# Patient Record
Sex: Female | Born: 1945 | Race: White | Hispanic: No | Smoking: Former smoker
Health system: Southern US, Community
[De-identification: ages and names within clinical notes are randomized; demographics above are authoritative.]

## PROBLEM LIST (undated history)

## (undated) DIAGNOSIS — J40 Bronchitis, not specified as acute or chronic: Secondary | ICD-10-CM

## (undated) DIAGNOSIS — K219 Gastro-esophageal reflux disease without esophagitis: Secondary | ICD-10-CM

## (undated) DIAGNOSIS — H269 Unspecified cataract: Secondary | ICD-10-CM

## (undated) DIAGNOSIS — I10 Essential (primary) hypertension: Secondary | ICD-10-CM

## (undated) DIAGNOSIS — D649 Anemia, unspecified: Secondary | ICD-10-CM

## (undated) DIAGNOSIS — C50919 Malignant neoplasm of unspecified site of unspecified female breast: Secondary | ICD-10-CM

## (undated) DIAGNOSIS — C801 Malignant (primary) neoplasm, unspecified: Secondary | ICD-10-CM

## (undated) DIAGNOSIS — M810 Age-related osteoporosis without current pathological fracture: Secondary | ICD-10-CM

## (undated) DIAGNOSIS — M1990 Unspecified osteoarthritis, unspecified site: Secondary | ICD-10-CM

## (undated) DIAGNOSIS — E785 Hyperlipidemia, unspecified: Secondary | ICD-10-CM

## (undated) HISTORY — DX: Malignant (primary) neoplasm, unspecified: C80.1

## (undated) HISTORY — PX: ESOPHAGOGASTRODUODENOSCOPY: SHX1529

## (undated) HISTORY — PX: OVARIAN CYST REMOVAL: SHX89

## (undated) HISTORY — DX: Hyperlipidemia, unspecified: E78.5

## (undated) HISTORY — PX: TONSILLECTOMY: SUR1361

## (undated) HISTORY — DX: Age-related osteoporosis without current pathological fracture: M81.0

## (undated) HISTORY — DX: Unspecified cataract: H26.9

## (undated) HISTORY — DX: Malignant neoplasm of unspecified site of unspecified female breast: C50.919

## (undated) HISTORY — PX: APPENDECTOMY: SHX54

## (undated) HISTORY — PX: CATARACT EXTRACTION, BILATERAL: SHX1313

---
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
## 1993-10-21 HISTORY — PX: MASTECTOMY: SHX3

## 1995-10-22 HISTORY — PX: BREAST LUMPECTOMY: SHX2

## 2005-12-25 ENCOUNTER — Other Ambulatory Visit: Admission: RE | Admit: 2005-12-25 | Discharge: 2005-12-25 | Payer: Self-pay | Admitting: Obstetrics and Gynecology

## 2005-12-30 ENCOUNTER — Encounter: Admission: RE | Admit: 2005-12-30 | Discharge: 2005-12-30 | Payer: Self-pay | Admitting: Obstetrics and Gynecology

## 2007-01-01 ENCOUNTER — Encounter: Admission: RE | Admit: 2007-01-01 | Discharge: 2007-01-01 | Payer: Self-pay | Admitting: Obstetrics and Gynecology

## 2008-02-09 ENCOUNTER — Encounter: Admission: RE | Admit: 2008-02-09 | Discharge: 2008-02-09 | Payer: Self-pay | Admitting: Obstetrics and Gynecology

## 2009-02-10 ENCOUNTER — Encounter: Admission: RE | Admit: 2009-02-10 | Discharge: 2009-02-10 | Payer: Self-pay | Admitting: Obstetrics and Gynecology

## 2009-03-03 ENCOUNTER — Ambulatory Visit: Payer: Self-pay | Admitting: Obstetrics and Gynecology

## 2009-03-03 ENCOUNTER — Encounter: Payer: Self-pay | Admitting: Obstetrics and Gynecology

## 2009-03-03 ENCOUNTER — Other Ambulatory Visit: Admission: RE | Admit: 2009-03-03 | Discharge: 2009-03-03 | Payer: Self-pay | Admitting: Obstetrics and Gynecology

## 2011-07-24 ENCOUNTER — Other Ambulatory Visit: Payer: Self-pay | Admitting: Obstetrics and Gynecology

## 2011-07-24 DIAGNOSIS — Z9012 Acquired absence of left breast and nipple: Secondary | ICD-10-CM

## 2011-07-24 DIAGNOSIS — Z1231 Encounter for screening mammogram for malignant neoplasm of breast: Secondary | ICD-10-CM

## 2011-08-05 ENCOUNTER — Ambulatory Visit
Admission: RE | Admit: 2011-08-05 | Discharge: 2011-08-05 | Disposition: A | Discharge status: Home / Self Care | Payer: Medicaid Other | Source: Ambulatory Visit | Attending: Obstetrics and Gynecology | Admitting: Obstetrics and Gynecology

## 2011-08-05 DIAGNOSIS — Z1231 Encounter for screening mammogram for malignant neoplasm of breast: Secondary | ICD-10-CM

## 2011-08-05 DIAGNOSIS — Z9012 Acquired absence of left breast and nipple: Secondary | ICD-10-CM

## 2011-08-07 ENCOUNTER — Other Ambulatory Visit: Payer: Self-pay | Admitting: Obstetrics and Gynecology

## 2011-08-07 DIAGNOSIS — R928 Other abnormal and inconclusive findings on diagnostic imaging of breast: Secondary | ICD-10-CM

## 2011-08-23 ENCOUNTER — Ambulatory Visit
Admission: RE | Admit: 2011-08-23 | Discharge: 2011-08-23 | Disposition: A | Discharge status: Home / Self Care | Payer: Medicaid Other | Source: Ambulatory Visit | Attending: Obstetrics and Gynecology | Admitting: Obstetrics and Gynecology

## 2011-08-23 ENCOUNTER — Other Ambulatory Visit: Payer: Self-pay | Admitting: Obstetrics and Gynecology

## 2011-08-23 DIAGNOSIS — R928 Other abnormal and inconclusive findings on diagnostic imaging of breast: Secondary | ICD-10-CM

## 2011-08-27 ENCOUNTER — Ambulatory Visit
Admission: RE | Admit: 2011-08-27 | Discharge: 2011-08-27 | Disposition: A | Discharge status: Home / Self Care | Payer: Medicaid Other | Source: Ambulatory Visit | Attending: Obstetrics and Gynecology | Admitting: Obstetrics and Gynecology

## 2011-08-27 DIAGNOSIS — R928 Other abnormal and inconclusive findings on diagnostic imaging of breast: Secondary | ICD-10-CM

## 2011-08-30 ENCOUNTER — Other Ambulatory Visit: Payer: Self-pay | Admitting: *Deleted

## 2011-08-30 ENCOUNTER — Telehealth: Payer: Self-pay | Admitting: *Deleted

## 2011-08-30 DIAGNOSIS — C50919 Malignant neoplasm of unspecified site of unspecified female breast: Secondary | ICD-10-CM

## 2011-08-30 NOTE — Telephone Encounter (Signed)
Confirmed [facility] appt for 09/04/11.  Gave instructions and contact information to pt if questions should arise.

## 2011-09-04 ENCOUNTER — Ambulatory Visit
Admission: RE | Admit: 2011-09-04 | Discharge: 2011-09-04 | Disposition: A | Discharge status: Home / Self Care | Payer: Medicaid Other | Source: Ambulatory Visit | Attending: Radiation Oncology | Admitting: Radiation Oncology

## 2011-09-04 ENCOUNTER — Ambulatory Visit: Payer: Medicaid Other

## 2011-09-04 ENCOUNTER — Encounter (INDEPENDENT_AMBULATORY_CARE_PROVIDER_SITE_OTHER): Payer: Self-pay | Admitting: Surgery

## 2011-09-04 ENCOUNTER — Encounter: Payer: Self-pay | Admitting: Oncology

## 2011-09-04 ENCOUNTER — Ambulatory Visit (HOSPITAL_BASED_OUTPATIENT_CLINIC_OR_DEPARTMENT_OTHER): Payer: Medicaid Other | Admitting: Oncology

## 2011-09-04 ENCOUNTER — Ambulatory Visit: Payer: Medicare Other | Attending: Surgery | Admitting: Physical Therapy

## 2011-09-04 ENCOUNTER — Ambulatory Visit (HOSPITAL_BASED_OUTPATIENT_CLINIC_OR_DEPARTMENT_OTHER): Payer: Medicaid Other | Admitting: Surgery

## 2011-09-04 ENCOUNTER — Other Ambulatory Visit (HOSPITAL_BASED_OUTPATIENT_CLINIC_OR_DEPARTMENT_OTHER): Payer: Medicaid Other | Admitting: Lab

## 2011-09-04 VITALS — BP 176/99 | HR 73 | Temp 98.1°F | Ht 60.5 in | Wt 154.9 lb

## 2011-09-04 DIAGNOSIS — C50919 Malignant neoplasm of unspecified site of unspecified female breast: Secondary | ICD-10-CM | POA: Insufficient documentation

## 2011-09-04 DIAGNOSIS — Z170 Estrogen receptor positive status [ER+]: Secondary | ICD-10-CM

## 2011-09-04 DIAGNOSIS — R293 Abnormal posture: Secondary | ICD-10-CM | POA: Insufficient documentation

## 2011-09-04 DIAGNOSIS — C50119 Malignant neoplasm of central portion of unspecified female breast: Secondary | ICD-10-CM

## 2011-09-04 DIAGNOSIS — Z1501 Genetic susceptibility to malignant neoplasm of breast: Secondary | ICD-10-CM

## 2011-09-04 DIAGNOSIS — IMO0001 Reserved for inherently not codable concepts without codable children: Secondary | ICD-10-CM | POA: Insufficient documentation

## 2011-09-04 DIAGNOSIS — C50911 Malignant neoplasm of unspecified site of right female breast: Secondary | ICD-10-CM

## 2011-09-04 LAB — COMPREHENSIVE METABOLIC PANEL
ALT: 43 U/L — ABNORMAL HIGH (ref 0–35)
AST: 26 U/L (ref 0–37)
BUN: 16 mg/dL (ref 6–23)
CO2: 24 mEq/L (ref 19–32)
Creatinine, Ser: 0.73 mg/dL (ref 0.50–1.10)
Glucose, Bld: 128 mg/dL — ABNORMAL HIGH (ref 70–99)
Sodium: 138 mEq/L (ref 135–145)
Total Bilirubin: 0.3 mg/dL (ref 0.3–1.2)

## 2011-09-04 LAB — CBC WITH DIFFERENTIAL/PLATELET
Eosinophils Absolute: 0 10*3/uL (ref 0.0–0.5)
HGB: 14.7 g/dL (ref 11.6–15.9)
MCV: 85.8 fL (ref 79.5–101.0)
MONO#: 0.3 10*3/uL (ref 0.1–0.9)
NEUT%: 76.3 % (ref 38.4–76.8)
Platelets: 262 10*3/uL (ref 145–400)
RBC: 4.95 10*6/uL (ref 3.70–5.45)
WBC: 7.5 10*3/uL (ref 3.9–10.3)
lymph#: 1.4 10*3/uL (ref 0.9–3.3)

## 2011-09-04 NOTE — Progress Notes (Signed)
Kendra Schultz is an 65 y.o. female.    Chief Complaint  Patient presents with  . Breast Cancer    HPI: Screening mammogram performed right breast 08/05/2011 showed scattered calcifications. Diagnostic mammogram performed 08/23/2011 showed a 4 mm cluster of calcifications. 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 Invasive component was ER positive at 89%, PR +81% with a proliferative index of 9%. The tumor was negative for HER-2/neu expression with a ratio of 1.73.   History reviewed. No pertinent past medical history. the patient does not have an active primary care Dr. She sees Dr. Eda Paschal for gynecological care. She and her in does perform some basic blood tests. She has not had a colonoscopy or a recent DEXA scan. She is on no active medications apart from multivitamin and some vitamin D. taken  Past Surgical History  Procedure Date  . Breast lumpectomy 1997    right  . 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 This took place in [place]. In 1997 she had a lumpectomy in the right side, and did not having any  adjuvant radiation or chemotherapy.  History reviewed. No pertinent family history. No history of Breat or ovarian cancer, 1 brother with a history of NHL treated at the [facility].  Gynecologic history: G2 P2 and menarche age 18, menopause age 84 with no history of hormone replacement therapy.  Social History:  reports that she has been smoking.  She does not have any smokeless tobacco history on file. She reports that she does not drink alcohol. Her drug history not on file. she has smoked since the age of 65 and her from half to one pack per day. Does not seem alcohol. She has 2 been retarded children ages 55 and 98 she has one sister, mother stools are living at 68. She is originally from [place].  Health  maintenance:  Cholesterol n/a  Bone density n/a  Colonoscopy n/a  (PSA) n/a  (PAP) n/a   Allergies:  Allergies  Allergen Reactions  . Keflex Nausea And Vomiting    No current outpatient prescriptions on file as of 09/04/2011.   No current facility-administered medications on file as of 09/04/2011.    ROS generally feels well. Weight is stable. Appetite good.  Has occasional headaches, some dyspnea on exertion but denies chest denies abdominal pain nausea vomiting or diarrhea  Physical Exam:  Blood pressure 176/99, pulse 73, temperature 98.1 F (36.7 C), height 5' 0.5" (1.537 m), weight 154 lb 14.4 oz (70.262 kg).  Sclerae unicteric Oropharynx clear No peripheral adenopathy Lungs no rales or rhonchi Heart regular rate and rhythm Abd benign MSK no focal spinal tenderness, no peripheral edema Neuro: nonfocal Breasts: Right breast has ecchymoses and a portion of the breast without discrete mass, right axilla negative, status post left mastectomy without evidence of local recurrence CBC Lab Results  Component Value Date   HGB 14.7 09/04/2011   HCT 42.5 09/04/2011   MCV 85.8 09/04/2011   PLT 262 09/04/2011   CMP     Component Value Date/Time   NA 138 09/04/2011 0816   K 3.5 09/04/2011 0816   CL 102 09/04/2011 0816   CO2 24 09/04/2011 0816   GLUCOSE 128* 09/04/2011 0816   BUN 16 09/04/2011 0816   CREATININE 0.73 09/04/2011 0816   CALCIUM 9.7 09/04/2011 0816  PROT 7.7 09/04/2011 0816   ALBUMIN 4.2 09/04/2011 0816   AST 26 09/04/2011 0816   ALT 43* 09/04/2011 0816   ALKPHOS 87 09/04/2011 0816   BILITOT 0.3 09/04/2011 0816     No results found.   Assessment: 66 year old woman with third occurrence of breast cancer now affecting the right breast., This is ER PR positive HER-2 negative. Plan: Patient was seen today by radiation oncology and surgery. Current plan is for this patient to undergo mastectomy. You also had genetic counseling. Based on the results of  the surgery we will discuss with her possible adjuvant treatment the likelihood is that she will not require chemotherapy. An Oncotype test will be done depending on the volume of invasive cancer present . I plan to see her back in followup in approximately a month's time.  [name] MD 09/04/2011, 1:21 PM

## 2011-09-04 NOTE — Progress Notes (Signed)
No chief complaint on file.   HPI Kendra Schultz is a 65 y.o. female.  The patient presents with complaint of right breast cancer.  Pt had right breast pain for a number of weeks prior to mammogram and biopsy.  She denies mass,  Nipple discharge or change to her breast. HPI  No past medical history on file. tobacco abuse  No past surgical history on file.  History of left mastectomy in 1995 History of right breast lumpectomy 1997 no radiation therapy  No family history on file. lymphoma  Social History History  Substance Use Topics  . Smoking status: Not on file  . Smokeless tobacco: Not on file  . Alcohol Use: Not on file    Not on File  No current outpatient prescriptions on file.    Review of Systems Review of Systems  Constitutional: Negative for fever, chills and unexpected weight change.  HENT: Negative for hearing loss, congestion, sore throat, trouble swallowing and voice change.   Eyes: Negative for visual disturbance.  Respiratory: Negative for cough and wheezing.   Cardiovascular: Negative for chest pain, palpitations and leg swelling.  Gastrointestinal: Negative for nausea, vomiting, abdominal pain, diarrhea, constipation, blood in stool, abdominal distention and anal bleeding.  Genitourinary: Negative for hematuria, vaginal bleeding and difficulty urinating.  Musculoskeletal: Negative for arthralgias.  Skin: Negative for rash and wound.  Neurological: Negative for seizures, syncope and headaches.  Hematological: Negative for adenopathy. Does not bruise/bleed easily.  Psychiatric/Behavioral: Negative for confusion.    There were no vitals taken for this visit.  Physical Exam Physical Exam  Constitutional: She is oriented to person, place, and time. She appears well-developed and well-nourished.  HENT:  Head: Normocephalic and atraumatic.  Nose: Nose normal.  Eyes: EOM are normal. Pupils are equal, round, and reactive to light.  Neck: Normal range of  motion. Neck supple.  Cardiovascular: Normal rate, regular rhythm and normal heart sounds.  Exam reveals no gallop and no friction rub.   No murmur heard. Pulmonary/Chest: Effort normal and breath sounds normal. No stridor.       Left breast surgically absent.  No mass.Right breast with scar adjacent to nipple. Mass under right nipple.  No axillary adenopathy bilaterally.  Abdominal: Soft. Normal appearance.  Lymphadenopathy:    She has no cervical adenopathy.  Neurological: She is alert and oriented to person, place, and time. No cranial nerve deficit. GCS eye subscore is 4. GCS verbal subscore is 5. GCS motor subscore is 6.  Skin: Skin is warm and dry.       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  She has no interest in reconstruction at this time.  Discussed smoking cessation.  Breast conservation discussed as well. The surgical and non surgical options have been discussed with the patient.  Risks of surgery include bleeding,  Infection,  Flap necrosis,  Tissue loss,  Chronic pain, death, Numbness,  And the need for additional procedures.  Reconstruction options also have been discussed with the patient as well.  The patient agrees to proceed.Sentinel lymph node mapping and dissection has been discussed with the patient.  Risk of bleeding,  Infection,  Seroma formation,  Additional procedures,,    Shoulder weakness ,  Shoulder stiffness,  Nerve and blood vessel injury and reaction to the mapping dyes have been discussed.  Alternatives to surgery have been discussed with the patient.  The patient agrees to proceed.       Kentarius Partington A. 09/04/2011, 9:31 AM    

## 2011-09-04 NOTE — Patient Instructions (Signed)
Mastectomy, With or Without Reconstruction Mastectomy (removal of the breast) is a procedure most commonly used to treat cancer (tumor) of the breast. Different procedures are available for treatment. This depends on the stage of the tumor (abnormal growths). Discuss this with your caregiver, surgeon (a specialist for performing operations such as this), or oncologist (someone specialized in the treatment of cancer). With proper information, you can decide which treatment is best for you. Although the sound of the word cancer is frightening to all of us, the new treatments and medications can be a source of reassurance and comfort. If there are things you are worried about, discuss them with your caregiver. He or she can help comfort you and your family. Some of the different procedures for treating breast cancer are:  Radical (extensive) mastectomy. This is an operation used to remove the entire breast, the muscles under the breast, and all of the glands (lymph nodes) under the arm. With all of the new treatments available for cancer of the breast, this procedure has become less common.   Modified radical mastectomy. This is a similar operation to the radical mastectomy described above. In the modified radical mastectomy, the muscles of the chest wall are not removed unless one of the lessor muscles is removed. One of the lessor muscles may be removed to allow better removal of the lymph nodes. The axillary lymph nodes are also removed. Rarely, during an axillary node dissection nerves to this area are damaged. Radiation therapy is then often used to the area following this surgery.   A total mastectomy also known as a complete or simple mastectomy. It involves removal of only the breast. The lymph nodes and the muscles are left in place.   In a lumpectomy, the lump is removed from the breast. This is the simplest form of surgical treatment. A sentinel lymph node biopsy may also be done. Additional  treatment may be required.  RISKS AND COMPLICATIONS The main problems that follow removal of the breast include:  Infection (germs start growing in the wound). This can usually be treated with antibiotics (medications that kill germs).   Lymphedema. This means the arm on the side of the breast that was operated on swells because the lymph (tissue fluid) cannot follow the main channels back into the body. This only occurs when the lymph nodes have had to be removed under the arm.   There may be some areas of numbness to the upper arm and around the incision (cut by the surgeon) in the breast. This happens because of the cutting of or damage to some of the nerves in the area. This is most often unavoidable.   There may be difficultymoving the arm in a full range of motion (moving in all directions) following surgery. This usually improves with time following use and exercise.   Recurrence of breast cancer may happen with the very best of surgery and follow up treatment. Sometimes small cancer cells that cannot be seen with the naked eye have already spread at the time of surgery. When this happens other treatment is available. This treatment may be radiation, medications or a combination of both.  RECONSTRUCTION Reconstruction of the breast may be done immediately if there is not going to be post-operative radiation. This surgery is done for cosmetic (improve appearance) purposes to improve the physical appearance after the operation. This may be done in two ways:  It can be done using a saline filled prosthetic (an artificial breast which is filled   with salt water). Silicone breast implants are now re-approved by the FDA and are being commonly used.   Reconstruction can be done using the body's own muscle/fat/skin.  Your caregiver will discuss your options with you. Depending upon your needs or choice, together you will be able to determine which procedure is best for you. Document Released:  07/02/2001 Document Revised: 06/19/2011 Document Reviewed: 02/23/2008 Exit[employer] Patient Information 2012 [employer]. 

## 2011-09-05 ENCOUNTER — Other Ambulatory Visit (INDEPENDENT_AMBULATORY_CARE_PROVIDER_SITE_OTHER): Payer: Self-pay | Admitting: Surgery

## 2011-09-05 ENCOUNTER — Encounter: Payer: Self-pay | Admitting: *Deleted

## 2011-09-05 DIAGNOSIS — C50911 Malignant neoplasm of unspecified site of right female breast: Secondary | ICD-10-CM

## 2011-09-05 NOTE — Pre-Procedure Instructions (Signed)
20 Kendra Schultz  09/05/2011   Your procedure is scheduled on:09-17-2011  Report to [facility] at 10:15AM.  Call this number if you have problems the morning of surgery: [phone]   Remember:   Do not eat food:After Midnight.  Do not drink clear liquids: 4 Hours before arrival. MAY HAVE WATER,SODA,TEA,BROTH,APPLE JUICE, GRAPE JUICE UNTIL 6:15 AM  Take these medicines the morning of surgery with A SIP OF WATER: NONE   Do not wear jewelry, make-up or nail polish.  Do not wear lotions, powders, or perfumes. You may wear deodorant.  Do not shave 48 hours prior to surgery.  Do not bring valuables to the hospital.  Contacts, dentures or bridgework may not be worn into surgery.  Leave suitcase in the car. After surgery it may be brought to your room.  For patients admitted to the hospital, checkout time is 11:00 AM the day of discharge.   Patients discharged the day of surgery will not be allowed to drive home.  Name and phone number of your driver:  Special Instructions: CHG Shower Use Special Wash: 1/2 bottle night before surgery and 1/2 bottle morning of surgery.   Please read over the following fact sheets that you were given: Pain Booklet, MRSA Information and Surgical Site Infection Prevention

## 2011-09-05 NOTE — Pre-Procedure Instructions (Signed)
20 Kendra Schultz  09/05/2011   Your procedure is scheduled on: 09-17-2011  Report to [facility] at 10:15 AM  Call this number if you have problems the morning of surgery: [phone]   Remember:   Do not eat food:After Midnight.  Do not drink clear liquids: 4 Hours before arrival.  Take these medicines the morning of surgery with A SIP OF WATER: none   Do not wear jewelry, make-up or nail polish.  Do not wear lotions, powders, or perfumes. You may wear deodorant.  Do not shave 48 hours prior to surgery.  Do not bring valuables to the hospital.  Contacts, dentures or bridgework may not be worn into surgery.  Leave suitcase in the car. After surgery it may be brought to your room.  For patients admitted to the hospital, checkout time is 11:00 AM the day of discharge.   Patients discharged the day of surgery will not be allowed to drive home.  Name and phone number of your driver:   Special Instructions: CHG Shower Use Special Wash: 1/2 bottle night before surgery and 1/2 bottle morning of surgery.   Please read over the following fact sheets that you were given: Pain Booklet, MRSA Information and Surgical Site Infection Prevention

## 2011-09-05 NOTE — Progress Notes (Signed)
Mailed after appt letter to pt. 

## 2011-09-06 ENCOUNTER — Other Ambulatory Visit (HOSPITAL_COMMUNITY): Payer: Medicaid Other

## 2011-09-06 ENCOUNTER — Other Ambulatory Visit (INDEPENDENT_AMBULATORY_CARE_PROVIDER_SITE_OTHER): Payer: Self-pay | Admitting: Surgery

## 2011-09-06 ENCOUNTER — Encounter (HOSPITAL_COMMUNITY)
Admission: RE | Admit: 2011-09-06 | Discharge: 2011-09-06 | Disposition: A | Discharge status: Home / Self Care | Payer: Medicare Other | Source: Ambulatory Visit | Attending: Surgery | Admitting: Surgery

## 2011-09-06 ENCOUNTER — Encounter (HOSPITAL_COMMUNITY): Payer: Self-pay

## 2011-09-06 ENCOUNTER — Encounter: Payer: Self-pay | Admitting: *Deleted

## 2011-09-06 ENCOUNTER — Ambulatory Visit: Payer: Medicare Other

## 2011-09-06 ENCOUNTER — Other Ambulatory Visit: Payer: Self-pay

## 2011-09-06 HISTORY — DX: Gastro-esophageal reflux disease without esophagitis: K21.9

## 2011-09-06 HISTORY — DX: Essential (primary) hypertension: I10

## 2011-09-06 HISTORY — DX: Unspecified osteoarthritis, unspecified site: M19.90

## 2011-09-06 HISTORY — DX: Anemia, unspecified: D64.9

## 2011-09-06 LAB — COMPREHENSIVE METABOLIC PANEL
ALT: 36 U/L — ABNORMAL HIGH (ref 0–35)
AST: 22 U/L (ref 0–37)
Albumin: 4.2 g/dL (ref 3.5–5.2)
Alkaline Phosphatase: 87 U/L (ref 39–117)
BUN: 17 mg/dL (ref 6–23)
Chloride: 106 mEq/L (ref 96–112)
Potassium: 3.9 mEq/L (ref 3.5–5.1)
Sodium: 143 mEq/L (ref 135–145)
Total Bilirubin: 0.4 mg/dL (ref 0.3–1.2)
Total Protein: 7.6 g/dL (ref 6.0–8.3)

## 2011-09-06 LAB — SURGICAL PCR SCREEN: Staphylococcus aureus: NEGATIVE

## 2011-09-06 LAB — DIFFERENTIAL
Basophils Relative: 0 % (ref 0–1)
Eosinophils Absolute: 0.1 10*3/uL (ref 0.0–0.7)
Eosinophils Relative: 1 % (ref 0–5)
Lymphs Abs: 1.8 10*3/uL (ref 0.7–4.0)
Monocytes Absolute: 0.5 10*3/uL (ref 0.1–1.0)
Monocytes Relative: 6 % (ref 3–12)
Neutrophils Relative %: 69 % (ref 43–77)

## 2011-09-06 LAB — CBC
HCT: 44.6 % (ref 36.0–46.0)
MCHC: 33.9 g/dL (ref 30.0–36.0)
RDW: 13.7 % (ref 11.5–15.5)
WBC: 8 10*3/uL (ref 4.0–10.5)

## 2011-09-06 IMAGING — CR DG CHEST 2V
2 series · 2 of 2 positions shown · non-contrast
Comparison: None.

CLINICAL DATA: Preoperative respiratory films for patient with
breast cancer.  Smoker.

CHEST - 2 VIEW

[view not recorded (1 of 2)]
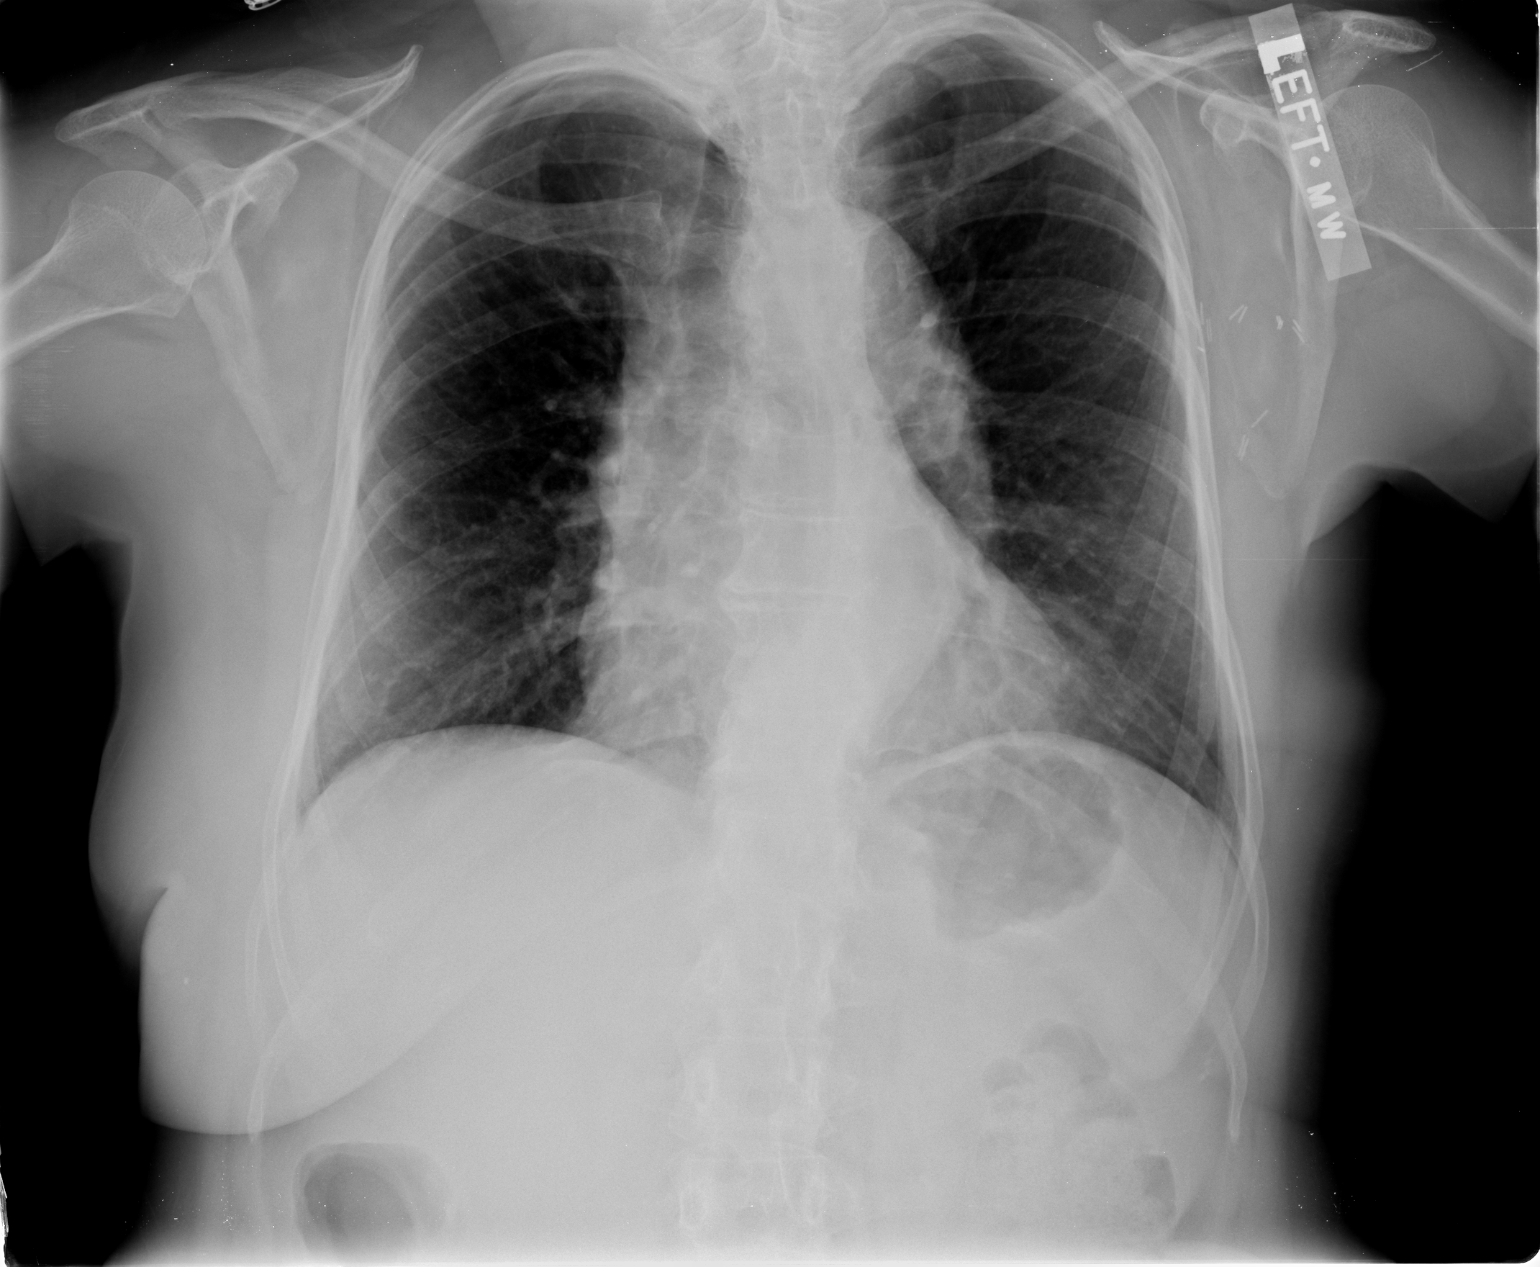

[view not recorded (2 of 2)]
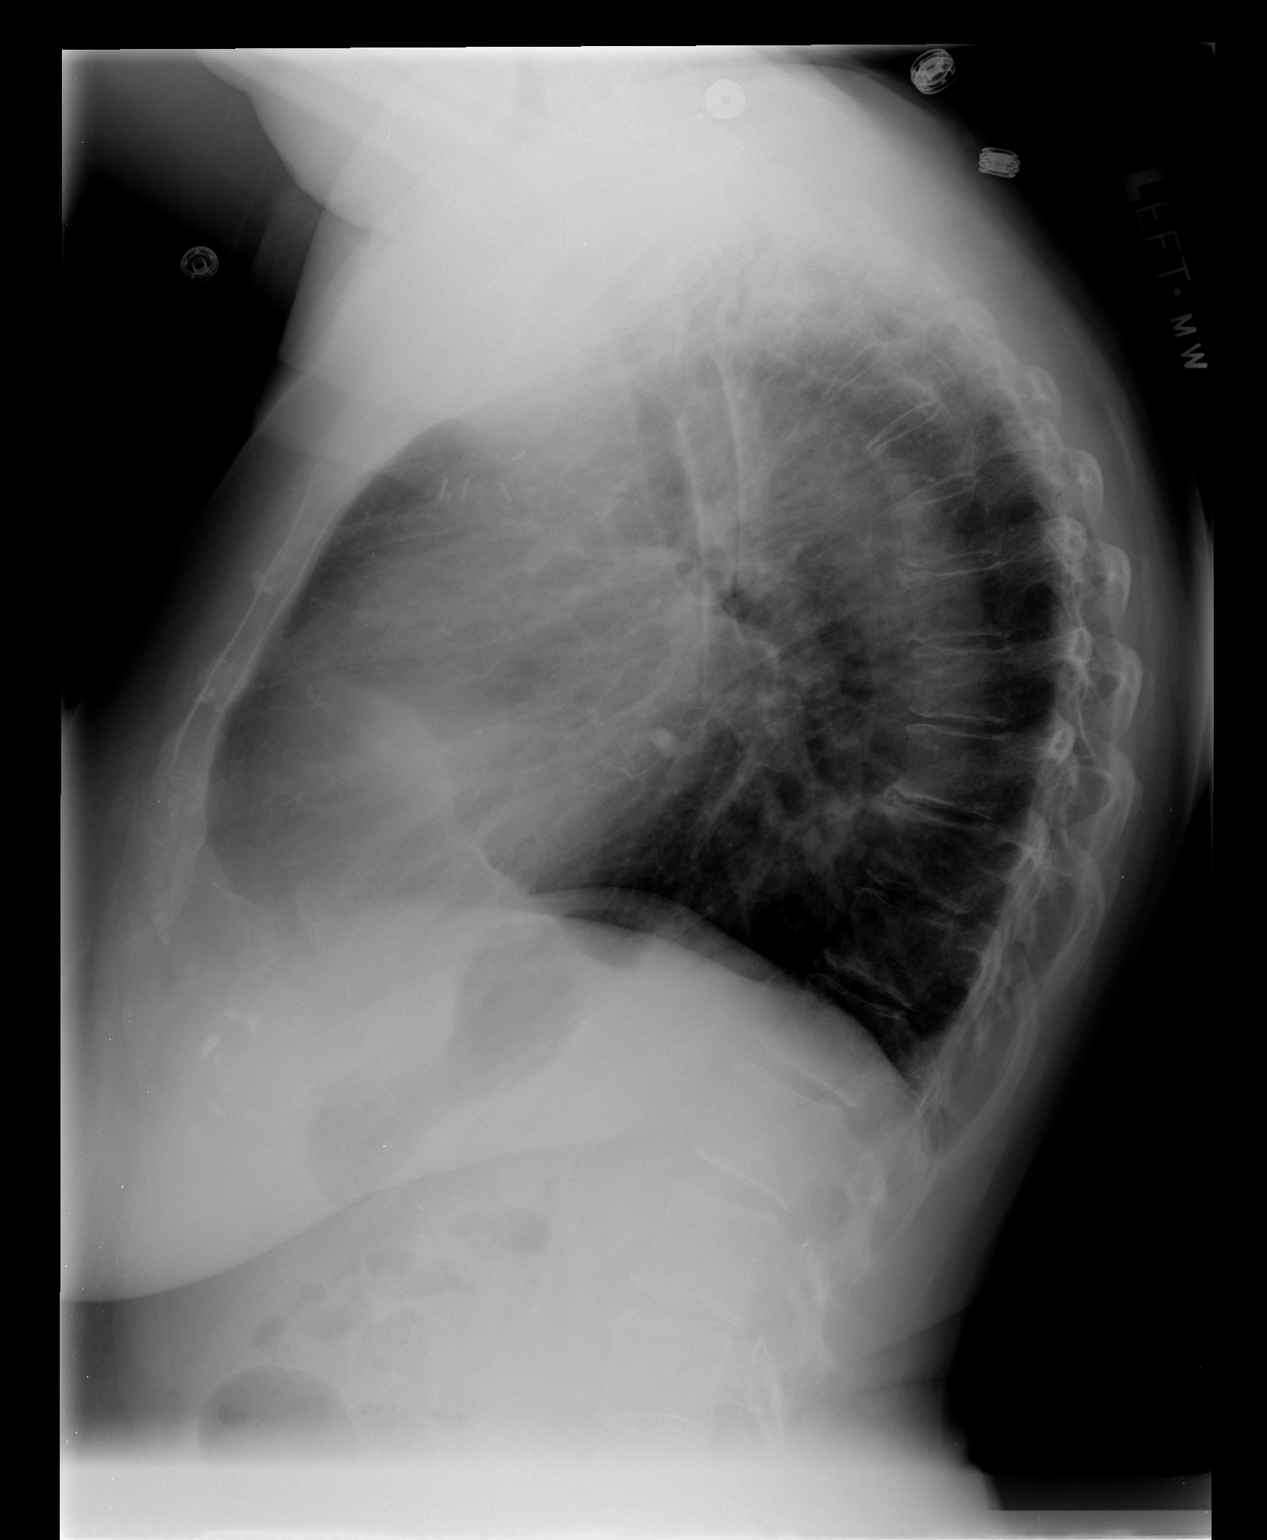

[2 of 2 positions shown; findings below may reference images not displayed]

FINDINGS: The patient is status post left mastectomy and axillary
dissection.  The lungs are clear.  Heart size is normal.  No
pneumothorax or pleural effusion.
IMPRESSION: No acute finding.

## 2011-09-06 NOTE — Progress Notes (Signed)
Pt. bp elevated on pre admission visit  164/104  And 152/100 does not have a pcp and is not being treated for HTN>

## 2011-09-06 NOTE — Progress Notes (Signed)
ORDER FOR SENTINEL NODE INJECTION RELEASED BY MISTAKE. DR. Luisa Hart NOTIFIED AND STATED HE WOULD RE-ENTER THE ORDER

## 2011-09-06 NOTE — Progress Notes (Signed)
DR. Luisa Hart  MADE AWARE OF ELEVATED BP, ASK THAT ANESTHESIA REVIEW.

## 2011-09-06 NOTE — Progress Notes (Signed)
Pt. Seen for genetic counseling.  Blood drawn for BRCA 1/2.  Does not have Medicare card yet.  Will FAX me the letter saying she is eligible.  Waiting on that will delay the lab's beginning the test.  Results may not be available by the time of her scheduled mastectomy, Nov 27.

## 2011-09-09 ENCOUNTER — Telehealth: Payer: Self-pay | Admitting: *Deleted

## 2011-09-09 NOTE — Consult Note (Signed)
Anesthesia:  Patient is a 65 year old female for a right simple mastectomy.  Other hx includes GERD, HTN, OA, anemia, and smoking.  She had a PAT visit on 09/06/11.  Her BP was 164/104 and 152/100.  She told her PAT RN that she did not currently have a PCP and was not on any anti-hypertensive agents.  Dr. Luisa Hart was made aware, and asked if I would review her chart.  Her EKG showed LVH but no significant ST abnormalities.  Labs and CXR where both acceptable from an anesthesia standpoint.  I reviewed above with Dr. Chaney Malling.  If Ms. Symmonds presents on the day of surgery with a similar BP reading and is asymptomatic, it is likely that she may proceed to the OR; however, since her surgery is not scheduled until 09/17/11, we are recommending that an effort be made to get her into a PCP for further evaluation and management of her untreated HTN.  If her BP is significantly higher preoperatively this could potentially lead to a delay or cancellation of the procedure. I did send Dr. Luisa Hart a message relaying this via Epic email.  I will attempt to contact the patient next week.

## 2011-09-09 NOTE — Consult Note (Signed)
Anesthesia:  See my consult note from 09/06/11.  I spoke with her today.  She confirms she has no PCP.  She has had problems getting in to a PCP earlier b/c she was on Medicaid only.  She is now on Medicare as well.  She feels her BP does not usually run quite as high as it had during her PAT visit.  She does deny CP, SOB, or significant edema.  She is not very active, but can vacuum without difficulty.  I did discuss the importance of BP management in order to reduce risk of CVA, CAD, and CKD.  She was given the phone number of [facility] and encouraged to f/u with Dr. Luisa Hart if she needs further assistance in establishing a PCP.

## 2011-09-09 NOTE — Telephone Encounter (Signed)
Spoke to pt concerning [facility] from 09/04/11.  Pt denies questions or needs at this time.  Encourage pt to call with concerns.  Received verbal understanding.  Contact information given.

## 2011-09-16 MED ORDER — CIPROFLOXACIN IN D5W 400 MG/200ML IV SOLN
400.0000 mg | INTRAVENOUS | Status: AC
  Administered 2011-09-17: 400 mg via INTRAVENOUS
  Filled 2011-09-16 (×2): qty 200

## 2011-09-16 NOTE — Interval H&P Note (Signed)
History and Physical Interval Note:   09/16/2011   9:49 AM   Kendra Schultz  has presented today for surgery, with the diagnosis of right breast cancer   The various methods of treatment have been discussed with the patient and family. After consideration of risks, benefits and other options for treatment, the patient has consented to  Procedure(s): MASTECTOMY WITH SENTINEL LYMPH NODE BIOPSY as a surgical intervention .  The patients' history has been reviewed, patient examined, no change in status, stable for surgery.  I have reviewed the patients' chart and labs.  Questions were answered to the patient's satisfaction.     Angelik Walls A.  MD

## 2011-09-16 NOTE — H&P (View-Only) (Signed)
No chief complaint on file.   HPI Kendra Schultz is a 65 y.o. female.  The patient presents with complaint of right breast cancer.  Pt had right breast pain for a number of weeks prior to mammogram and biopsy.  She denies mass,  Nipple discharge or change to her breast. HPI  No past medical history on file. tobacco abuse  No past surgical history on file.  History of left mastectomy in 1995 History of right breast lumpectomy 1997 no radiation therapy  No family history on file. lymphoma  Social History History  Substance Use Topics  . Smoking status: Not on file  . Smokeless tobacco: Not on file  . Alcohol Use: Not on file    Not on File  No current outpatient prescriptions on file.    Review of Systems Review of Systems  Constitutional: Negative for fever, chills and unexpected weight change.  HENT: Negative for hearing loss, congestion, sore throat, trouble swallowing and voice change.   Eyes: Negative for visual disturbance.  Respiratory: Negative for cough and wheezing.   Cardiovascular: Negative for chest pain, palpitations and leg swelling.  Gastrointestinal: Negative for nausea, vomiting, abdominal pain, diarrhea, constipation, blood in stool, abdominal distention and anal bleeding.  Genitourinary: Negative for hematuria, vaginal bleeding and difficulty urinating.  Musculoskeletal: Negative for arthralgias.  Skin: Negative for rash and wound.  Neurological: Negative for seizures, syncope and headaches.  Hematological: Negative for adenopathy. Does not bruise/bleed easily.  Psychiatric/Behavioral: Negative for confusion.    There were no vitals taken for this visit.  Physical Exam Physical Exam  Constitutional: She is oriented to person, place, and time. She appears well-developed and well-nourished.  HENT:  Head: Normocephalic and atraumatic.  Nose: Nose normal.  Eyes: EOM are normal. Pupils are equal, round, and reactive to light.  Neck: Normal range of  motion. Neck supple.  Cardiovascular: Normal rate, regular rhythm and normal heart sounds.  Exam reveals no gallop and no friction rub.   No murmur heard. Pulmonary/Chest: Effort normal and breath sounds normal. No stridor.       Left breast surgically absent.  No mass.Right breast with scar adjacent to nipple. Mass under right nipple.  No axillary adenopathy bilaterally.  Abdominal: Soft. Normal appearance.  Lymphadenopathy:    She has no cervical adenopathy.  Neurological: She is alert and oriented to person, place, and time. No cranial nerve deficit. GCS eye subscore is 4. GCS verbal subscore is 5. GCS motor subscore is 6.  Skin: Skin is warm and dry.       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  She has no interest in reconstruction at this time.  Discussed smoking cessation.  Breast conservation discussed as well. The surgical and non surgical options have been discussed with the patient.  Risks of surgery include bleeding,  Infection,  Flap necrosis,  Tissue loss,  Chronic pain, death, Numbness,  And the need for additional procedures.  Reconstruction options also have been discussed with the patient as well.  The patient agrees to proceed.Sentinel lymph node mapping and dissection has been discussed with the patient.  Risk of bleeding,  Infection,  Seroma formation,  Additional procedures,,  Shoulder weakness ,  Shoulder stiffness,  Nerve and blood vessel injury and reaction to the mapping dyes have been discussed.  Alternatives to surgery have been discussed with the patient.  The patient agrees to proceed.       Murriel Holwerda A. 09/04/2011, 9:31 AM

## 2011-09-17 ENCOUNTER — Encounter (HOSPITAL_COMMUNITY)
Admission: RE | Disposition: A | Discharge status: Home / Self Care | Payer: Self-pay | Source: Ambulatory Visit | Attending: Surgery

## 2011-09-17 ENCOUNTER — Encounter (HOSPITAL_COMMUNITY): Payer: Self-pay | Admitting: Surgery

## 2011-09-17 ENCOUNTER — Other Ambulatory Visit (INDEPENDENT_AMBULATORY_CARE_PROVIDER_SITE_OTHER): Payer: Self-pay | Admitting: Surgery

## 2011-09-17 ENCOUNTER — Inpatient Hospital Stay (HOSPITAL_COMMUNITY)
Admission: RE | Admit: 2011-09-17 | Discharge: 2011-09-18 | DRG: 583 | Disposition: A | Discharge status: Home / Self Care | Payer: Medicare Other | Source: Ambulatory Visit | Attending: Surgery | Admitting: Surgery

## 2011-09-17 ENCOUNTER — Encounter (HOSPITAL_COMMUNITY): Payer: Self-pay | Admitting: Vascular Surgery

## 2011-09-17 ENCOUNTER — Inpatient Hospital Stay (HOSPITAL_COMMUNITY): Admission: RE | Admit: 2011-09-17 | Payer: Medicaid Other | Source: Ambulatory Visit

## 2011-09-17 ENCOUNTER — Ambulatory Visit (HOSPITAL_COMMUNITY): Payer: Medicare Other

## 2011-09-17 ENCOUNTER — Encounter (HOSPITAL_COMMUNITY): Payer: Self-pay | Admitting: *Deleted

## 2011-09-17 ENCOUNTER — Ambulatory Visit (HOSPITAL_COMMUNITY): Payer: Medicare Other | Admitting: Vascular Surgery

## 2011-09-17 DIAGNOSIS — Z23 Encounter for immunization: Secondary | ICD-10-CM

## 2011-09-17 DIAGNOSIS — Z01812 Encounter for preprocedural laboratory examination: Secondary | ICD-10-CM

## 2011-09-17 DIAGNOSIS — Z853 Personal history of malignant neoplasm of breast: Secondary | ICD-10-CM

## 2011-09-17 DIAGNOSIS — C50919 Malignant neoplasm of unspecified site of unspecified female breast: Principal | ICD-10-CM | POA: Diagnosis present

## 2011-09-17 DIAGNOSIS — D0590 Unspecified type of carcinoma in situ of unspecified breast: Secondary | ICD-10-CM

## 2011-09-17 HISTORY — PX: MASTECTOMY W/ SENTINEL NODE BIOPSY: SHX2001

## 2011-09-17 LAB — CBC
MCH: 28.6 pg (ref 26.0–34.0)
Platelets: 196 10*3/uL (ref 150–400)
RBC: 3.81 MIL/uL — ABNORMAL LOW (ref 3.87–5.11)
RDW: 13.7 % (ref 11.5–15.5)

## 2011-09-17 LAB — CREATININE, SERUM: Creatinine, Ser: 0.78 mg/dL (ref 0.50–1.10)

## 2011-09-17 SURGERY — MASTECTOMY WITH SENTINEL LYMPH NODE BIOPSY
Anesthesia: General | Site: Breast | Laterality: Right | Wound class: Clean

## 2011-09-17 MED ORDER — NEOSTIGMINE METHYLSULFATE 1 MG/ML IJ SOLN
INTRAMUSCULAR | Status: DC | PRN
  Administered 2011-09-17: 4 mg via INTRAVENOUS

## 2011-09-17 MED ORDER — HETASTARCH-ELECTROLYTES 6 % IV SOLN
INTRAVENOUS | Status: DC | PRN
  Administered 2011-09-17: 13:00:00 via INTRAVENOUS

## 2011-09-17 MED ORDER — LISINOPRIL 10 MG PO TABS
10.0000 mg | ORAL_TABLET | Freq: Every day | ORAL | Status: DC
  Filled 2011-09-17 (×2): qty 1

## 2011-09-17 MED ORDER — KETOROLAC TROMETHAMINE 15 MG/ML IJ SOLN
15.0000 mg | Freq: Four times a day (QID) | INTRAMUSCULAR | Status: DC | PRN
  Administered 2011-09-17 – 2011-09-18 (×2): 15 mg via INTRAVENOUS
  Filled 2011-09-17: qty 1

## 2011-09-17 MED ORDER — TECHNETIUM TC 99M SULFUR COLLOID FILTERED
1.0000 | Freq: Once | INTRAVENOUS | Status: AC | PRN
  Administered 2011-09-17: 1 via INTRADERMAL

## 2011-09-17 MED ORDER — ONDANSETRON HCL 4 MG PO TABS: 4.0000 mg | ORAL_TABLET | Freq: Four times a day (QID) | ORAL | Status: DC | PRN

## 2011-09-17 MED ORDER — HYDROMORPHONE HCL PF 1 MG/ML IJ SOLN
0.2500 mg | INTRAMUSCULAR | Status: DC | PRN
  Administered 2011-09-17 (×2): 0.5 mg via INTRAVENOUS

## 2011-09-17 MED ORDER — FENTANYL CITRATE 0.05 MG/ML IJ SOLN
INTRAMUSCULAR | Status: DC | PRN
  Administered 2011-09-17 (×2): 50 ug via INTRAVENOUS
  Administered 2011-09-17: 150 ug via INTRAVENOUS
  Administered 2011-09-17 (×2): 50 ug via INTRAVENOUS

## 2011-09-17 MED ORDER — LIDOCAINE HCL (CARDIAC) 20 MG/ML IV SOLN
INTRAVENOUS | Status: DC | PRN
  Administered 2011-09-17: 50 mg via INTRAVENOUS

## 2011-09-17 MED ORDER — ONDANSETRON HCL 4 MG/2ML IJ SOLN
INTRAMUSCULAR | Status: DC | PRN
  Administered 2011-09-17: 4 mg via INTRAVENOUS

## 2011-09-17 MED ORDER — MORPHINE SULFATE 2 MG/ML IJ SOLN
2.0000 mg | INTRAMUSCULAR | Status: DC | PRN
  Administered 2011-09-17: 2 mg via INTRAVENOUS
  Filled 2011-09-17: qty 1

## 2011-09-17 MED ORDER — MEPERIDINE HCL 25 MG/ML IJ SOLN: 6.2500 mg | INTRAMUSCULAR | Status: DC | PRN

## 2011-09-17 MED ORDER — SODIUM CHLORIDE 0.9 % IJ SOLN
INTRAMUSCULAR | Status: DC | PRN
  Administered 2011-09-17: 13:00:00 via INTRADERMAL

## 2011-09-17 MED ORDER — SODIUM CHLORIDE 0.9 % IR SOLN
Status: DC | PRN
  Administered 2011-09-17: 1000 mL

## 2011-09-17 MED ORDER — KCL IN DEXTROSE-NACL 20-5-0.45 MEQ/L-%-% IV SOLN
INTRAVENOUS | Status: DC
  Administered 2011-09-17: 18:00:00 via INTRAVENOUS
  Filled 2011-09-17 (×2): qty 1000

## 2011-09-17 MED ORDER — HYDROCHLOROTHIAZIDE 12.5 MG PO CAPS
12.5000 mg | ORAL_CAPSULE | Freq: Every day | ORAL | Status: DC
  Filled 2011-09-17 (×2): qty 1

## 2011-09-17 MED ORDER — LISINOPRIL-HYDROCHLOROTHIAZIDE 10-12.5 MG PO TABS: 1.0000 | ORAL_TABLET | Freq: Every day | ORAL | Status: DC

## 2011-09-17 MED ORDER — GLYCOPYRROLATE 0.2 MG/ML IJ SOLN
INTRAMUSCULAR | Status: DC | PRN
  Administered 2011-09-17: .7 mg via INTRAVENOUS
  Administered 2011-09-17: 0.2 mg via INTRAVENOUS

## 2011-09-17 MED ORDER — PNEUMOCOCCAL VAC POLYVALENT 25 MCG/0.5ML IJ INJ
0.5000 mL | INJECTION | INTRAMUSCULAR | Status: DC
  Filled 2011-09-17: qty 0.5

## 2011-09-17 MED ORDER — ONDANSETRON HCL 4 MG/2ML IJ SOLN: 4.0000 mg | Freq: Once | INTRAMUSCULAR | Status: DC | PRN

## 2011-09-17 MED ORDER — LACTATED RINGERS IV SOLN
INTRAVENOUS | Status: DC | PRN
  Administered 2011-09-17 (×2): via INTRAVENOUS

## 2011-09-17 MED ORDER — PROPOFOL 10 MG/ML IV EMUL
INTRAVENOUS | Status: DC | PRN
  Administered 2011-09-17: 200 mg via INTRAVENOUS

## 2011-09-17 MED ORDER — MORPHINE SULFATE 2 MG/ML IJ SOLN: 0.0500 mg/kg | INTRAMUSCULAR | Status: DC | PRN

## 2011-09-17 MED ORDER — ROCURONIUM BROMIDE 100 MG/10ML IV SOLN
INTRAVENOUS | Status: DC | PRN
  Administered 2011-09-17: 35 mg via INTRAVENOUS

## 2011-09-17 MED ORDER — OXYCODONE-ACETAMINOPHEN 5-325 MG PO TABS
2.0000 | ORAL_TABLET | ORAL | Status: DC | PRN
  Administered 2011-09-17 – 2011-09-18 (×2): 2 via ORAL
  Filled 2011-09-17 (×2): qty 2

## 2011-09-17 MED ORDER — ONDANSETRON HCL 4 MG/2ML IJ SOLN: 4.0000 mg | Freq: Four times a day (QID) | INTRAMUSCULAR | Status: DC | PRN

## 2011-09-17 MED ORDER — ENOXAPARIN SODIUM 40 MG/0.4ML ~~LOC~~ SOLN
40.0000 mg | SUBCUTANEOUS | Status: DC
  Administered 2011-09-17: 40 mg via SUBCUTANEOUS
  Filled 2011-09-17 (×2): qty 0.4

## 2011-09-17 SURGICAL SUPPLY — 50 items
ADH SKN CLS APL DERMABOND .7 (GAUZE/BANDAGES/DRESSINGS) ×2
APPLIER CLIP 9.375 MED OPEN (MISCELLANEOUS)
APR CLP MED 9.3 20 MLT OPN (MISCELLANEOUS)
BINDER BREAST LRG (GAUZE/BANDAGES/DRESSINGS) IMPLANT
BINDER BREAST XLRG (GAUZE/BANDAGES/DRESSINGS) ×1 IMPLANT
CANISTER SUCTION 2500CC (MISCELLANEOUS) ×2 IMPLANT
CHLORAPREP W/TINT 26ML (MISCELLANEOUS) ×2 IMPLANT
CLIP APPLIE 9.375 MED OPEN (MISCELLANEOUS) IMPLANT
CLOTH BEACON ORANGE TIMEOUT ST (SAFETY) ×2 IMPLANT
CONT SPEC 4OZ CLIKSEAL STRL BL (MISCELLANEOUS) ×2 IMPLANT
COVER PROBE W GEL 5X96 (DRAPES) ×2 IMPLANT
COVER SURGICAL LIGHT HANDLE (MISCELLANEOUS) ×3 IMPLANT
DERMABOND ADVANCED (GAUZE/BANDAGES/DRESSINGS) ×2
DERMABOND ADVANCED .7 DNX12 (GAUZE/BANDAGES/DRESSINGS) ×1 IMPLANT
DRAIN CHANNEL 19F RND (DRAIN) ×2 IMPLANT
DRAPE LAPAROSCOPIC ABDOMINAL (DRAPES) ×2 IMPLANT
DRAPE UTILITY 15X26 W/TAPE STR (DRAPE) ×4 IMPLANT
ELECT CAUTERY BLADE 6.4 (BLADE) ×2 IMPLANT
ELECT REM PT RETURN 9FT ADLT (ELECTROSURGICAL) ×2
ELECTRODE REM PT RTRN 9FT ADLT (ELECTROSURGICAL) ×1 IMPLANT
EVACUATOR SILICONE 100CC (DRAIN) ×2 IMPLANT
GAUZE SPONGE 2X2 8PLY STRL LF (GAUZE/BANDAGES/DRESSINGS) IMPLANT
GLOVE BIO SURGEON STRL SZ7 (GLOVE) ×2 IMPLANT
GLOVE BIO SURGEON STRL SZ8 (GLOVE) ×2 IMPLANT
GLOVE BIOGEL PI IND STRL 8 (GLOVE) ×1 IMPLANT
GLOVE BIOGEL PI INDICATOR 8 (GLOVE) ×1
GLOVE SS BIOGEL STRL SZ 7 (GLOVE) IMPLANT
GLOVE SUPERSENSE BIOGEL SZ 7 (GLOVE) ×1
GOWN PREVENTION PLUS XLARGE (GOWN DISPOSABLE) ×2 IMPLANT
GOWN STRL NON-REIN LRG LVL3 (GOWN DISPOSABLE) ×4 IMPLANT
KIT BASIN OR (CUSTOM PROCEDURE TRAY) ×2 IMPLANT
KIT ROOM TURNOVER OR (KITS) ×2 IMPLANT
NDL 18GX1X1/2 (RX/OR ONLY) (NEEDLE) ×1 IMPLANT
NDL HYPO 25GX1X1/2 BEV (NEEDLE) ×1 IMPLANT
NEEDLE 18GX1X1/2 (RX/OR ONLY) (NEEDLE) ×2 IMPLANT
NEEDLE HYPO 25GX1X1/2 BEV (NEEDLE) ×2 IMPLANT
NS IRRIG 1000ML POUR BTL (IV SOLUTION) ×2 IMPLANT
PACK GENERAL/GYN (CUSTOM PROCEDURE TRAY) ×2 IMPLANT
PAD ARMBOARD 7.5X6 YLW CONV (MISCELLANEOUS) ×2 IMPLANT
PEN SKIN MARKING BROAD (MISCELLANEOUS) ×2 IMPLANT
SPECIMEN JAR LARGE (MISCELLANEOUS) ×2 IMPLANT
SPONGE GAUZE 2X2 STER 10/PKG (GAUZE/BANDAGES/DRESSINGS) ×1
STAPLER VISISTAT 35W (STAPLE) ×2 IMPLANT
SUT ETHILON 3 0 FSL (SUTURE) ×2 IMPLANT
SUT MNCRL AB 4-0 PS2 18 (SUTURE) ×3 IMPLANT
SUT VIC AB 3-0 SH 18 (SUTURE) ×2 IMPLANT
SYR CONTROL 10ML LL (SYRINGE) ×2 IMPLANT
TOWEL OR 17X24 6PK STRL BLUE (TOWEL DISPOSABLE) ×2 IMPLANT
TOWEL OR 17X26 10 PK STRL BLUE (TOWEL DISPOSABLE) ×2 IMPLANT
WATER STERILE IRR 1000ML POUR (IV SOLUTION) IMPLANT

## 2011-09-17 NOTE — Interval H&P Note (Signed)
History and Physical Interval Note:   09/17/2011   11:53 AM   Kendra Schultz  has presented today for surgery, with the diagnosis of right breast cancer   The various methods of treatment have been discussed with the patient and family. After consideration of risks, benefits and other options for treatment, the patient has consented to  Procedure(s): MASTECTOMY WITH SENTINEL LYMPH NODE BIOPSY as a surgical intervention .  The patients' history has been reviewed, patient examined, no change in status, stable for surgery.  I have reviewed the patients' chart and labs.  Questions were answered to the patient's satisfaction.     Ladana Chavero A.  MD

## 2011-09-17 NOTE — Preoperative (Signed)
Beta Blockers   Reason not to administer Beta Blockers:Not Applicable. No home beta blockers 

## 2011-09-17 NOTE — Anesthesia Preprocedure Evaluation (Addendum)
Anesthesia Evaluation  Patient identified by MRN, date of birth, ID band Patient awake    Reviewed: Allergy & Precautions, H&P , NPO status , Patient's Chart, lab work & pertinent test results, reviewed documented beta blocker date and time   History of Anesthesia Complications (+) AWARENESS UNDER ANESTHESIA  Airway Mallampati: II TM Distance: <3 FB Neck ROM: full    Dental No notable dental hx. (+) Missing and Poor Dentition   Pulmonary neg pulmonary ROS,          Cardiovascular hypertension, Pt. on medications regular Normal    Neuro/Psych Negative Neurological ROS     GI/Hepatic Neg liver ROS, GERD-  ,  Endo/Other    Renal/GU negative Renal ROS  Genitourinary negative   Musculoskeletal   Abdominal   Peds  Hematology   Anesthesia Other Findings   Reproductive/Obstetrics                        Anesthesia Physical Anesthesia Plan  ASA: II  Anesthesia Plan: General   Post-op Pain Management:    Induction: Intravenous  Airway Management Planned: Oral ETT  Additional Equipment:   Intra-op Plan:   Post-operative Plan: Extubation in OR  Informed Consent: I have reviewed the patients History and Physical, chart, labs and discussed the procedure including the risks, benefits and alternatives for the proposed anesthesia with the patient or authorized representative who has indicated his/her understanding and acceptance.     Plan Discussed with: CRNA, Anesthesiologist and Surgeon  Anesthesia Plan Comments:         Anesthesia Quick Evaluation

## 2011-09-17 NOTE — Progress Notes (Signed)
Rn called md about getting order for foot stick because patient had right simple mastectomy with syntinol node mapping and hx of left mastectomy. md said it was ok to stick right arm and use it for blood pressures.

## 2011-09-17 NOTE — Transfer of Care (Signed)
Immediate Anesthesia Transfer of Care Note  Patient: Kendra Schultz  Procedure(s) Performed:  MASTECTOMY WITH SENTINEL LYMPH NODE BIOPSY - nuclear medicine injection 30 minutes prior for surgery   Patient Location: PACU  Anesthesia Type: General  Level of Consciousness: awake and alert   Airway & Oxygen Therapy: Patient Spontanous Breathing and Patient connected to face mask oxygen  Post-op Assessment: Report given to PACU RN and Post -op Vital signs reviewed and stable  Post vital signs: Reviewed and stable  Complications: No apparent anesthesia complications

## 2011-09-17 NOTE — Anesthesia Procedure Notes (Addendum)
Procedure Name: Intubation Date/Time: 09/17/2011 12:30 PM Performed by: Charm Barges, Geet Hosking R Pre-anesthesia Checklist: Patient identified, Emergency Drugs available, Suction available, Patient being monitored and Timeout performed Patient Re-evaluated:Patient Re-evaluated prior to inductionOxygen Delivery Method: Circle System Utilized Preoxygenation: Pre-oxygenation with 100% oxygen Intubation Type: IV induction Ventilation: Mask ventilation without difficulty Laryngoscope Size: Mac and 3 Grade View: Grade II Tube type: Oral Tube size: 7.5 mm Number of attempts: 2 Airway Equipment and Method: stylet Placement Confirmation: ETT inserted through vocal cords under direct vision,  positive ETCO2 and breath sounds checked- equal and bilateral Secured at: 21 cm Tube secured with: Tape Dental Injury: Teeth and Oropharynx as per pre-operative assessment

## 2011-09-17 NOTE — Op Note (Signed)
Preop diagnosis: Stage II right breast cancer  Postop diagnosis: Same  Procedure: Right simple mastectomy with sentinel lymph node mapping  Surgeon: Harriette Bouillon M.D.  Anesthesia: Gen. endotracheal anesthesia  EBL: Less than 50 cc  IV fluids: Thousand cc crystalloid  Specimen right breast and right axillary sentinel lymph node negative by touch prep  Indications for procedure: The patient presents to to right breast cancer. She was seen in the multidisciplinary breast clinic. She had a history of previous right breast DC I S. treated by lumpectomy 20 years ago. She developed right breast pain and a right breast mass. 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 We discussed both breast conserving treatments and mastectomy. She had a previous left mastectomy due to breast cancer over 25 years ago. She wish to proceed with right simple mastectomy and sentinel lymph node mapping.The surgical and non surgical options have been discussed with the patient.  Risks of surgery include bleeding,  Infection,  Flap necrosis,  Tissue loss,  Chronic pain, death, Numbness,  And the need for additional procedures.  Reconstruction options also have been discussed with the patient as well.  The patient agrees to proceed.Sentinel lymph node mapping and dissection has been discussed with the patient.  Risk of bleeding,  Infection,  Seroma formation,  Additional procedures,,  Shoulder weakness ,  Shoulder stiffness,  Nerve and blood vessel injury and reaction to the mapping dyes have been discussed.  Alternatives to surgery have been discussed with the patient.  The patient agrees to proceed.  Description of procedure: The patient was seen in the holding area in the right breast was marked. Questions were answered. The right breast was injected by nuclear medicine. She was taken back to the operating room. She was placed supine. General anesthesia was initiated. After sterile prep  of the right nipple 4 cc of methylene blue dye mixed with saline was injected under the right nipple. This was massaged. The right chest was prepped and draped in a sterile fashion. Timeout was done. She received preop antibiotics 30 minutes prior to the start of the procedure. Curved linear incisions were made above and below the nipple. Superior and inferior skin flaps were raised. The probe was used to identify a right axillary sentinel node which was blue and hot. There are no other sentinel nodes identified. Touch prep showed this to be negative for metastatic disease. The breast was excised off the pectoralis major muscle to include the fascia. The wound was irrigated. He was found to be hemostatic. 219 round Blake drains were placed. The skin was closed with a deep layer of 3-0 Vicryl and 4-0 Monocryl. Dermabond was applied. Drains are secured to the skin with 2-0 nylon. All final counts the sponge, instruments and needles were correct. The patient was awoke taken to recovery in satisfactory condition.

## 2011-09-17 NOTE — Anesthesia Postprocedure Evaluation (Signed)
  Anesthesia Post-op Note  Patient: Kendra Schultz  Procedure(s) Performed:  MASTECTOMY WITH SENTINEL LYMPH NODE BIOPSY - nuclear medicine injection 30 minutes prior for surgery   Patient Location: PACU  Anesthesia Type: General  Level of Consciousness: awake, alert  and oriented  Airway and Oxygen Therapy: Patient Spontanous Breathing and Patient connected to nasal cannula oxygen  Post-op Pain: mild  Post-op Assessment: Post-op Vital signs reviewed, Patient's Cardiovascular Status Stable, Respiratory Function Stable, Patent Airway, No signs of Nausea or vomiting and Pain level controlled  Post-op Vital Signs: Reviewed and stable  Complications: No apparent anesthesia complications

## 2011-09-18 MED ORDER — OXYCODONE-ACETAMINOPHEN 5-325 MG PO TABS: 2.0000 | ORAL_TABLET | ORAL | Status: AC | PRN

## 2011-09-18 MED ORDER — LISINOPRIL 10 MG PO TABS: 10.0000 mg | ORAL_TABLET | Freq: Every day | ORAL | Status: DC

## 2011-09-18 NOTE — Progress Notes (Signed)
D/C instructions and prescriptions given and discussed with pt. Pt instructed about how to empty and charge drains and how to keep record of amount of drainage.  Pt had no questions. D/C pt to home.

## 2011-09-18 NOTE — Progress Notes (Signed)
1 Day Post-Op  Subjective: Doing well  Objective: Vital signs in last 24 hours: Temp:  [97 F (36.1 C)-98.6 F (37 C)] 98.5 F (36.9 C) (11/28 0515) Pulse Rate:  [48-64] 50  (11/28 0515) Resp:  [14-25] 18  (11/28 0515) BP: (102-158)/(48-82) 109/48 mmHg (11/28 0515) SpO2:  [91 %-97 %] 94 % (11/28 0515) Last BM Date: 09/17/11  Intake/Output from previous day: 11/27 0701 - 11/28 0700 In: 2440 [I.V.:1940; IV Piggyback:500] Out: 805 [Urine:400; Drains:405] Intake/Output this shift:    right mastectomy site clean dry intact.  no hematoma.  jp serosanguinous.  Lab Results:   [facility] 09/17/11 1738  WBC 11.9*  HGB 10.9*  HCT 33.0*  PLT 196   BMET  Basename 09/17/11 1738  NA --  K --  CL --  CO2 --  GLUCOSE --  BUN --  CREATININE 0.78  CALCIUM --   PT/INR No results found for this basename: LABPROT:2,INR:2 in the last 72 hours ABG No results found for this basename: PHART:2,PCO2:2,PO2:2,HCO3:2 in the last 72 hours  Studies/Results: No results found.  Anti-infectives: Anti-infectives     Start     Dose/Rate Route Frequency Ordered Stop   09/16/11 1515   ciprofloxacin (CIPRO) IVPB 400 mg        400 mg 200 mL/hr over 60 Minutes Intravenous 120 min pre-op 09/16/11 1504 09/17/11 1225          Assessment/Plan: s/p Procedure(s): MASTECTOMY WITH SENTINEL LYMPH NODE BIOPSY Discharge Follow up 1 week Drain care  LOS: 1 day    Crysta Gulick A. 09/18/2011

## 2011-09-18 NOTE — Discharge Summary (Signed)
Physician Discharge Summary  Patient ID: Kendra Schultz MRN: [id number] DOB/AGE: 1946/02/02 65 y.o.  Admit date: 09/17/2011 Discharge date: 09/18/2011  Admission Diagnoses: stage 2 breast cancer  Discharge Diagnoses: same Active Problems:  * No active hospital problems. *    Discharged Condition: good  Hospital Course: UNREMARKABLE  Consults: none  Significant Diagnostic Studies: none  Treatments: surgery: right mastectomy and SLN  Discharge Exam: Blood pressure 109/48, pulse 50, temperature 98.5 F (36.9 C), temperature source Oral, resp. rate 18, SpO2 94.00%. Incision/Wound: CLEAN DRY INTACT  Disposition: Final discharge disposition not confirmed  Discharge Orders    Future Orders Please Complete By Expires   Diet - low sodium heart healthy      Increase activity slowly      Driving Restrictions      Comments:   No driving until drains out   Lifting restrictions      Comments:   No lifting more than15 lbs   No dressing needed      Discharge instructions      Comments:   Empty drains and record output     Current Discharge Medication List    START taking these medications   Details  lisinopril (PRINIVIL,ZESTRIL) 10 MG tablet Take 1 tablet (10 mg total) by mouth daily. Qty: 30 tablet, Refills: 1    oxyCODONE-acetaminophen (PERCOCET) 5-325 MG per tablet Take 2 tablets by mouth every 4 (four) hours as needed for pain. Qty: 30 tablet, Refills: 0      CONTINUE these medications which have NOT CHANGED   Details  cholecalciferol (VITAMIN D) 1000 UNITS tablet Take 1,000 Units by mouth daily.      ibuprofen (ADVIL,MOTRIN) 200 MG tablet Take 400-600 mg by mouth every 6 (six) hours as needed. For pain     lisinopril-hydrochlorothiazide (PRINZIDE,ZESTORETIC) 10-12.5 MG per tablet Take 1 tablet by mouth daily.      OVER THE COUNTER MEDICATION Take 1 tablet by mouth daily as needed. Famotidine over the counter.  For acid reflux     loratadine (CLARITIN) 10 MG  tablet Take 10 mg by mouth daily as needed. For allergies     Multiple Vitamins-Minerals (MULTIVITAMINS THER. W/MINERALS) TABS Take 1 tablet by mouth daily.         Follow-up Information    Follow up with Trenda Corliss A., MD. Make an appointment in 1 week.   Contact information:   [employer] [facility] [address] [place] [address] [phone]          Signed: Dortha Schwalbe. 09/18/2011, 8:20 AM

## 2011-09-19 NOTE — Progress Notes (Signed)
Utilization review completed. Suits, Teri Diane11/29/2012  

## 2011-09-20 ENCOUNTER — Encounter (HOSPITAL_COMMUNITY): Payer: Self-pay | Admitting: Surgery

## 2011-09-23 ENCOUNTER — Ambulatory Visit (INDEPENDENT_AMBULATORY_CARE_PROVIDER_SITE_OTHER): Payer: Medicare Other | Admitting: Surgery

## 2011-09-23 ENCOUNTER — Encounter: Payer: Self-pay | Admitting: *Deleted

## 2011-09-23 ENCOUNTER — Telehealth: Payer: Self-pay | Admitting: Oncology

## 2011-09-23 ENCOUNTER — Encounter (INDEPENDENT_AMBULATORY_CARE_PROVIDER_SITE_OTHER): Payer: Self-pay | Admitting: Surgery

## 2011-09-23 VITALS — BP 144/84 | HR 68 | Temp 99.5°F | Resp 16 | Ht 61.0 in | Wt 155.2 lb

## 2011-09-23 DIAGNOSIS — Z9889 Other specified postprocedural states: Secondary | ICD-10-CM

## 2011-09-23 MED ORDER — DOXYCYCLINE HYCLATE 100 MG PO TABS: 100.0000 mg | ORAL_TABLET | Freq: Two times a day (BID) | ORAL | Status: AC

## 2011-09-23 NOTE — Patient Instructions (Signed)
Take antibiotics.  Miralax for constipation.  Take 1 cap full in the am and one in pm.OK to shower.  Return 1 week to remove last drain.

## 2011-09-23 NOTE — Telephone Encounter (Signed)
called pt and scheduled appt for 10/08/2011

## 2011-09-23 NOTE — Progress Notes (Signed)
Patient returns after right simple mastectomy with sentinel lymph node mapping. 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  Exam: Right mastectomy incision healing well. On the superior flap she has some very small abscess in the superior flap. There is no significant erythema. I removed her medial drain.  Impression: Status post right simple mastectomy with sentinel lymph node mapping for DCIS  Plan: Start doxycycline for the small carbuncles involving the superior flap. Return to clinic one week. Marland Kitchen

## 2011-09-24 ENCOUNTER — Telehealth (INDEPENDENT_AMBULATORY_CARE_PROVIDER_SITE_OTHER): Payer: Self-pay

## 2011-09-24 ENCOUNTER — Encounter: Payer: Self-pay | Admitting: *Deleted

## 2011-09-24 NOTE — Progress Notes (Signed)
Ordered Oncotype Dx w/ [facility].  Faxed request to Pathology. 

## 2011-09-24 NOTE — Telephone Encounter (Signed)
Patient called stating [facility] never received the Doxycycline prescription ordered on 09/23/11, I called an RX for Doxyclcyline 100mg  tablet, take 1 po, bid, #20, 0 refills, (Dr. Luisa Hart) [facility] [address] [place].  Patient is aware her prescription has been called in.

## 2011-10-02 ENCOUNTER — Encounter: Payer: Self-pay | Admitting: *Deleted

## 2011-10-02 ENCOUNTER — Ambulatory Visit (INDEPENDENT_AMBULATORY_CARE_PROVIDER_SITE_OTHER): Payer: Medicare Other | Admitting: Surgery

## 2011-10-02 ENCOUNTER — Encounter (INDEPENDENT_AMBULATORY_CARE_PROVIDER_SITE_OTHER): Payer: Self-pay | Admitting: Surgery

## 2011-10-02 VITALS — BP 148/92 | HR 64 | Temp 97.6°F | Resp 18 | Ht 61.5 in | Wt 154.2 lb

## 2011-10-02 DIAGNOSIS — Z9889 Other specified postprocedural states: Secondary | ICD-10-CM

## 2011-10-02 NOTE — Progress Notes (Signed)
Received Oncotype results of 10.  Gave copy to MD & sent a copy to [facility].

## 2011-10-02 NOTE — Patient Instructions (Signed)
Follow up 1 week.

## 2011-10-02 NOTE — Progress Notes (Signed)
Patient returns after right simple mastectomy with sentinel lymph node mapping. 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  Exam: Right mastectomy incision healing well. On the superior flap she has some very small abscess in the superior flap. There is no significant erythema. I removed her  Lateral drain. I aspirated 70 cc of fluid.  Impression: Status post right simple mastectomy with sentinel lymph node mapping for DCIS  Plan: Flaps look beter. Return to clinic one week. Marland Kitchen

## 2011-10-08 ENCOUNTER — Ambulatory Visit (HOSPITAL_BASED_OUTPATIENT_CLINIC_OR_DEPARTMENT_OTHER): Payer: Medicare Other | Admitting: Oncology

## 2011-10-08 ENCOUNTER — Telehealth: Payer: Self-pay | Admitting: Oncology

## 2011-10-08 VITALS — BP 164/83 | HR 57 | Temp 98.7°F | Ht 61.5 in | Wt 152.5 lb

## 2011-10-08 DIAGNOSIS — Z170 Estrogen receptor positive status [ER+]: Secondary | ICD-10-CM

## 2011-10-08 DIAGNOSIS — E559 Vitamin D deficiency, unspecified: Secondary | ICD-10-CM

## 2011-10-08 DIAGNOSIS — C50919 Malignant neoplasm of unspecified site of unspecified female breast: Secondary | ICD-10-CM

## 2011-10-08 DIAGNOSIS — Z9010 Acquired absence of unspecified breast and nipple: Secondary | ICD-10-CM

## 2011-10-08 DIAGNOSIS — D0590 Unspecified type of carcinoma in situ of unspecified breast: Secondary | ICD-10-CM

## 2011-10-08 MED ORDER — ANASTROZOLE 1 MG PO TABS: 1.0000 mg | ORAL_TABLET | Freq: Every day | ORAL | Status: AC

## 2011-10-08 NOTE — Telephone Encounter (Signed)
Gv pt appt for march2013 °

## 2011-10-10 ENCOUNTER — Other Ambulatory Visit: Payer: Self-pay | Admitting: *Deleted

## 2011-10-11 ENCOUNTER — Encounter (INDEPENDENT_AMBULATORY_CARE_PROVIDER_SITE_OTHER): Payer: Self-pay | Admitting: Surgery

## 2011-10-11 ENCOUNTER — Ambulatory Visit (INDEPENDENT_AMBULATORY_CARE_PROVIDER_SITE_OTHER): Payer: Medicare Other | Admitting: Surgery

## 2011-10-11 VITALS — BP 170/90 | HR 60 | Resp 12 | Ht 61.0 in | Wt 154.0 lb

## 2011-10-11 DIAGNOSIS — Z9889 Other specified postprocedural states: Secondary | ICD-10-CM

## 2011-10-11 NOTE — Progress Notes (Signed)
Patient returns after right simple mastectomy with sentinel lymph node mapping. 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  Exam: Right mastectomy incision healing well.. There is no significant erythema. . I aspirated 130 cc of fluid under sterile conditions.  Impression: Status post right simple mastectomy with sentinel lymph node mapping for DCIS  Plan: Flaps look beter. Return to clinic two weeks.

## 2011-10-11 NOTE — Patient Instructions (Signed)
Follow-up in 2 weeks

## 2011-10-13 NOTE — Progress Notes (Signed)
  Kendra Schultz is here for f/u. She underwent mastectomy with SN evaluation . I sn was negative. The mastectomy sample had residual dcis , but no invasive disease. As such, she will not require chemotherapy. The original sample had a 0.5 cm focus of invasive disease that was strongly er/pr+ with a low proliferative index.  I recommended that she begin AI therapy. I discussed s/e with her and recommended obtaining an up to date bone density test and vitamin d level. She expressed understanding and is anxious to proceed.  Pierce Crane md

## 2011-10-23 ENCOUNTER — Other Ambulatory Visit: Payer: Self-pay | Admitting: Oncology

## 2011-10-23 ENCOUNTER — Encounter: Payer: Self-pay | Admitting: Oncology

## 2011-10-23 ENCOUNTER — Ambulatory Visit
Admission: RE | Admit: 2011-10-23 | Discharge: 2011-10-23 | Disposition: A | Discharge status: Home / Self Care | Payer: Medicare Other | Source: Ambulatory Visit | Attending: Oncology | Admitting: Oncology

## 2011-10-23 DIAGNOSIS — E559 Vitamin D deficiency, unspecified: Secondary | ICD-10-CM

## 2011-10-23 DIAGNOSIS — C50919 Malignant neoplasm of unspecified site of unspecified female breast: Secondary | ICD-10-CM

## 2011-10-24 ENCOUNTER — Ambulatory Visit (INDEPENDENT_AMBULATORY_CARE_PROVIDER_SITE_OTHER): Payer: Medicare Other | Admitting: Surgery

## 2011-10-24 ENCOUNTER — Encounter (INDEPENDENT_AMBULATORY_CARE_PROVIDER_SITE_OTHER): Payer: Self-pay | Admitting: Surgery

## 2011-10-24 VITALS — BP 176/94 | HR 88 | Temp 99.0°F | Resp 14 | Ht 61.0 in | Wt 154.0 lb

## 2011-10-24 DIAGNOSIS — Z9889 Other specified postprocedural states: Secondary | ICD-10-CM

## 2011-10-24 NOTE — Progress Notes (Signed)
Patient returns after right simple mastectomy with sentinel lymph node mapping. 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  Exam: Right mastectomy incision healing well.. There is no significant erythema. .Seroma minimal.  Impression: Status post right simple mastectomy with sentinel lymph node mapping for DCIS  Plan:Return in 6 weeks

## 2011-10-24 NOTE — Patient Instructions (Signed)
Follow-up in 6 weeks

## 2011-10-28 ENCOUNTER — Encounter: Payer: Self-pay | Admitting: Oncology

## 2011-10-30 ENCOUNTER — Encounter: Payer: Self-pay | Admitting: Oncology

## 2011-12-12 ENCOUNTER — Ambulatory Visit (INDEPENDENT_AMBULATORY_CARE_PROVIDER_SITE_OTHER): Payer: Medicare Other | Admitting: Surgery

## 2011-12-12 ENCOUNTER — Encounter (INDEPENDENT_AMBULATORY_CARE_PROVIDER_SITE_OTHER): Payer: Self-pay | Admitting: Surgery

## 2011-12-12 VITALS — BP 152/106 | HR 76 | Temp 99.1°F | Resp 18 | Ht 61.0 in | Wt 151.2 lb

## 2011-12-12 DIAGNOSIS — Z9889 Other specified postprocedural states: Secondary | ICD-10-CM

## 2011-12-12 NOTE — Progress Notes (Signed)
Patient returns after right simple mastectomy with sentinel lymph node mapping. 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  Exam: Right mastectomy incision healing well.. There is no significant erythema. .Seroma minimal.  Impression: Status post right simple mastectomy with sentinel lymph node mapping for DCIS  Plan:Return in 6 months

## 2011-12-12 NOTE — Patient Instructions (Signed)
Return in 6 months

## 2012-01-07 ENCOUNTER — Telehealth: Payer: Self-pay | Admitting: Oncology

## 2012-01-07 ENCOUNTER — Ambulatory Visit (HOSPITAL_BASED_OUTPATIENT_CLINIC_OR_DEPARTMENT_OTHER): Payer: Medicare Other | Admitting: Oncology

## 2012-01-07 ENCOUNTER — Other Ambulatory Visit (HOSPITAL_BASED_OUTPATIENT_CLINIC_OR_DEPARTMENT_OTHER): Payer: Medicare Other | Admitting: Lab

## 2012-01-07 VITALS — BP 138/89 | HR 75 | Temp 98.1°F | Ht 61.0 in | Wt 151.6 lb

## 2012-01-07 DIAGNOSIS — E559 Vitamin D deficiency, unspecified: Secondary | ICD-10-CM

## 2012-01-07 DIAGNOSIS — C50919 Malignant neoplasm of unspecified site of unspecified female breast: Secondary | ICD-10-CM

## 2012-01-07 LAB — CBC WITH DIFFERENTIAL/PLATELET
BASO%: 0.3 % (ref 0.0–2.0)
Eosinophils Absolute: 0.1 10*3/uL (ref 0.0–0.5)
LYMPH%: 34.5 % (ref 14.0–49.7)
MCHC: 34.2 g/dL (ref 31.5–36.0)
MONO#: 0.3 10*3/uL (ref 0.1–0.9)
NEUT#: 3.9 10*3/uL (ref 1.5–6.5)
RBC: 4.59 10*6/uL (ref 3.70–5.45)
RDW: 13.5 % (ref 11.2–14.5)
WBC: 6.6 10*3/uL (ref 3.9–10.3)
lymph#: 2.3 10*3/uL (ref 0.9–3.3)

## 2012-01-07 MED ORDER — ALENDRONATE SODIUM 35 MG PO TABS: 35.0000 mg | ORAL_TABLET | ORAL | Status: DC

## 2012-01-07 NOTE — Telephone Encounter (Signed)
gve the pt her sept 2013 appt calendar °

## 2012-01-07 NOTE — Progress Notes (Signed)
Hematology and Oncology Follow Up Visit  Kendra Schultz [id number] December 02, 1945 66 y.o. 01/07/2012 2:08 PM PCP  Principle Diagnosis: 66 year old with history of right breast cancer ER/PR positive, November 2012, status post mastectomy History of previous breast cancer right breast status post lumpectomy 1995 History of previous breast cancer left breast status post mastectomy 1997  Interim History:  There have been no intercurrent illness, hospitalizations or medication changes. Kendra Schultz is on Arimidex. She feels fairly well. She tolerates this well. She had a baseline bone density test in January which suggested a T score of -3.8 in the femur -1.8 in the spine. We are checking this.  Medications: I have reviewed the patient's current medications.  Allergies:  Allergies  Allergen Reactions  . Keflex Nausea And Vomiting    Past Medical History, Surgical history, Social history, and Family History were reviewed and updated.  Review of Systems: Constitutional:  Negative for fever, chills, night sweats, anorexia, weight loss, pain. Cardiovascular: no chest pain or dyspnea on exertion Respiratory: negative Neurological: negative Dermatological: negative ENT: negative Skin Gastrointestinal: negative Genito-Urinary: negative Hematological and Lymphatic: negative Breast:neg Musculoskeletal: negative Remaining ROS negative.  Physical Exam: Blood pressure 138/89, pulse 75, temperature 98.1 F (36.7 C), height 5\' 1"  (1.549 m), weight 151 lb 9.6 oz (68.765 kg). ECOG: 0 General appearance: alert, cooperative and appears stated age Head: Normocephalic, without obvious abnormality, atraumatic Neck: no adenopathy, no carotid bruit, no JVD, supple, symmetrical, trachea midline and thyroid not enlarged, symmetric, no tenderness/mass/nodules Lymph nodes: Cervical, supraclavicular, and axillary nodes normal. Cardiac : regular rate and rhythm, no murmurs or gallops Pulmonary:clear to  auscultation bilaterally and normal percussion bilaterally Breasts: inspection negative, no nipple discharge or bleeding, no masses or nodularity palpable and Status post mastectomies without evidence of local recurrence Abdomen:soft, non-tender; bowel sounds normal; no masses,  no organomegaly Extremities negative Neuro: alert, oriented, normal speech, no focal findings or movement disorder noted  Lab Results: Lab Results  Component Value Date   WBC 6.6 01/07/2012   HGB 13.0 01/07/2012   HCT 38.0 01/07/2012   MCV 82.8 01/07/2012   PLT 237 01/07/2012     Chemistry      Component Value Date/Time   NA 143 09/06/2011 0910   K 3.9 09/06/2011 0910   CL 106 09/06/2011 0910   CO2 27 09/06/2011 0910   BUN 17 09/06/2011 0910   CREATININE 0.78 09/17/2011 1738      Component Value Date/Time   CALCIUM 9.7 09/06/2011 0910   ALKPHOS 87 09/06/2011 0910   AST 22 09/06/2011 0910   ALT 36* 09/06/2011 0910   BILITOT 0.4 09/06/2011 0910      .pathology. Radiological Studies: chest X-ray n/a Mammogram n/a Bone density reviewed  Impression and Plan: This is a 66 year old woman with a history of a now bilateral breast cancers with a further recurrence in 2012. I do not have results of her genetic screening we will need to look into that. She is tolerating the Arimidex well. I will see her in 6 months time for followup. In the interim I have started her on Fosamax 35 mg weekly and she'll continue taking her calcium an additional vitamin D.  More than 50% of the visit was spent in patient-related counselling   Pierce Crane, MD 3/19/20132:08 PM

## 2012-01-08 LAB — COMPREHENSIVE METABOLIC PANEL
ALT: 38 U/L — ABNORMAL HIGH (ref 0–35)
Albumin: 4.3 g/dL (ref 3.5–5.2)
CO2: 22 mEq/L (ref 19–32)
Potassium: 3.9 mEq/L (ref 3.5–5.3)
Sodium: 142 mEq/L (ref 135–145)
Total Bilirubin: 0.4 mg/dL (ref 0.3–1.2)
Total Protein: 6.7 g/dL (ref 6.0–8.3)

## 2012-01-08 LAB — VITAMIN D 25 HYDROXY (VIT D DEFICIENCY, FRACTURES): Vit D, 25-Hydroxy: 57 ng/mL (ref 30–89)

## 2012-07-14 ENCOUNTER — Other Ambulatory Visit (HOSPITAL_BASED_OUTPATIENT_CLINIC_OR_DEPARTMENT_OTHER): Payer: Medicare Other | Admitting: Lab

## 2012-07-14 ENCOUNTER — Telehealth: Payer: Self-pay | Admitting: Oncology

## 2012-07-14 ENCOUNTER — Ambulatory Visit (HOSPITAL_BASED_OUTPATIENT_CLINIC_OR_DEPARTMENT_OTHER): Payer: Medicare Other | Admitting: Oncology

## 2012-07-14 VITALS — BP 151/82 | HR 58 | Temp 98.8°F | Resp 20 | Ht 61.0 in | Wt 161.6 lb

## 2012-07-14 DIAGNOSIS — E559 Vitamin D deficiency, unspecified: Secondary | ICD-10-CM

## 2012-07-14 DIAGNOSIS — C50919 Malignant neoplasm of unspecified site of unspecified female breast: Secondary | ICD-10-CM

## 2012-07-14 LAB — CBC WITH DIFFERENTIAL/PLATELET
BASO%: 0.6 % (ref 0.0–2.0)
EOS%: 1.6 % (ref 0.0–7.0)
HCT: 39.4 % (ref 34.8–46.6)
LYMPH%: 50.4 % — ABNORMAL HIGH (ref 14.0–49.7)
MCH: 28.1 pg (ref 25.1–34.0)
MCHC: 32.8 g/dL (ref 31.5–36.0)
NEUT%: 38.5 % (ref 38.4–76.8)
Platelets: 294 10*3/uL (ref 145–400)
RBC: 4.59 10*6/uL (ref 3.70–5.45)
lymph#: 5.6 10*3/uL — ABNORMAL HIGH (ref 0.9–3.3)

## 2012-07-14 LAB — COMPREHENSIVE METABOLIC PANEL (CC13)
AST: 16 U/L (ref 5–34)
Alkaline Phosphatase: 53 U/L (ref 40–150)
BUN: 29 mg/dL — ABNORMAL HIGH (ref 7.0–26.0)
Creatinine: 0.9 mg/dL (ref 0.6–1.1)

## 2012-07-14 MED ORDER — ANASTROZOLE 1 MG PO TABS: 1.0000 mg | ORAL_TABLET | Freq: Every day | ORAL | Status: DC

## 2012-07-14 NOTE — Progress Notes (Signed)
Hematology and Oncology Follow Up Visit  Kendra Schultz [id number] [birth year] 66 y.o. 07/14/2012 11:41 AM PCP  Principle Diagnosis: 66 year old with history of right breast cancer ER/PR positive, November 2012, status post mastectomy History of previous breast cancer right breast status post lumpectomy 1995 History of previous breast cancer left breast status post mastectomy 1997  Interim History:  There have been no intercurrent illness, hospitalizations or medication changes. Kendra Schultz is on Arimidex. She feels fairly well. She tolerates this well. She had a baseline bone density test in January which suggested a T score of -3.8 in the femur -1.8 in the spine. We are checking this.  Medications: I have reviewed the patient's current medications.  Allergies:  Allergies  Allergen Reactions  . Cephalexin Nausea And Vomiting    Past Medical History, Surgical history, Social history, and Family History were reviewed and updated.  Review of Systems: Constitutional:  Negative for fever, chills, night sweats, anorexia, weight loss, pain. Cardiovascular: no chest pain or dyspnea on exertion Respiratory: negative Neurological: negative Dermatological: negative ENT: negative Skin Gastrointestinal: negative Genito-Urinary: negative Hematological and Lymphatic: negative Breast:neg Musculoskeletal: negative Remaining ROS negative.  Physical Exam: Blood pressure 151/82, pulse 58, temperature 98.8 F (37.1 C), temperature source Oral, resp. rate 20, height 5\' 1"  (1.549 m), weight 161 lb 9.6 oz (73.301 kg). ECOG: 0 General appearance: alert, cooperative and appears stated age Head: Normocephalic, without obvious abnormality, atraumatic Neck: no adenopathy, no carotid bruit, no JVD, supple, symmetrical, trachea midline and thyroid not enlarged, symmetric, no tenderness/mass/nodules Lymph nodes: Cervical, supraclavicular, and axillary nodes normal. Cardiac : regular rate and rhythm, no  murmurs or gallops Pulmonary:clear to auscultation bilaterally and normal percussion bilaterally Breasts: inspection negative, no nipple discharge or bleeding, no masses or nodularity palpable and Status post mastectomies without evidence of local recurrence Abdomen:soft, non-tender; bowel sounds normal; no masses,  no organomegaly Extremities negative Neuro: alert, oriented, normal speech, no focal findings or movement disorder noted  Lab Results: Lab Results  Component Value Date   WBC 11.0* 07/14/2012   HGB 12.9 07/14/2012   HCT 39.4 07/14/2012   MCV 85.7 07/14/2012   PLT 294 07/14/2012     Chemistry      Component Value Date/Time   NA 140 07/14/2012 1024   NA 142 01/07/2012 1256   K 3.9 07/14/2012 1024   K 3.9 01/07/2012 1256   CL 105 07/14/2012 1024   CL 106 01/07/2012 1256   CO2 24 07/14/2012 1024   CO2 22 01/07/2012 1256   BUN 29.0* 07/14/2012 1024   BUN 19 01/07/2012 1256   CREATININE 0.9 07/14/2012 1024   CREATININE 0.81 01/07/2012 1256      Component Value Date/Time   CALCIUM 10.1 07/14/2012 1024   CALCIUM 9.5 01/07/2012 1256   ALKPHOS 53 07/14/2012 1024   ALKPHOS 70 01/07/2012 1256   AST 16 07/14/2012 1024   AST 21 01/07/2012 1256   ALT 38 07/14/2012 1024   ALT 38* 01/07/2012 1256   BILITOT 0.30 07/14/2012 1024   BILITOT 0.4 01/07/2012 1256      .pathology. Radiological Studies: chest X-ray n/a Mammogram n/a Bone density reviewed  Impression and Plan: This is a 66 year old woman with a history of a now bilateral breast cancers with a further recurrence in 2012. I do not have results of her genetic screening we will need to look into that. She is tolerating the Arimidex well. I will see her in 6 months time for followup. In the interim I have started  her on Fosamax 35 mg weekly and she'll continue taking her calcium an additional vitamin D.  More than 50% of the visit was spent in patient-related counselling   Pierce Crane, MD 9/24/201311:41 AM

## 2012-07-14 NOTE — Telephone Encounter (Signed)
gve the pt her march 2014 appt calendar °

## 2012-07-15 ENCOUNTER — Encounter (INDEPENDENT_AMBULATORY_CARE_PROVIDER_SITE_OTHER): Payer: Self-pay | Admitting: Surgery

## 2012-08-14 ENCOUNTER — Ambulatory Visit (INDEPENDENT_AMBULATORY_CARE_PROVIDER_SITE_OTHER): Payer: Medicare Other | Admitting: Surgery

## 2012-08-14 ENCOUNTER — Encounter (INDEPENDENT_AMBULATORY_CARE_PROVIDER_SITE_OTHER): Payer: Self-pay | Admitting: Surgery

## 2012-08-14 VITALS — BP 156/96 | HR 92 | Temp 97.9°F | Resp 16 | Ht 61.0 in | Wt 159.8 lb

## 2012-08-14 DIAGNOSIS — Z853 Personal history of malignant neoplasm of breast: Secondary | ICD-10-CM

## 2012-08-14 NOTE — Patient Instructions (Signed)
RETURN 1 YEAR 

## 2012-08-14 NOTE — Progress Notes (Signed)
NAME: Kendra Schultz       DOB: [birth year]           DATE: 08/14/2012       MRN: [id number]   Kendra Schultz is a 66 y.o.Marland Kitchenfemale  with history of right breast cancer ER/PR positive, November 2012, status post mastectomy  History of previous breast cancer right breast status post lumpectomy 1995  History of previous breast cancer left breast status post mastectomy 1997   PFSH: She has had no significant changes since the last visit here.  ROS: There have been no significant changes since the last visit here  EXAM:  VS: BP 156/96  Pulse 92  Temp 97.9 F (36.6 C)  Resp 16  Ht 5\' 1"  (1.549 m)  Wt 159 lb 12.8 oz (72.485 kg)  BMI 30.19 kg/m2  General: The patient is alert, oriented, generally healthy appearing, NAD. Mood and affect are normal. Breasts:  absent.  No masses noted scars well healed  Lymphatics: She has no axillary or supraclavicular adenopathy on either side.  Extremities: Full ROM of the surgical side with no lymphedema noted.    Impression: Doing well, with no evidence of recurrent cancer or new cancer  Plan: Will continue to follow up on an annual basis here.

## 2012-11-27 ENCOUNTER — Telehealth: Payer: Self-pay | Admitting: *Deleted

## 2012-11-27 NOTE — Telephone Encounter (Signed)
Confirmed appt with Larina Bras, NP on 12/22/12 at 10:00.

## 2012-11-28 ENCOUNTER — Encounter: Payer: Self-pay | Admitting: Oncology

## 2012-12-22 ENCOUNTER — Ambulatory Visit: Payer: Medicare Other | Admitting: Family

## 2013-01-12 ENCOUNTER — Other Ambulatory Visit: Payer: Medicare Other | Admitting: Lab

## 2013-01-12 ENCOUNTER — Ambulatory Visit: Payer: Medicare Other | Admitting: Oncology

## 2013-01-28 ENCOUNTER — Other Ambulatory Visit: Payer: Self-pay | Admitting: Oncology

## 2013-01-28 DIAGNOSIS — C50919 Malignant neoplasm of unspecified site of unspecified female breast: Secondary | ICD-10-CM

## 2013-02-23 ENCOUNTER — Other Ambulatory Visit: Payer: Self-pay | Admitting: *Deleted

## 2013-02-23 DIAGNOSIS — C50919 Malignant neoplasm of unspecified site of unspecified female breast: Secondary | ICD-10-CM

## 2013-02-23 MED ORDER — ALENDRONATE SODIUM 35 MG PO TABS: ORAL_TABLET | ORAL | Status: DC

## 2013-02-25 ENCOUNTER — Telehealth: Payer: Self-pay | Admitting: *Deleted

## 2013-02-25 NOTE — Telephone Encounter (Signed)
Left message for a return phone call to reschedule her appt.  Awaiting a patient response.

## 2013-02-25 NOTE — Telephone Encounter (Signed)
Received call back from patient to reschedule her appt. Confirmed appt. For 03/18/13 at 10am with Dr. Welton Flakes.

## 2013-03-18 ENCOUNTER — Telehealth: Payer: Self-pay | Admitting: Oncology

## 2013-03-18 ENCOUNTER — Ambulatory Visit (HOSPITAL_BASED_OUTPATIENT_CLINIC_OR_DEPARTMENT_OTHER): Payer: Medicare Other | Admitting: Oncology

## 2013-03-18 ENCOUNTER — Encounter: Payer: Self-pay | Admitting: Oncology

## 2013-03-18 VITALS — BP 143/95 | HR 72 | Temp 98.4°F | Resp 20 | Ht 61.0 in | Wt 166.1 lb

## 2013-03-18 DIAGNOSIS — C50919 Malignant neoplasm of unspecified site of unspecified female breast: Secondary | ICD-10-CM

## 2013-03-18 DIAGNOSIS — E559 Vitamin D deficiency, unspecified: Secondary | ICD-10-CM

## 2013-03-18 MED ORDER — ALENDRONATE SODIUM 35 MG PO TABS: ORAL_TABLET | ORAL | Status: DC

## 2013-03-18 MED ORDER — ANASTROZOLE 1 MG PO TABS: 1.0000 mg | ORAL_TABLET | Freq: Every day | ORAL | Status: DC

## 2013-03-18 NOTE — Patient Instructions (Addendum)
Continue arimidex 1 mg daily  Continue fosamax once a week  We discussed exercise and healthy diet  I will see you back in 6 months

## 2013-04-11 NOTE — Progress Notes (Signed)
OFFICE PROGRESS NOTE  CC Dr. Harriette Bouillon  DIAGNOSIS: 67 year old female with history of bilateral breast cancers  PRIOR THERAPY:  #1patient originally underwent a screening mammogram in October 2012 that showed scattered calcifications. 08/23/2011 patient had a diagnostic mammogram that showed a 4 mm cluster of calcifications. 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 The invasive component was ER +89% PR +81% with Ki-67 9%. It was HER-2/neu negative.  #2patient went on to have a mastectomy with sentinel lymph node biopsy performed in November 2012. The final pathology revealed only DCIS without any evidence of invasive cancer sentinel nodes were negative.  #3 1995 patient had a mastectomy on the left side that showed a DCIS she was not given adjuvant hormonal therapy. This was then off load [place]. In 1997 patient had a lumpectomy of the right side but she did not receive any adjuvant radiation or chemotherapy.  #4 after recent mastectomy of the right breast patient proceeded with antiestrogen therapy consisting of Arimidex 1 mg daily she is currently on this.she is also on Fosamax once a week.  CURRENT THERAPY:Arimidex 1 mg daily  INTERVAL HISTORY: Lonnie Rosado 67 y.o. female returns for followup visit. She was last seen by Dr. Donnie Coffin. Clinically patient seems to be doing well she is also continuing on Arimidex vitamin D and Fosamax. She is tolerating all of these drugs very nicely. She denies any fevers chills night sweats headaches no shortness of breath no chest pains palpitations she does have some hot flashes. She denies any peripheral paresthesias no aches or pains. Remainder of the 10 point review of systems is negative.  MEDICAL HISTORY: Past Medical History  Diagnosis Date  . GERD (gastroesophageal reflux disease)   . Arthritis   . Anemia   . Hypertension   . Cancer 1995, 2012     breast bilateral     ALLERGIES:  is allergic to cephalexin.  MEDICATIONS:  Current Outpatient Prescriptions  Medication Sig Dispense Refill  . albuterol (PROVENTIL HFA;VENTOLIN HFA) 108 (90 BASE) MCG/ACT inhaler Inhale 2 puffs into the lungs every 6 (six) hours as needed for wheezing.      Marland Kitchen alendronate (FOSAMAX) 35 MG tablet TAKE ONE TABLET BY MOUTH ONCE A WEEK-TAKE WITH A FULL GLASS OF WATER ON AN EMPTY STOMACH  4 tablet  8  . anastrozole (ARIMIDEX) 1 MG tablet Take 1 tablet (1 mg total) by mouth daily.  30 tablet  11  . beclomethasone (QVAR) 40 MCG/ACT inhaler Inhale 2 puffs into the lungs as needed.      . benazepril-hydrochlorthiazide (LOTENSIN HCT) 20-25 MG per tablet Take 1 tablet by mouth daily.      Marland Kitchen CALCIUM PO Take 1,200 mg by mouth daily.      . Cholecalciferol (VITAMIN D PO) Take 2,000 Units by mouth daily.       Marland Kitchen Fexofenadine HCl (ALLEGRA PO) Take by mouth.      . metaxalone (SKELAXIN) 800 MG tablet Take 800 mg by mouth as needed for pain.      . famotidine (PEPCID) 10 MG tablet Take 10 mg by mouth 2 (two) times daily as needed.       Marland Kitchen ibuprofen (ADVIL,MOTRIN) 200 MG tablet Take 400-600 mg by mouth every 6 (six) hours as needed. For pain        No current facility-administered medications for this visit.    SURGICAL HISTORY:  Past Surgical History  Procedure Laterality  Date  . Mastectomy  1995    left  . Breast lumpectomy  1997    right  . Appendectomy    . Ovarian cyst removal    . Tonsillectomy    . Mastectomy w/ sentinel node biopsy  09/17/2011    Procedure: MASTECTOMY WITH SENTINEL LYMPH NODE BIOPSY;  Surgeon: Clovis Pu. Cornett, MD;  Location: [facility] OR;  Service: General;  Laterality: Right;  nuclear medicine injection 30 minutes prior for surgery     REVIEW OF SYSTEMS:  Pertinent items are noted in HPI.   HEALTH MAINTENANCE:  PHYSICAL EXAMINATION: Blood pressure 143/95, pulse 72, temperature 98.4 F (36.9 C), temperature source Oral, resp. rate 20, height  5\' 1"  (1.549 m), weight 166 lb 2 oz (75.354 kg). Body mass index is 31.41 kg/(m^2). ECOG PERFORMANCE STATUS: 0 - Asymptomatic   General appearance: alert, cooperative and appears stated age Resp: clear to auscultation bilaterally Cardio: regular rate and rhythm GI: soft, non-tender; bowel sounds normal; no masses,  no organomegaly Extremities: extremities normal, atraumatic, no cyanosis or edema Neurologic: Grossly normal   LABORATORY DATA: Lab Results  Component Value Date   WBC 11.0* 07/14/2012   HGB 12.9 07/14/2012   HCT 39.4 07/14/2012   MCV 85.7 07/14/2012   PLT 294 07/14/2012      Chemistry      Component Value Date/Time   NA 140 07/14/2012 1024   NA 142 01/07/2012 1256   K 3.9 07/14/2012 1024   K 3.9 01/07/2012 1256   CL 105 07/14/2012 1024   CL 106 01/07/2012 1256   CO2 24 07/14/2012 1024   CO2 22 01/07/2012 1256   BUN 29.0* 07/14/2012 1024   BUN 19 01/07/2012 1256   CREATININE 0.9 07/14/2012 1024   CREATININE 0.81 01/07/2012 1256      Component Value Date/Time   CALCIUM 10.1 07/14/2012 1024   CALCIUM 9.5 01/07/2012 1256   ALKPHOS 53 07/14/2012 1024   ALKPHOS 70 01/07/2012 1256   AST 16 07/14/2012 1024   AST 21 01/07/2012 1256   ALT 38 07/14/2012 1024   ALT 38* 01/07/2012 1256   BILITOT 0.30 07/14/2012 1024   BILITOT 0.4 01/07/2012 1256     Diagnosis 1. Lymph node, sentinel, biopsy, Right #1 - ONE LYMPH NODE, NEGATIVE FOR TUMOR (0/1). 2. Breast, simple mastectomy, Right - DUCTAL CARCINOMA IN SITU, SEE COMMENT. - IN SITU CARCINOMA IS 5 CM FROM NEAREST MARGIN (DEEP). - PREVIOUS SURGICAL SITE CHANGES. - SEE TUMOR SYNOPTIC TEMPLATE BELOW. Microscopic Comment 2. BREAST, INVASIVE TUMOR, WITH LYMPH NODE SAMPLING Specimen, including laterality: Right breast Procedure: Simple Mastectomy Grade: 2 (grade is performed on previous biopsy [id number]) Tubule formation: 2 Nuclear pleomorphism: 2 Mitotic: 1 Tumor size (glass slide measurement): 0.6 cm (largest  focus) Margins: Invasive, distance to closest margin: N/A In-situ, distance to closest margin: 5 cm If margin positive, focally or broadly: N/A Lymphovascular invasion: Absent Ductal carcinoma in situ: Grade: 2/3 Extensive intraductal component: Present Lobular neoplasia: Absent Tumor focality: Unifocal Treatment effect: N/A If present, treatment effect in breast tissue, lymph nodes or both: N/A Extent of tumor: Skin: Grossly negative for tumor Nipple: Grossly negative for tumor 1 of 3 FINAL for CERRA, EISENHOWER ([id number]) Microscopic Comment(continued) Skeletal muscle: N/A Lymph nodes: # examined: 1 Lymph nodes with metastasis: 0 Breast prognostic profile: Estrogen receptor: Not repeated. Previous study demonstrated 89% positivity ([id number]) Progesterone receptor: Not repeated. Previous study demonstrated 81% positivity ([id number]) Her 2 neu: Not repeated. Previous study demonstrated no amplification (1.73) ([id number]) Ki-67:  Not repeated. Previous study demonstrated 9% proliferation rate ([id number]) Non-neoplastic breast: Benign fibrocystic change, previous biopsy site/lumpectomy site change, and microcalcifications in benign ducts and lobules TNM: (TNM established on invasive tumor from [id number]). pT1b, pN0, pMX Comments: The entire previous biopsy site was submitted for review. On review, there is no invasive tumor present. There are multiple foci of in situ carcinoma spanning the 2.0 cm hemorrhage biopsy cavity. Additionally, there is an incidental focus of DCIS present in a representing sampling of the upper lateral quadrant. (CR:mw 09-20-11)  RADIOGRAPHIC STUDIES:  No results found.  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 No evidence of recurrent  disease  #2 Patient was then started on arimidex 1 mg daily. A total of 5 years therapy is planned.  #3 osteopenia/osteoporosis  PLAN:  1. Continue arimidex 1 mg daily  2. Continue fosamax 70 mg q week  3. We will see her back in 6 months   All questions were answered. The patient knows to call the clinic with any problems, questions or concerns. We can certainly see the patient much sooner if necessary.  I spent 25 minutes counseling the patient face to face. The total time spent in the appointment was 30 minutes.    Drue Second, MD Medical/Oncology [facility] [phone] (beeper) [phone] (Office)

## 2013-07-23 ENCOUNTER — Ambulatory Visit (INDEPENDENT_AMBULATORY_CARE_PROVIDER_SITE_OTHER): Payer: Medicare Other | Admitting: Surgery

## 2013-07-23 ENCOUNTER — Encounter (INDEPENDENT_AMBULATORY_CARE_PROVIDER_SITE_OTHER): Payer: Self-pay | Admitting: Surgery

## 2013-07-23 VITALS — BP 130/90 | HR 96 | Temp 98.9°F | Resp 15 | Ht 61.0 in | Wt 161.8 lb

## 2013-07-23 DIAGNOSIS — Z853 Personal history of malignant neoplasm of breast: Secondary | ICD-10-CM

## 2013-07-23 NOTE — Patient Instructions (Signed)
Return next year.  

## 2013-07-23 NOTE — Progress Notes (Signed)
NAME: Kendra Schultz       DOB: [birth year]           DATE: 07/23/2013       MRN: [id number]   Kendra Schultz is a 67 y.o.Marland Kitchenfemale  with history of right breast cancer ER/PR positive, November 2012, status post mastectomy  History of previous breast cancer right breast status post lumpectomy 1995  History of previous breast cancer left breast status post mastectomy 1997   PFSH: She has had no significant changes since the last visit here.  ROS: There have been no significant changes since the last visit here  EXAM:  VS: BP 130/90  Pulse 96  Temp(Src) 98.9 F (37.2 C) (Temporal)  Resp 15  Ht 5\' 1"  (1.549 m)  Wt 161 lb 12.8 oz (73.392 kg)  BMI 30.59 kg/m2  General: The patient is alert, oriented, generally healthy appearing, NAD. Mood and affect are normal. Breasts:  absent.  No masses noted scars well healed  Lymphatics: She has no axillary or supraclavicular adenopathy on either side.  Extremities: Full ROM of the surgical side with no lymphedema noted.    Impression: Doing well, with no evidence of recurrent cancer or new cancer  Plan: Will continue to follow up on an annual basis here.

## 2013-09-06 ENCOUNTER — Ambulatory Visit (HOSPITAL_BASED_OUTPATIENT_CLINIC_OR_DEPARTMENT_OTHER): Payer: Medicare Other | Admitting: Oncology

## 2013-09-06 ENCOUNTER — Encounter: Payer: Self-pay | Admitting: Oncology

## 2013-09-06 ENCOUNTER — Other Ambulatory Visit (HOSPITAL_BASED_OUTPATIENT_CLINIC_OR_DEPARTMENT_OTHER): Payer: Medicare Other | Admitting: Lab

## 2013-09-06 ENCOUNTER — Telehealth: Payer: Self-pay | Admitting: *Deleted

## 2013-09-06 VITALS — BP 144/90 | HR 74 | Temp 99.7°F | Resp 18 | Ht 61.0 in | Wt 162.4 lb

## 2013-09-06 DIAGNOSIS — C50919 Malignant neoplasm of unspecified site of unspecified female breast: Secondary | ICD-10-CM

## 2013-09-06 DIAGNOSIS — C50119 Malignant neoplasm of central portion of unspecified female breast: Secondary | ICD-10-CM

## 2013-09-06 DIAGNOSIS — M810 Age-related osteoporosis without current pathological fracture: Secondary | ICD-10-CM

## 2013-09-06 DIAGNOSIS — C50911 Malignant neoplasm of unspecified site of right female breast: Secondary | ICD-10-CM | POA: Insufficient documentation

## 2013-09-06 DIAGNOSIS — Z170 Estrogen receptor positive status [ER+]: Secondary | ICD-10-CM

## 2013-09-06 LAB — CBC WITH DIFFERENTIAL/PLATELET
BASO%: 0.9 % (ref 0.0–2.0)
Basophils Absolute: 0.1 10*3/uL (ref 0.0–0.1)
EOS%: 1.7 % (ref 0.0–7.0)
HGB: 13.4 g/dL (ref 11.6–15.9)
MCH: 28.7 pg (ref 25.1–34.0)
MCHC: 33.3 g/dL (ref 31.5–36.0)
MCV: 86.3 fL (ref 79.5–101.0)
MONO%: 7.5 % (ref 0.0–14.0)
RBC: 4.66 10*6/uL (ref 3.70–5.45)
RDW: 14.5 % (ref 11.2–14.5)
lymph#: 2 10*3/uL (ref 0.9–3.3)

## 2013-09-06 LAB — COMPREHENSIVE METABOLIC PANEL (CC13)
ALT: 65 U/L — ABNORMAL HIGH (ref 0–55)
AST: 42 U/L — ABNORMAL HIGH (ref 5–34)
Albumin: 4 g/dL (ref 3.5–5.0)
Alkaline Phosphatase: 58 U/L (ref 40–150)
BUN: 16.5 mg/dL (ref 7.0–26.0)
Potassium: 3.6 mEq/L (ref 3.5–5.1)

## 2013-09-06 NOTE — Telephone Encounter (Signed)
appts made and printed...td 

## 2013-09-06 NOTE — Patient Instructions (Signed)
Discontinue the fosamax due to neuropathy  We will see you back in 6 months

## 2013-09-06 NOTE — Progress Notes (Signed)
OFFICE PROGRESS NOTE  CC Dr. Harriette Bouillon  DIAGNOSIS: 67 year old female with history of bilateral breast cancers  PRIOR THERAPY:  #1patient originally underwent a screening mammogram in October 2012 that showed scattered calcifications. 08/23/2011 patient had a diagnostic mammogram that showed a 4 mm cluster of calcifications. 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 The invasive component was ER +89% PR +81% with Ki-67 9%. It was HER-2/neu negative.  #2patient went on to have a mastectomy with sentinel lymph node biopsy performed in November 2012. The final pathology revealed only DCIS without any evidence of invasive cancer sentinel nodes were negative.  #3 1995 patient had a mastectomy on the left side that showed a DCIS she was not given adjuvant hormonal therapy. This was then off load [place]. In 1997 patient had a lumpectomy of the right side but she did not receive any adjuvant radiation or chemotherapy.  #4 after recent mastectomy of the right breast patient proceeded with antiestrogen therapy consisting of Arimidex 1 mg daily she is currently on this.she is also on Fosamax once a week.  CURRENT THERAPY:Arimidex 1 mg daily  INTERVAL HISTORY: Chamia Schmutz 67 y.o. female returns for followup visit.  Clinically patient seems to be doing well she is also continuing on Arimidex vitamin D and Fosamax. She is tolerating all of these drugs very nicely. She denies any fevers chills night sweats headaches no shortness of breath no chest pains palpitations she does have some hot flashes. She denies any peripheral paresthesias no aches or pains. Remainder of the 10 point review of systems is negative.  MEDICAL HISTORY: Past Medical History  Diagnosis Date  . GERD (gastroesophageal reflux disease)   . Arthritis   . Anemia   . Hypertension   . Cancer 1995, 2012    breast bilateral   . Breast  cancer 09/06/2013    ALLERGIES:  is allergic to cephalexin.  MEDICATIONS:  Current Outpatient Prescriptions  Medication Sig Dispense Refill  . albuterol (PROVENTIL HFA;VENTOLIN HFA) 108 (90 BASE) MCG/ACT inhaler Inhale 2 puffs into the lungs every 6 (six) hours as needed for wheezing.      Marland Kitchen alendronate (FOSAMAX) 35 MG tablet TAKE ONE TABLET BY MOUTH ONCE A WEEK-TAKE WITH A FULL GLASS OF WATER ON AN EMPTY STOMACH  4 tablet  8  . anastrozole (ARIMIDEX) 1 MG tablet Take 1 tablet (1 mg total) by mouth daily.  30 tablet  11  . beclomethasone (QVAR) 40 MCG/ACT inhaler Inhale 2 puffs into the lungs as needed.      . benazepril-hydrochlorthiazide (LOTENSIN HCT) 20-25 MG per tablet Take 1 tablet by mouth daily.      Marland Kitchen CALCIUM PO Take 1,200 mg by mouth daily.      . Cholecalciferol (VITAMIN D PO) Take 2,000 Units by mouth daily.       . famotidine (PEPCID) 10 MG tablet Take 10 mg by mouth 2 (two) times daily as needed.       Marland Kitchen Fexofenadine HCl (ALLEGRA PO) Take by mouth.      Marland Kitchen ibuprofen (ADVIL,MOTRIN) 200 MG tablet Take 400-600 mg by mouth every 6 (six) hours as needed. For pain       . metaxalone (SKELAXIN) 800 MG tablet Take 800 mg by mouth as needed for pain.       No current facility-administered medications for this visit.    SURGICAL HISTORY:  Past Surgical History  Procedure Laterality Date  .  Mastectomy  1995    left  . Breast lumpectomy  1997    right  . Appendectomy    . Ovarian cyst removal    . Tonsillectomy    . Mastectomy w/ sentinel node biopsy  09/17/2011    Procedure: MASTECTOMY WITH SENTINEL LYMPH NODE BIOPSY;  Surgeon: Clovis Pu. Cornett, MD;  Location: [facility] OR;  Service: General;  Laterality: Right;  nuclear medicine injection 30 minutes prior for surgery     REVIEW OF SYSTEMS:  Pertinent items are noted in HPI.   HEALTH MAINTENANCE:  PHYSICAL EXAMINATION: Blood pressure 144/90, pulse 74, temperature 99.7 F (37.6 C), temperature source Oral, resp. rate 18, height  5\' 1"  (1.549 m), weight 162 lb 6.4 oz (73.664 kg), SpO2 96.00%. Body mass index is 30.7 kg/(m^2). ECOG PERFORMANCE STATUS: 0 - Asymptomatic   General appearance: alert, cooperative and appears stated age Resp: clear to auscultation bilaterally Cardio: regular rate and rhythm GI: soft, non-tender; bowel sounds normal; no masses,  no organomegaly Extremities: extremities normal, atraumatic, no cyanosis or edema Neurologic: Grossly normal   LABORATORY DATA: Lab Results  Component Value Date   WBC 7.7 09/06/2013   HGB 13.4 09/06/2013   HCT 40.2 09/06/2013   MCV 86.3 09/06/2013   PLT 275 09/06/2013      Chemistry      Component Value Date/Time   NA 140 07/14/2012 1024   NA 142 01/07/2012 1256   K 3.9 07/14/2012 1024   K 3.9 01/07/2012 1256   CL 105 07/14/2012 1024   CL 106 01/07/2012 1256   CO2 24 07/14/2012 1024   CO2 22 01/07/2012 1256   BUN 29.0* 07/14/2012 1024   BUN 19 01/07/2012 1256   CREATININE 0.9 07/14/2012 1024   CREATININE 0.81 01/07/2012 1256      Component Value Date/Time   CALCIUM 10.1 07/14/2012 1024   CALCIUM 9.5 01/07/2012 1256   ALKPHOS 53 07/14/2012 1024   ALKPHOS 70 01/07/2012 1256   AST 16 07/14/2012 1024   AST 21 01/07/2012 1256   ALT 38 07/14/2012 1024   ALT 38* 01/07/2012 1256   BILITOT 0.30 07/14/2012 1024   BILITOT 0.4 01/07/2012 1256     Diagnosis 1. Lymph node, sentinel, biopsy, Right #1 - ONE LYMPH NODE, NEGATIVE FOR TUMOR (0/1). 2. Breast, simple mastectomy, Right - DUCTAL CARCINOMA IN SITU, SEE COMMENT. - IN SITU CARCINOMA IS 5 CM FROM NEAREST MARGIN (DEEP). - PREVIOUS SURGICAL SITE CHANGES. - SEE TUMOR SYNOPTIC TEMPLATE BELOW. Microscopic Comment 2. BREAST, INVASIVE TUMOR, WITH LYMPH NODE SAMPLING Specimen, including laterality: Right breast Procedure: Simple Mastectomy Grade: 2 (grade is performed on previous biopsy [id number]) Tubule formation: 2 Nuclear pleomorphism: 2 Mitotic: 1 Tumor size (glass slide measurement): 0.6 cm (largest  focus) Margins: Invasive, distance to closest margin: N/A In-situ, distance to closest margin: 5 cm If margin positive, focally or broadly: N/A Lymphovascular invasion: Absent Ductal carcinoma in situ: Grade: 2/3 Extensive intraductal component: Present Lobular neoplasia: Absent Tumor focality: Unifocal Treatment effect: N/A If present, treatment effect in breast tissue, lymph nodes or both: N/A Extent of tumor: Skin: Grossly negative for tumor Nipple: Grossly negative for tumor 1 of 3 FINAL for BLYTHE, VEACH ([id number]) Microscopic Comment(continued) Skeletal muscle: N/A Lymph nodes: # examined: 1 Lymph nodes with metastasis: 0 Breast prognostic profile: Estrogen receptor: Not repeated. Previous study demonstrated 89% positivity ([id number]) Progesterone receptor: Not repeated. Previous study demonstrated 81% positivity ([id number]) Her 2 neu: Not repeated. Previous study demonstrated no amplification (1.73) ([id number]) Ki-67: Not  repeated. Previous study demonstrated 9% proliferation rate ([id number]) Non-neoplastic breast: Benign fibrocystic change, previous biopsy site/lumpectomy site change, and microcalcifications in benign ducts and lobules TNM: (TNM established on invasive tumor from [id number]). pT1b, pN0, pMX Comments: The entire previous biopsy site was submitted for review. On review, there is no invasive tumor present. There are multiple foci of in situ carcinoma spanning the 2.0 cm hemorrhage biopsy cavity. Additionally, there is an incidental focus of DCIS present in a representing sampling of the upper lateral quadrant. (CR:mw 09-20-11)  RADIOGRAPHIC STUDIES:  No results found.  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 No evidence of recurrent  disease  #2 Patient was then started on arimidex 1 mg daily. A total of 5 years therapy is planned.  #3 osteopenia/osteoporosis  PLAN:  1. Continue arimidex 1 mg daily  2. Stop fosamax for the next few months due to neuropathy  3. We will see her back in 6 months   All questions were answered. The patient knows to call the clinic with any problems, questions or concerns. We can certainly see the patient much sooner if necessary.  I spent 15 minutes counseling the patient face to face. The total time spent in the appointment was 30 minutes.    Drue Second, MD Medical/Oncology [facility] [phone] (beeper) [phone] (Office)

## 2014-03-09 ENCOUNTER — Other Ambulatory Visit (HOSPITAL_BASED_OUTPATIENT_CLINIC_OR_DEPARTMENT_OTHER): Payer: Medicare Other

## 2014-03-09 ENCOUNTER — Ambulatory Visit (HOSPITAL_BASED_OUTPATIENT_CLINIC_OR_DEPARTMENT_OTHER): Payer: Medicare Other | Admitting: Adult Health

## 2014-03-09 ENCOUNTER — Ambulatory Visit: Payer: Medicare Other | Admitting: Oncology

## 2014-03-09 ENCOUNTER — Encounter: Payer: Self-pay | Admitting: Adult Health

## 2014-03-09 ENCOUNTER — Ambulatory Visit (HOSPITAL_BASED_OUTPATIENT_CLINIC_OR_DEPARTMENT_OTHER): Payer: Medicare Other

## 2014-03-09 ENCOUNTER — Telehealth: Payer: Self-pay | Admitting: Adult Health

## 2014-03-09 VITALS — BP 127/67 | HR 61 | Temp 99.0°F | Resp 18 | Ht 61.0 in | Wt 164.6 lb

## 2014-03-09 DIAGNOSIS — Z170 Estrogen receptor positive status [ER+]: Secondary | ICD-10-CM

## 2014-03-09 DIAGNOSIS — M810 Age-related osteoporosis without current pathological fracture: Secondary | ICD-10-CM

## 2014-03-09 DIAGNOSIS — C50919 Malignant neoplasm of unspecified site of unspecified female breast: Secondary | ICD-10-CM

## 2014-03-09 DIAGNOSIS — M8580 Other specified disorders of bone density and structure, unspecified site: Secondary | ICD-10-CM | POA: Insufficient documentation

## 2014-03-09 DIAGNOSIS — E559 Vitamin D deficiency, unspecified: Secondary | ICD-10-CM

## 2014-03-09 DIAGNOSIS — C50119 Malignant neoplasm of central portion of unspecified female breast: Secondary | ICD-10-CM

## 2014-03-09 DIAGNOSIS — Z853 Personal history of malignant neoplasm of breast: Secondary | ICD-10-CM

## 2014-03-09 DIAGNOSIS — E2839 Other primary ovarian failure: Secondary | ICD-10-CM

## 2014-03-09 LAB — CBC WITH DIFFERENTIAL/PLATELET
BASO%: 0.9 % (ref 0.0–2.0)
Basophils Absolute: 0.1 10*3/uL (ref 0.0–0.1)
EOS ABS: 0.3 10*3/uL (ref 0.0–0.5)
EOS%: 4.4 % (ref 0.0–7.0)
HEMATOCRIT: 40 % (ref 34.8–46.6)
HGB: 13.2 g/dL (ref 11.6–15.9)
LYMPH#: 1.9 10*3/uL (ref 0.9–3.3)
LYMPH%: 29.3 % (ref 14.0–49.7)
MCH: 28.2 pg (ref 25.1–34.0)
MCHC: 33 g/dL (ref 31.5–36.0)
MCV: 85.4 fL (ref 79.5–101.0)
MONO#: 0.6 10*3/uL (ref 0.1–0.9)
MONO%: 8.8 % (ref 0.0–14.0)
NEUT%: 56.6 % (ref 38.4–76.8)
NEUTROS ABS: 3.7 10*3/uL (ref 1.5–6.5)
PLATELETS: 271 10*3/uL (ref 145–400)
RBC: 4.68 10*6/uL (ref 3.70–5.45)
RDW: 14 % (ref 11.2–14.5)
WBC: 6.6 10*3/uL (ref 3.9–10.3)

## 2014-03-09 LAB — COMPREHENSIVE METABOLIC PANEL (CC13)
ALBUMIN: 4 g/dL (ref 3.5–5.0)
ALT: 69 U/L — AB (ref 0–55)
ANION GAP: 11 meq/L (ref 3–11)
AST: 35 U/L — ABNORMAL HIGH (ref 5–34)
Alkaline Phosphatase: 75 U/L (ref 40–150)
BILIRUBIN TOTAL: 0.31 mg/dL (ref 0.20–1.20)
BUN: 18.1 mg/dL (ref 7.0–26.0)
CALCIUM: 10 mg/dL (ref 8.4–10.4)
CHLORIDE: 108 meq/L (ref 98–109)
CO2: 24 meq/L (ref 22–29)
Creatinine: 0.9 mg/dL (ref 0.6–1.1)
GLUCOSE: 75 mg/dL (ref 70–140)
Potassium: 4 mEq/L (ref 3.5–5.1)
SODIUM: 143 meq/L (ref 136–145)
Total Protein: 7.3 g/dL (ref 6.4–8.3)

## 2014-03-09 MED ORDER — DENOSUMAB 60 MG/ML ~~LOC~~ SOLN
60.0000 mg | Freq: Once | SUBCUTANEOUS | Status: AC
  Administered 2014-03-09: 60 mg via SUBCUTANEOUS
  Filled 2014-03-09: qty 1

## 2014-03-09 MED ORDER — ANASTROZOLE 1 MG PO TABS: 1.0000 mg | ORAL_TABLET | Freq: Every day | ORAL | Status: DC

## 2014-03-09 NOTE — Patient Instructions (Signed)
Denosumab injection  What is this medicine?  DENOSUMAB (den oh sue mab) slows bone breakdown. Prolia is used to treat osteoporosis in women after menopause and in men. Xgeva is used to prevent bone fractures and other bone problems caused by cancer bone metastases. Xgeva is also used to treat giant cell tumor of the bone.  This medicine may be used for other purposes; ask your health care provider or pharmacist if you have questions.  COMMON BRAND NAME(S): Prolia, XGEVA  What should I tell my health care provider before I take this medicine?  They need to know if you have any of these conditions:  -dental disease  -eczema  -infection or history of infections  -kidney disease or on dialysis  -low blood calcium or vitamin D  -malabsorption syndrome  -scheduled to have surgery or tooth extraction  -taking medicine that contains denosumab  -thyroid or parathyroid disease  -an unusual reaction to denosumab, other medicines, foods, dyes, or preservatives  -pregnant or trying to get pregnant  -breast-feeding  How should I use this medicine?  This medicine is for injection under the skin. It is given by a health care professional in a hospital or clinic setting.  If you are getting Prolia, a special MedGuide will be given to you by the pharmacist with each prescription and refill. Be sure to read this information carefully each time.  For Prolia, talk to your pediatrician regarding the use of this medicine in children. Special care may be needed. For Xgeva, talk to your pediatrician regarding the use of this medicine in children. While this drug may be prescribed for children as young as 13 years for selected conditions, precautions do apply.  Overdosage: If you think you've taken too much of this medicine contact a poison control center or emergency room at once.  Overdosage: If you think you have taken too much of this medicine contact a poison control center or emergency room at once.  NOTE: This medicine is only for  you. Do not share this medicine with others.  What if I miss a dose?  It is important not to miss your dose. Call your doctor or health care professional if you are unable to keep an appointment.  What may interact with this medicine?  Do not take this medicine with any of the following medications:  -other medicines containing denosumab  This medicine may also interact with the following medications:  -medicines that suppress the immune system  -medicines that treat cancer  -steroid medicines like prednisone or cortisone  This list may not describe all possible interactions. Give your health care provider a list of all the medicines, herbs, non-prescription drugs, or dietary supplements you use. Also tell them if you smoke, drink alcohol, or use illegal drugs. Some items may interact with your medicine.  What should I watch for while using this medicine?  Visit your doctor or health care professional for regular checks on your progress. Your doctor or health care professional may order blood tests and other tests to see how you are doing.  Call your doctor or health care professional if you get a cold or other infection while receiving this medicine. Do not treat yourself. This medicine may decrease your body's ability to fight infection.  You should make sure you get enough calcium and vitamin D while you are taking this medicine, unless your doctor tells you not to. Discuss the foods you eat and the vitamins you take with your health care professional.    See your dentist regularly. Brush and floss your teeth as directed. Before you have any dental work done, tell your dentist you are receiving this medicine.  Do not become pregnant while taking this medicine or for 5 months after stopping it. Women should inform their doctor if they wish to become pregnant or think they might be pregnant. There is a potential for serious side effects to an unborn child. Talk to your health care professional or pharmacist for more  information.  What side effects may I notice from receiving this medicine?  Side effects that you should report to your doctor or health care professional as soon as possible:  -allergic reactions like skin rash, itching or hives, swelling of the face, lips, or tongue  -breathing problems  -chest pain  -fast, irregular heartbeat  -feeling faint or lightheaded, falls  -fever, chills, or any other sign of infection  -muscle spasms, tightening, or twitches  -numbness or tingling  -skin blisters or bumps, or is dry, peels, or red  -slow healing or unexplained pain in the mouth or jaw  -unusual bleeding or bruising  Side effects that usually do not require medical attention (Report these to your doctor or health care professional if they continue or are bothersome.):  -muscle pain  -stomach upset, gas  This list may not describe all possible side effects. Call your doctor for medical advice about side effects. You may report side effects to FDA at 1-800-FDA-1088.  Where should I keep my medicine?  This medicine is only given in a clinic, doctor's office, or other health care setting and will not be stored at home.  NOTE: This sheet is a summary. It may not cover all possible information. If you have questions about this medicine, talk to your doctor, pharmacist, or health care provider.  © 2014, [employer]. (2012-04-06 12:37:47)

## 2014-03-09 NOTE — Patient Instructions (Addendum)
You are doing well.  You have no sign of recurrence.  Continue taking Arimidex daily.  I recommend a healthy diet, exercise, and chest wall exams.  We will see you back in 6 months.  Please call us if you have any questions or concerns.     Denosumab injection What is this medicine? DENOSUMAB (den oh sue mab) slows bone breakdown. Prolia is used to treat osteoporosis in women after menopause and in men. Delton See is used to prevent bone fractures and other bone problems caused by cancer bone metastases. Delton See is also used to treat giant cell tumor of the bone. This medicine may be used for other purposes; ask your health care provider or pharmacist if you have questions. COMMON BRAND NAME(S): Prolia, XGEVA What should I tell my health care provider before I take this medicine? They need to know if you have any of these conditions: -dental disease -eczema -infection or history of infections -kidney disease or on dialysis -low blood calcium or vitamin D -malabsorption syndrome -scheduled to have surgery or tooth extraction -taking medicine that contains denosumab -thyroid or parathyroid disease -an unusual reaction to denosumab, other medicines, foods, dyes, or preservatives -pregnant or trying to get pregnant -breast-feeding How should I use this medicine? This medicine is for injection under the skin. It is given by a health care professional in a hospital or clinic setting. If you are getting Prolia, a special MedGuide will be given to you by the pharmacist with each prescription and refill. Be sure to read this information carefully each time. For Prolia, talk to your pediatrician regarding the use of this medicine in children. Special care may be needed. For Delton See, talk to your pediatrician regarding the use of this medicine in children. While this drug may be prescribed for children as young as 13 years for selected conditions, precautions do apply. Overdosage: If you think you've taken too  much of this medicine contact a poison control center or emergency room at once. Overdosage: If you think you have taken too much of this medicine contact a poison control center or emergency room at once. NOTE: This medicine is only for you. Do not share this medicine with others. What if I miss a dose? It is important not to miss your dose. Call your doctor or health care professional if you are unable to keep an appointment. What may interact with this medicine? Do not take this medicine with any of the following medications: -other medicines containing denosumab This medicine may also interact with the following medications: -medicines that suppress the immune system -medicines that treat cancer -steroid medicines like prednisone or cortisone This list may not describe all possible interactions. Give your health care provider a list of all the medicines, herbs, non-prescription drugs, or dietary supplements you use. Also tell them if you smoke, drink alcohol, or use illegal drugs. Some items may interact with your medicine. What should I watch for while using this medicine? Visit your doctor or health care professional for regular checks on your progress. Your doctor or health care professional may order blood tests and other tests to see how you are doing. Call your doctor or health care professional if you get a cold or other infection while receiving this medicine. Do not treat yourself. This medicine may decrease your body's ability to fight infection. You should make sure you get enough calcium and vitamin D while you are taking this medicine, unless your doctor tells you not to. Discuss the foods you  eat and the vitamins you take with your health care professional. See your dentist regularly. Brush and floss your teeth as directed. Before you have any dental work done, tell your dentist you are receiving this medicine. Do not become pregnant while taking this medicine or for 5 months  after stopping it. Women should inform their doctor if they wish to become pregnant or think they might be pregnant. There is a potential for serious side effects to an unborn child. Talk to your health care professional or pharmacist for more information. What side effects may I notice from receiving this medicine? Side effects that you should report to your doctor or health care professional as soon as possible: -allergic reactions like skin rash, itching or hives, swelling of the face, lips, or tongue -breathing problems -chest pain -fast, irregular heartbeat -feeling faint or lightheaded, falls -fever, chills, or any other sign of infection -muscle spasms, tightening, or twitches -numbness or tingling -skin blisters or bumps, or is dry, peels, or red -slow healing or unexplained pain in the mouth or jaw -unusual bleeding or bruising Side effects that usually do not require medical attention (Report these to your doctor or health care professional if they continue or are bothersome.): -muscle pain -stomach upset, gas This list may not describe all possible side effects. Call your doctor for medical advice about side effects. You may report side effects to FDA at 1-800-FDA-1088. Where should I keep my medicine? This medicine is only given in a clinic, doctor's office, or other health care setting and will not be stored at home. NOTE: This sheet is a summary. It may not cover all possible information. If you have questions about this medicine, talk to your doctor, pharmacist, or health care provider.  2014, [employer] Standard. (2012-04-06 12:37:47)

## 2014-03-09 NOTE — Progress Notes (Signed)
ID: [name] OB: January 21, 1946  MR#: [id number]  CSN#:[id number]  PCP: Imelda Pillow, NP GYN:   SU: Dr. Brantley Stage OTHER MD:  CHIEF COMPLAINT: Patient with h/o right sided invasive ductal carcinoma here for follow up.    BREAST CANCER HISTORY:  Mrs. Busser originally underwent a screening mammogram in October 2012 that showed scattered calcifications. 08/23/2011 patient had a diagnostic mammogram that showed a right sided 4 mm cluster of calcifications. She had biopsy performed on 08/27/2011 which revealed both invasive ductal carcinoma as well as DCIS with lymphovascular invasion. The invasive component was ER +89% PR +81% with Ki-67 9%. It was HER-2/neu negative.   CURRENT THERAPY:  Arimidex daily  INTERVAL HISTORY: Patient is here today for evaluation of her h/o right breast cancer.  She is taking Arimidex daily.  She does experience joint aches with this.  She denies hot flashes, dry eyes, vaginal dryness, or any further concerns.  She tells me that she was taken off of Fosamax by Dr. Humphrey Rolls, however she was not put on any other medication for her osteoporosis.  She wants to know what else she can take.  Otherwise, she is doing well and denies fevers, chills, pain, night sweats, nausea, vomiting, constipation, diarrhea, or any further concerns.   REVIEW OF SYSTEMS:A 10 point review of systems was conducted and is otherwise negative except for what is noted above.     PAST MEDICAL HISTORY: Past Medical History  Diagnosis Date  . GERD (gastroesophageal reflux disease)   . Arthritis   . Anemia   . Hypertension   . Cancer 1995, 2012    breast bilateral   . Breast cancer 09/06/2013    PAST SURGICAL HISTORY: Past Surgical History  Procedure Laterality Date  . Mastectomy  1995    left  . Breast lumpectomy  1997    right  . Appendectomy    . Ovarian cyst removal    . Tonsillectomy    . Mastectomy w/ sentinel node biopsy  09/17/2011    Procedure: MASTECTOMY WITH SENTINEL  LYMPH NODE BIOPSY;  Surgeon: Joyice Faster. Cornett, MD;  Location: [facility] OR;  Service: General;  Laterality: Right;  nuclear medicine injection 30 minutes prior for surgery     FAMILY HISTORY Family History  Problem Relation Age of Onset  . Heart disease Father   . Cancer Father     lymphoma  . Heart disease Brother     GYNECOLOGIC HISTORY: menarche 62, G2 P2, was on birth control previously, without any problems, no h/o sexually transmitted infections  SOCIAL HISTORY: Lives with mother, sister, and 2 sons ages 55 and 49.  Patient is retired, she enjoys puzzles and shopping with her free time.     ADVANCED DIRECTIVES: not in place   HEALTH MAINTENANCE: History  Substance Use Topics  . Smoking status: Former Smoker -- 1.00 packs/day    Types: Cigarettes    Quit date: 11/22/2011  . Smokeless tobacco: Not on file  . Alcohol Use: No      Mammogram: 08/2011, s/p bilateral mastectomy Colonoscopy: no, refuses to have one Bone Density Scan: 10/23/2011 Pap Smear: last in about 2009 Eye Exam: 03/10/14 Vitamin D Level:  06/2012 Lipid Panel: appointment this week.   Allergies  Allergen Reactions  . Cephalexin Nausea And Vomiting    Current Outpatient Prescriptions  Medication Sig Dispense Refill  . albuterol (PROVENTIL HFA;VENTOLIN HFA) 108 (90 BASE) MCG/ACT inhaler Inhale 2 puffs into the lungs every 6 (six) hours as needed for  wheezing.      Marland Kitchen anastrozole (ARIMIDEX) 1 MG tablet Take 1 tablet (1 mg total) by mouth daily.  30 tablet  11  . beclomethasone (QVAR) 40 MCG/ACT inhaler Inhale 2 puffs into the lungs as needed.      . benazepril-hydrochlorthiazide (LOTENSIN HCT) 20-25 MG per tablet Take 1 tablet by mouth daily.      Marland Kitchen CALCIUM PO Take 1,200 mg by mouth daily.      . Cholecalciferol (VITAMIN D PO) Take 2,000 Units by mouth daily.       . famotidine (PEPCID) 10 MG tablet Take 10 mg by mouth 2 (two) times daily as needed.       Marland Kitchen Fexofenadine HCl (ALLEGRA PO) Take by mouth.       Marland Kitchen ibuprofen (ADVIL,MOTRIN) 200 MG tablet Take 400-600 mg by mouth every 6 (six) hours as needed. For pain       . metaxalone (SKELAXIN) 800 MG tablet Take 800 mg by mouth as needed for pain.       No current facility-administered medications for this visit.    OBJECTIVE: Filed Vitals:   03/09/14 1318  BP: 127/67  Pulse: 61  Temp: 99 F (37.2 C)  Resp: 18     Body mass index is 31.12 kg/(m^2).     GENERAL: Patient is a well appearing female in no acute distress HEENT:  Sclerae anicteric.  Oropharynx clear and moist. No ulcerations or evidence of oropharyngeal candidiasis. Neck is supple.  NODES:  No cervical, supraclavicular, or axillary lymphadenopathy palpated.  BREAST EXAM: s/p bilateral mastectomies, no nodularity, no skin change, no sign of recurrence.  Benign bilateral breast exam. LUNGS:  Clear to auscultation bilaterally.  No wheezes or rhonchi. HEART:  Regular rate and rhythm. No murmur appreciated. ABDOMEN:  Soft, nontender.  Positive, normoactive bowel sounds. No organomegaly palpated. MSK:  No focal spinal tenderness to palpation. Full range of motion bilaterally in the upper extremities. EXTREMITIES:  No peripheral edema.   SKIN:  Clear with no obvious rashes or skin changes. No nail dyscrasia. NEURO:  Nonfocal. Well oriented.  Appropriate affect. ECOG FS:1 - Symptomatic but completely ambulatory  LAB RESULTS:  CMP     Component Value Date/Time   NA 143 03/09/2014 1230   NA 142 01/07/2012 1256   K 4.0 03/09/2014 1230   K 3.9 01/07/2012 1256   CL 105 07/14/2012 1024   CL 106 01/07/2012 1256   CO2 24 03/09/2014 1230   CO2 22 01/07/2012 1256   GLUCOSE 75 03/09/2014 1230   GLUCOSE 80 07/14/2012 1024   GLUCOSE 149* 01/07/2012 1256   BUN 18.1 03/09/2014 1230   BUN 19 01/07/2012 1256   CREATININE 0.9 03/09/2014 1230   CREATININE 0.81 01/07/2012 1256   CALCIUM 10.0 03/09/2014 1230   CALCIUM 9.5 01/07/2012 1256   PROT 7.3 03/09/2014 1230   PROT 6.7 01/07/2012 1256   ALBUMIN 4.0  03/09/2014 1230   ALBUMIN 4.3 01/07/2012 1256   AST 35* 03/09/2014 1230   AST 21 01/07/2012 1256   ALT 69* 03/09/2014 1230   ALT 38* 01/07/2012 1256   ALKPHOS 75 03/09/2014 1230   ALKPHOS 70 01/07/2012 1256   BILITOT 0.31 03/09/2014 1230   BILITOT 0.4 01/07/2012 1256   GFRNONAA 86* 09/17/2011 1738   GFRAA >90 09/17/2011 1738    I No results found for this basename: SPEP, UPEP,  kappa and lambda light chains    Lab Results  Component Value Date   WBC 6.6 03/09/2014  NEUTROABS 3.7 03/09/2014   HGB 13.2 03/09/2014   HCT 40.0 03/09/2014   MCV 85.4 03/09/2014   PLT 271 03/09/2014      Chemistry      Component Value Date/Time   NA 143 03/09/2014 1230   NA 142 01/07/2012 1256   K 4.0 03/09/2014 1230   K 3.9 01/07/2012 1256   CL 105 07/14/2012 1024   CL 106 01/07/2012 1256   CO2 24 03/09/2014 1230   CO2 22 01/07/2012 1256   BUN 18.1 03/09/2014 1230   BUN 19 01/07/2012 1256   CREATININE 0.9 03/09/2014 1230   CREATININE 0.81 01/07/2012 1256      Component Value Date/Time   CALCIUM 10.0 03/09/2014 1230   CALCIUM 9.5 01/07/2012 1256   ALKPHOS 75 03/09/2014 1230   ALKPHOS 70 01/07/2012 1256   AST 35* 03/09/2014 1230   AST 21 01/07/2012 1256   ALT 69* 03/09/2014 1230   ALT 38* 01/07/2012 1256   BILITOT 0.31 03/09/2014 1230   BILITOT 0.4 01/07/2012 1256       Lab Results  Component Value Date   LABCA2 21 09/04/2011    No components found with this basename: LABCA125    No results found for this basename: INR,  in the last 168 hours  Urinalysis No results found for this basename: colorurine, appearanceur, labspec, phurine, glucoseu, hgbur, bilirubinur, ketonesur, proteinur, urobilinogen, nitrite, leukocytesur    STUDIES: No results found.  ASSESSMENT: 68 y.o. 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  1.1995 patient had a mastectomy on the  left side that showed a DCIS she was not given adjuvant hormonal therapy. This was then off load [place]. In 1997 patient had a lumpectomy of the right side but she did not receive any adjuvant radiation or chemotherapy.   2. Patient went on to have a right mastectomy mastectomy with sentinel lymph node biopsy performed on September 17, 2011. The final pathology revealed only DCIS without any evidence of invasive cancer sentinel nodes were negative.    3.  After mastectomy of the right breast  On 09/17/2011 the patient proceeded with antiestrogen therapy consisting of Arimidex 1 mg daily.  4. Osteoporosis: Patient was previously on Fosamax but had difficulty tolerating it.  She will start Prolia given every 6 months on 03/09/14.    PLAN:   Mrs. Parkerson is doing very well today.  She has no sign of recurrence.  She is tolerating her Arimidex very well.  She will continue taking this.  Her labs are stable, I reviewed them with her in detail.    We discussed her osteoporosis, and she was taken off of Fosamax by Dr. Humphrey Rolls.  We will start Prolia today for her osteoporosis.  I reviewed this with the patient in detail and gave her detailed information in her AVS regarding this medication.    I discussed survivorship with the patient in detail.  Her health maintenance was reviewed above along with her gynecologic and social history.  I recommended a healthy diet, exercise, and self breast exams.    The patient will return in 6 months for labs, evaluation, and an injection appointment.   She knows to call us in the interim for any questions or concerns.  We can certainly see her sooner if needed.  I spent 40 minutes counseling the patient  face to face.  The total time spent in the appointment was 50 minutes.   Minette Headland, [place] [phone]    03/09/2014 1:29 PM

## 2014-03-09 NOTE — Telephone Encounter (Signed)
, °

## 2014-03-28 ENCOUNTER — Ambulatory Visit
Admission: RE | Admit: 2014-03-28 | Discharge: 2014-03-28 | Disposition: A | Discharge status: Home / Self Care | Payer: Medicare Other | Source: Ambulatory Visit | Attending: Adult Health | Admitting: Adult Health

## 2014-03-28 ENCOUNTER — Telehealth: Payer: Self-pay | Admitting: *Deleted

## 2014-03-28 DIAGNOSIS — E2839 Other primary ovarian failure: Secondary | ICD-10-CM

## 2014-03-28 NOTE — Telephone Encounter (Signed)
Called to inform pt of bone density results. Communicated with pt that test showed her osteoporosis has improved and she will continue to receive Prolia. Pt verbalized understanding. Message to be forwarded to Charlestine Massed, NP.

## 2014-04-06 ENCOUNTER — Telehealth: Payer: Self-pay

## 2014-04-06 NOTE — Telephone Encounter (Signed)
Received bby mail bone scan dtd 03/28/14 from [facility].  Original to scan, copy to [facility].

## 2014-08-15 ENCOUNTER — Other Ambulatory Visit (INDEPENDENT_AMBULATORY_CARE_PROVIDER_SITE_OTHER): Payer: Self-pay | Admitting: Surgery

## 2014-08-15 DIAGNOSIS — R188 Other ascites: Secondary | ICD-10-CM

## 2014-08-15 DIAGNOSIS — R140 Abdominal distension (gaseous): Secondary | ICD-10-CM

## 2014-08-18 ENCOUNTER — Ambulatory Visit
Admission: RE | Admit: 2014-08-18 | Discharge: 2014-08-18 | Disposition: A | Discharge status: Home / Self Care | Payer: Medicare Other | Source: Ambulatory Visit | Attending: Surgery | Admitting: Surgery

## 2014-08-18 DIAGNOSIS — R188 Other ascites: Secondary | ICD-10-CM

## 2014-08-18 MED ORDER — IOHEXOL 300 MG/ML  SOLN
100.0000 mL | Freq: Once | INTRAMUSCULAR | Status: AC | PRN
  Administered 2014-08-18: 100 mL via INTRAVENOUS

## 2014-09-06 ENCOUNTER — Other Ambulatory Visit: Payer: Self-pay | Admitting: *Deleted

## 2014-09-06 DIAGNOSIS — M810 Age-related osteoporosis without current pathological fracture: Secondary | ICD-10-CM

## 2014-09-06 DIAGNOSIS — C50919 Malignant neoplasm of unspecified site of unspecified female breast: Secondary | ICD-10-CM

## 2014-09-07 ENCOUNTER — Ambulatory Visit (HOSPITAL_BASED_OUTPATIENT_CLINIC_OR_DEPARTMENT_OTHER): Payer: Medicare Other | Admitting: Adult Health

## 2014-09-07 ENCOUNTER — Other Ambulatory Visit (HOSPITAL_BASED_OUTPATIENT_CLINIC_OR_DEPARTMENT_OTHER): Payer: Medicare Other

## 2014-09-07 ENCOUNTER — Telehealth: Payer: Self-pay | Admitting: Adult Health

## 2014-09-07 ENCOUNTER — Encounter: Payer: Self-pay | Admitting: Adult Health

## 2014-09-07 ENCOUNTER — Ambulatory Visit (HOSPITAL_BASED_OUTPATIENT_CLINIC_OR_DEPARTMENT_OTHER): Payer: Medicare Other

## 2014-09-07 VITALS — BP 135/60 | HR 62 | Temp 97.9°F | Resp 18 | Ht 61.0 in | Wt 165.4 lb

## 2014-09-07 DIAGNOSIS — D0511 Intraductal carcinoma in situ of right breast: Secondary | ICD-10-CM

## 2014-09-07 DIAGNOSIS — C50919 Malignant neoplasm of unspecified site of unspecified female breast: Secondary | ICD-10-CM

## 2014-09-07 DIAGNOSIS — Z853 Personal history of malignant neoplasm of breast: Secondary | ICD-10-CM

## 2014-09-07 DIAGNOSIS — M810 Age-related osteoporosis without current pathological fracture: Secondary | ICD-10-CM

## 2014-09-07 DIAGNOSIS — E559 Vitamin D deficiency, unspecified: Secondary | ICD-10-CM

## 2014-09-07 DIAGNOSIS — C50911 Malignant neoplasm of unspecified site of right female breast: Secondary | ICD-10-CM

## 2014-09-07 LAB — COMPREHENSIVE METABOLIC PANEL (CC13)
ALBUMIN: 3.9 g/dL (ref 3.5–5.0)
ALT: 97 U/L — ABNORMAL HIGH (ref 0–55)
AST: 55 U/L — ABNORMAL HIGH (ref 5–34)
Alkaline Phosphatase: 65 U/L (ref 40–150)
Anion Gap: 10 mEq/L (ref 3–11)
BILIRUBIN TOTAL: 0.29 mg/dL (ref 0.20–1.20)
BUN: 15.4 mg/dL (ref 7.0–26.0)
CO2: 26 mEq/L (ref 22–29)
Calcium: 10.2 mg/dL (ref 8.4–10.4)
Chloride: 108 mEq/L (ref 98–109)
Creatinine: 1 mg/dL (ref 0.6–1.1)
GLUCOSE: 93 mg/dL (ref 70–140)
POTASSIUM: 4 meq/L (ref 3.5–5.1)
Sodium: 145 mEq/L (ref 136–145)
Total Protein: 7 g/dL (ref 6.4–8.3)

## 2014-09-07 LAB — CBC WITH DIFFERENTIAL/PLATELET
BASO%: 0.6 % (ref 0.0–2.0)
BASOS ABS: 0 10*3/uL (ref 0.0–0.1)
EOS%: 2 % (ref 0.0–7.0)
Eosinophils Absolute: 0.1 10*3/uL (ref 0.0–0.5)
HEMATOCRIT: 37.3 % (ref 34.8–46.6)
HGB: 12.5 g/dL (ref 11.6–15.9)
LYMPH%: 34.5 % (ref 14.0–49.7)
MCH: 28.8 pg (ref 25.1–34.0)
MCHC: 33.5 g/dL (ref 31.5–36.0)
MCV: 85.9 fL (ref 79.5–101.0)
MONO#: 0.4 10*3/uL (ref 0.1–0.9)
MONO%: 6.7 % (ref 0.0–14.0)
NEUT#: 3.7 10*3/uL (ref 1.5–6.5)
NEUT%: 56.2 % (ref 38.4–76.8)
Platelets: 254 10*3/uL (ref 145–400)
RBC: 4.34 10*6/uL (ref 3.70–5.45)
RDW: 14.4 % (ref 11.2–14.5)
WBC: 6.6 10*3/uL (ref 3.9–10.3)
lymph#: 2.3 10*3/uL (ref 0.9–3.3)

## 2014-09-07 MED ORDER — DENOSUMAB 60 MG/ML ~~LOC~~ SOLN
60.0000 mg | Freq: Once | SUBCUTANEOUS | Status: AC
  Administered 2014-09-07: 60 mg via SUBCUTANEOUS
  Filled 2014-09-07: qty 1

## 2014-09-07 NOTE — Telephone Encounter (Signed)
per pof to sch pt appt-gave pt copy of sch-added inj for 11/18 per pof

## 2014-09-07 NOTE — Progress Notes (Signed)
ID: Kendra Schultz OB: [birth year]  MR#: [id number]  VXB#:[id number]  PCP: Imelda Pillow, NP GYN:   SU: Dr. Brantley Stage OTHER MD:  CHIEF COMPLAINT: Patient with h/o right sided invasive ductal carcinoma here for follow up.    BREAST CANCER HISTORY:  Kendra Schultz originally underwent a screening mammogram in October 2012 that showed scattered calcifications. 08/23/2011 patient had a diagnostic mammogram that showed a right sided 4 mm cluster of calcifications. She had biopsy performed on 08/27/2011 which revealed both invasive ductal carcinoma as well as DCIS with lymphovascular invasion. The invasive component was ER +89% PR +81% with Ki-67 9%. It was HER-2/neu negative.   CURRENT THERAPY:  Arimidex daily  INTERVAL HISTORY: Patient is here today for evaluation of her h/o right breast cancer.  She is taking Arimidex daily.  She does have some joint aches, but is otherwise doing well.  She did have two weeks of posterior headaches that have since resolved.  She denies pain, night sweats or unintentional weight loss.  She did receive Prolia last 6 months ago and tolerated it well.  Patient did undergo CT abdomen pelvis due to abdominal fullness.  It was negative for ascites or metastatic breast cancer.  She is not exercising.  She otherwise is well and w/o questions/concerns.    REVIEW OF SYSTEMS:A 10 point review of systems was conducted and is otherwise negative except for what is noted above.     PAST MEDICAL HISTORY: Past Medical History  Diagnosis Date  . GERD (gastroesophageal reflux disease)   . Arthritis   . Anemia   . Hypertension   . Cancer 1995, 2012    breast bilateral   . Breast cancer 09/06/2013    PAST SURGICAL HISTORY: Past Surgical History  Procedure Laterality Date  . Mastectomy  1995    left  . Breast lumpectomy  1997    right  . Appendectomy    . Ovarian cyst removal    . Tonsillectomy    . Mastectomy w/ sentinel node biopsy  09/17/2011    Procedure:  MASTECTOMY WITH SENTINEL LYMPH NODE BIOPSY;  Surgeon: Joyice Faster. Cornett, MD;  Location: [facility] OR;  Service: General;  Laterality: Right;  nuclear medicine injection 30 minutes prior for surgery     FAMILY HISTORY Family History  Problem Relation Age of Onset  . Heart disease Father   . Cancer Father     lymphoma  . Heart disease Brother     GYNECOLOGIC HISTORY: menarche 17, G2 P2, was on birth control previously, without any problems, no h/o sexually transmitted infections  SOCIAL HISTORY: Lives with mother, sister, and 2 sons ages 65 and 1.  Patient is retired, she enjoys puzzles and shopping with her free time.     ADVANCED DIRECTIVES: not in place   HEALTH MAINTENANCE: History  Substance Use Topics  . Smoking status: Former Smoker -- 1.00 packs/day    Types: Cigarettes    Quit date: 11/22/2011  . Smokeless tobacco: Not on file  . Alcohol Use: No      Mammogram: 08/2011, s/p bilateral mastectomy Colonoscopy: no, refuses to have one Bone Density Scan: 03/28/2014 Pap Smear: last in about 2009 Eye Exam: 03/10/14 Vitamin D Level:  06/2012 Lipid Panel: 2015   Allergies  Allergen Reactions  . Cephalexin Nausea And Vomiting    Current Outpatient Prescriptions  Medication Sig Dispense Refill  . anastrozole (ARIMIDEX) 1 MG tablet Take 1 tablet (1 mg total) by mouth daily. 30 tablet 11  .  benazepril-hydrochlorthiazide (LOTENSIN HCT) 20-25 MG per tablet Take 1 tablet by mouth daily.    Marland Kitchen CALCIUM PO Take 1,200 mg by mouth daily.    . Cholecalciferol (VITAMIN D PO) Take 2,000 Units by mouth daily.     . Cyclobenzaprine HCl (FLEXERIL PO) Take 1 tablet by mouth daily as needed.    . famotidine (PEPCID) 10 MG tablet Take 10 mg by mouth 2 (two) times daily as needed.     Marland Kitchen Fexofenadine HCl (ALLEGRA PO) Take by mouth daily as needed.     Marland Kitchen ibuprofen (ADVIL,MOTRIN) 200 MG tablet Take 400-600 mg by mouth every 6 (six) hours as needed. For pain     . albuterol (PROVENTIL HFA;VENTOLIN  HFA) 108 (90 BASE) MCG/ACT inhaler Inhale 2 puffs into the lungs every 6 (six) hours as needed for wheezing.    . beclomethasone (QVAR) 40 MCG/ACT inhaler Inhale 2 puffs into the lungs as needed.     No current facility-administered medications for this visit.    OBJECTIVE: Filed Vitals:   09/07/14 1339  BP: 135/60  Pulse: 62  Temp: 97.9 F (36.6 C)  Resp: 18     Body mass index is 31.27 kg/(m^2).     GENERAL: Patient is a well appearing female in no acute distress HEENT:  Sclerae anicteric.  Oropharynx clear and moist. No ulcerations or evidence of oropharyngeal candidiasis. Neck is supple.  NODES:  No cervical, supraclavicular, or axillary lymphadenopathy palpated.  BREAST EXAM: s/p bilateral mastectomies, no nodularity, no skin change, no sign of recurrence.  Benign bilateral breast exam. LUNGS:  Clear to auscultation bilaterally.  No wheezes or rhonchi. HEART:  Regular rate and rhythm. No murmur appreciated. ABDOMEN:  Soft, nontender.  Positive, normoactive bowel sounds. No organomegaly palpated. MSK:  No focal spinal tenderness to palpation. Full range of motion bilaterally in the upper extremities. EXTREMITIES:  No peripheral edema.   SKIN:  Clear with no obvious rashes or skin changes. No nail dyscrasia. NEURO:  Nonfocal. Well oriented.  Appropriate affect. ECOG FS:1 - Symptomatic but completely ambulatory  LAB RESULTS:  CMP     Component Value Date/Time   NA 143 03/09/2014 1230   NA 142 01/07/2012 1256   K 4.0 03/09/2014 1230   K 3.9 01/07/2012 1256   CL 105 07/14/2012 1024   CL 106 01/07/2012 1256   CO2 24 03/09/2014 1230   CO2 22 01/07/2012 1256   GLUCOSE 75 03/09/2014 1230   GLUCOSE 80 07/14/2012 1024   GLUCOSE 149* 01/07/2012 1256   BUN 18.1 03/09/2014 1230   BUN 19 01/07/2012 1256   CREATININE 0.9 03/09/2014 1230   CREATININE 0.81 01/07/2012 1256   CALCIUM 10.0 03/09/2014 1230   CALCIUM 9.5 01/07/2012 1256   PROT 7.3 03/09/2014 1230   PROT 6.7  01/07/2012 1256   ALBUMIN 4.0 03/09/2014 1230   ALBUMIN 4.3 01/07/2012 1256   AST 35* 03/09/2014 1230   AST 21 01/07/2012 1256   ALT 69* 03/09/2014 1230   ALT 38* 01/07/2012 1256   ALKPHOS 75 03/09/2014 1230   ALKPHOS 70 01/07/2012 1256   BILITOT 0.31 03/09/2014 1230   BILITOT 0.4 01/07/2012 1256   GFRNONAA 86* 09/17/2011 1738   GFRAA >90 09/17/2011 1738    I No results found for: SPEP  Lab Results  Component Value Date   WBC 6.6 09/07/2014   NEUTROABS 3.7 09/07/2014   HGB 12.5 09/07/2014   HCT 37.3 09/07/2014   MCV 85.9 09/07/2014   PLT 254 09/07/2014  Chemistry      Component Value Date/Time   NA 143 03/09/2014 1230   NA 142 01/07/2012 1256   K 4.0 03/09/2014 1230   K 3.9 01/07/2012 1256   CL 105 07/14/2012 1024   CL 106 01/07/2012 1256   CO2 24 03/09/2014 1230   CO2 22 01/07/2012 1256   BUN 18.1 03/09/2014 1230   BUN 19 01/07/2012 1256   CREATININE 0.9 03/09/2014 1230   CREATININE 0.81 01/07/2012 1256      Component Value Date/Time   CALCIUM 10.0 03/09/2014 1230   CALCIUM 9.5 01/07/2012 1256   ALKPHOS 75 03/09/2014 1230   ALKPHOS 70 01/07/2012 1256   AST 35* 03/09/2014 1230   AST 21 01/07/2012 1256   ALT 69* 03/09/2014 1230   ALT 38* 01/07/2012 1256   BILITOT 0.31 03/09/2014 1230   BILITOT 0.4 01/07/2012 1256       Lab Results  Component Value Date   LABCA2 21 09/04/2011    No components found for: YYQMG500  No results for input(s): INR in the last 168 hours.  Urinalysis No results found for: COLORURINE  STUDIES: No results found.  ASSESSMENT: 68 y.o. [place], [place] woman with h/o right breast T1N0 stage IA invasive ductal carcinoma, grade II, ER 89%, PR 81%, Ki-67 9%, HER-2/neu negative.  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  1.1995 patient had a mastectomy on the left side that showed a DCIS she was not given adjuvant hormonal therapy. This was then off load [place]. In 1997 patient had a lumpectomy of the right side  but she did not receive any adjuvant radiation or chemotherapy.   2. Patient went on to have a right mastectomy mastectomy with sentinel lymph node biopsy performed on September 17, 2011. The final pathology revealed only DCIS without any evidence of invasive cancer sentinel nodes were negative.    3.  After mastectomy of the right breast  On 09/17/2011 the patient proceeded with antiestrogen therapy consisting of Arimidex 1 mg daily.  4. Osteoporosis: Patient was previously on Fosamax but had difficulty tolerating it.  She will start Prolia given every 6 months on 03/09/14.    PLAN:  Mazi is doing well today.  She has no sign of recurrence.  Her CBC is normal and I reviewed this with her in detail. A CMP is pending.  She will continue taking Arimidex.    She will proceed with prolia for her osteoporosis.  She is due for repeat DEXA in 03/2016.  She is tolerating prolia well.    She was recommended healthy diet, exercise, and monthly breast exams.    The patient will return in 6 months for labs, evaluation, and an injection appointment.   She knows to call us in the interim for any questions or concerns.  We can certainly see her sooner if needed.  I spent 25 minutes counseling the patient face to face.  The total time spent in the appointment was 30 minutes.   Minette Headland, [place] [phone] 09/07/2014 1:45 PM

## 2014-09-07 NOTE — Patient Instructions (Signed)
Denosumab injection What is this medicine? DENOSUMAB (den oh sue mab) slows bone breakdown. Prolia is used to treat osteoporosis in women after menopause and in men. Xgeva is used to prevent bone fractures and other bone problems caused by cancer bone metastases. Xgeva is also used to treat giant cell tumor of the bone. This medicine may be used for other purposes; ask your health care provider or pharmacist if you have questions. COMMON BRAND NAME(S): Prolia, XGEVA What should I tell my health care provider before I take this medicine? They need to know if you have any of these conditions: -dental disease -eczema -infection or history of infections -kidney disease or on dialysis -low blood calcium or vitamin D -malabsorption syndrome -scheduled to have surgery or tooth extraction -taking medicine that contains denosumab -thyroid or parathyroid disease -an unusual reaction to denosumab, other medicines, foods, dyes, or preservatives -pregnant or trying to get pregnant -breast-feeding How should I use this medicine? This medicine is for injection under the skin. It is given by a health care professional in a hospital or clinic setting. If you are getting Prolia, a special MedGuide will be given to you by the pharmacist with each prescription and refill. Be sure to read this information carefully each time. For Prolia, talk to your pediatrician regarding the use of this medicine in children. Special care may be needed. For Xgeva, talk to your pediatrician regarding the use of this medicine in children. While this drug may be prescribed for children as young as 13 years for selected conditions, precautions do apply. Overdosage: If you think you've taken too much of this medicine contact a poison control center or emergency room at once. Overdosage: If you think you have taken too much of this medicine contact a poison control center or emergency room at once. NOTE: This medicine is only for  you. Do not share this medicine with others. What if I miss a dose? It is important not to miss your dose. Call your doctor or health care professional if you are unable to keep an appointment. What may interact with this medicine? Do not take this medicine with any of the following medications: -other medicines containing denosumab This medicine may also interact with the following medications: -medicines that suppress the immune system -medicines that treat cancer -steroid medicines like prednisone or cortisone This list may not describe all possible interactions. Give your health care provider a list of all the medicines, herbs, non-prescription drugs, or dietary supplements you use. Also tell them if you smoke, drink alcohol, or use illegal drugs. Some items may interact with your medicine. What should I watch for while using this medicine? Visit your doctor or health care professional for regular checks on your progress. Your doctor or health care professional may order blood tests and other tests to see how you are doing. Call your doctor or health care professional if you get a cold or other infection while receiving this medicine. Do not treat yourself. This medicine may decrease your body's ability to fight infection. You should make sure you get enough calcium and vitamin D while you are taking this medicine, unless your doctor tells you not to. Discuss the foods you eat and the vitamins you take with your health care professional. See your dentist regularly. Brush and floss your teeth as directed. Before you have any dental work done, tell your dentist you are receiving this medicine. Do not become pregnant while taking this medicine or for 5 months after stopping   it. Women should inform their doctor if they wish to become pregnant or think they might be pregnant. There is a potential for serious side effects to an unborn child. Talk to your health care professional or pharmacist for more  information. What side effects may I notice from receiving this medicine? Side effects that you should report to your doctor or health care professional as soon as possible: -allergic reactions like skin rash, itching or hives, swelling of the face, lips, or tongue -breathing problems -chest pain -fast, irregular heartbeat -feeling faint or lightheaded, falls -fever, chills, or any other sign of infection -muscle spasms, tightening, or twitches -numbness or tingling -skin blisters or bumps, or is dry, peels, or red -slow healing or unexplained pain in the mouth or jaw -unusual bleeding or bruising Side effects that usually do not require medical attention (Report these to your doctor or health care professional if they continue or are bothersome.): -muscle pain -stomach upset, gas This list may not describe all possible side effects. Call your doctor for medical advice about side effects. You may report side effects to FDA at 1-800-FDA-1088. Where should I keep my medicine? This medicine is only given in a clinic, doctor's office, or other health care setting and will not be stored at home. NOTE: This sheet is a summary. It may not cover all possible information. If you have questions about this medicine, talk to your doctor, pharmacist, or health care provider.  2015, [employer]. (2012-04-06 12:37:47)  

## 2014-09-07 NOTE — Patient Instructions (Signed)
You are doing well. You have no sign of recurrence.  I recommend healthy diet, exercise, monthly breast exams.  Continue taking Arimidex.  Breast Self-Awareness Practicing breast self-awareness may pick up problems early, prevent significant medical complications, and possibly save your life. By practicing breast self-awareness, you can become familiar with how your breasts look and feel and if your breasts are changing. This allows you to notice changes early. It can also offer you some reassurance that your breast health is good. One way to learn what is normal for your breasts and whether your breasts are changing is to do a breast self-exam. If you find a lump or something that was not present in the past, it is best to contact your caregiver right away. Other findings that should be evaluated by your caregiver include nipple discharge, especially if it is bloody; skin changes or reddening; areas where the skin seems to be pulled in (retracted); or new lumps and bumps. Breast pain is seldom associated with cancer (malignancy), but should also be evaluated by a caregiver. HOW TO PERFORM A BREAST SELF-EXAM The best time to examine your breasts is 5-7 days after your menstrual period is over. During menstruation, the breasts are lumpier, and it may be more difficult to pick up changes. If you do not menstruate, have reached menopause, or had your uterus removed (hysterectomy), you should examine your breasts at regular intervals, such as monthly. If you are breastfeeding, examine your breasts after a feeding or after using a breast pump. Breast implants do not decrease the risk for lumps or tumors, so continue to perform breast self-exams as recommended. Talk to your caregiver about how to determine the difference between the implant and breast tissue. Also, talk about the amount of pressure you should use during the exam. Over time, you will become more familiar with the variations of your breasts and more  comfortable with the exam. A breast self-exam requires you to remove all your clothes above the waist. 1. Look at your breasts and nipples. Stand in front of a mirror in a room with good lighting. With your hands on your hips, push your hands firmly downward. Look for a difference in shape, contour, and size from one breast to the other (asymmetry). Asymmetry includes puckers, dips, or bumps. Also, look for skin changes, such as reddened or scaly areas on the breasts. Look for nipple changes, such as discharge, dimpling, repositioning, or redness. 2. Carefully feel your breasts. This is best done either in the shower or tub while using soapy water or when flat on your back. Place the arm (on the side of the breast you are examining) above your head. Use the pads (not the fingertips) of your three middle fingers on your opposite hand to feel your breasts. Start in the underarm area and use  inch (2 cm) overlapping circles to feel your breast. Use 3 different levels of pressure (light, medium, and firm pressure) at each circle before moving to the next circle. The light pressure is needed to feel the tissue closest to the skin. The medium pressure will help to feel breast tissue a little deeper, while the firm pressure is needed to feel the tissue close to the ribs. Continue the overlapping circles, moving downward over the breast until you feel your ribs below your breast. Then, move one finger-width towards the center of the body. Continue to use the  inch (2 cm) overlapping circles to feel your breast as you move slowly up toward  the collar bone (clavicle) near the base of the neck. Continue the up and down exam using all 3 pressures until you reach the middle of the chest. Do this with each breast, carefully feeling for lumps or changes. 3.  Keep a written record with breast changes or normal findings for each breast. By writing this information down, you do not need to depend only on memory for size,  tenderness, or location. Write down where you are in your menstrual cycle, if you are still menstruating. Breast tissue can have some lumps or thick tissue. However, see your caregiver if you find anything that concerns you.  SEEK MEDICAL CARE IF:  You see a change in shape, contour, or size of your breasts or nipples.   You see skin changes, such as reddened or scaly areas on the breasts or nipples.   You have an unusual discharge from your nipples.   You feel a new lump or unusually thick areas.  Document Released: 10/07/2005 Document Revised: 09/23/2012 Document Reviewed: 01/22/2012 [facility] Patient Information 2015 [place], [place]. This information is not intended to replace advice given to you by your health care provider. Make sure you discuss any questions you have with your health care provider.

## 2014-09-08 LAB — VITAMIN D 25 HYDROXY (VIT D DEFICIENCY, FRACTURES): VIT D 25 HYDROXY: 57 ng/mL (ref 30–100)

## 2015-03-10 ENCOUNTER — Other Ambulatory Visit: Payer: Self-pay | Admitting: *Deleted

## 2015-03-10 DIAGNOSIS — C50919 Malignant neoplasm of unspecified site of unspecified female breast: Secondary | ICD-10-CM

## 2015-03-13 ENCOUNTER — Other Ambulatory Visit (HOSPITAL_BASED_OUTPATIENT_CLINIC_OR_DEPARTMENT_OTHER): Payer: Medicare Other

## 2015-03-13 ENCOUNTER — Telehealth: Payer: Self-pay | Admitting: Hematology

## 2015-03-13 ENCOUNTER — Encounter: Payer: Self-pay | Admitting: Hematology

## 2015-03-13 ENCOUNTER — Ambulatory Visit (HOSPITAL_BASED_OUTPATIENT_CLINIC_OR_DEPARTMENT_OTHER): Payer: Medicare Other | Admitting: Hematology

## 2015-03-13 ENCOUNTER — Ambulatory Visit (HOSPITAL_BASED_OUTPATIENT_CLINIC_OR_DEPARTMENT_OTHER): Payer: Medicare Other

## 2015-03-13 VITALS — BP 159/91 | HR 62 | Temp 98.7°F | Resp 18 | Ht 61.0 in | Wt 163.4 lb

## 2015-03-13 DIAGNOSIS — C50111 Malignant neoplasm of central portion of right female breast: Secondary | ICD-10-CM

## 2015-03-13 DIAGNOSIS — C50911 Malignant neoplasm of unspecified site of right female breast: Secondary | ICD-10-CM

## 2015-03-13 DIAGNOSIS — D0512 Intraductal carcinoma in situ of left breast: Secondary | ICD-10-CM

## 2015-03-13 DIAGNOSIS — C50919 Malignant neoplasm of unspecified site of unspecified female breast: Secondary | ICD-10-CM

## 2015-03-13 DIAGNOSIS — Z170 Estrogen receptor positive status [ER+]: Secondary | ICD-10-CM | POA: Diagnosis not present

## 2015-03-13 DIAGNOSIS — M810 Age-related osteoporosis without current pathological fracture: Secondary | ICD-10-CM

## 2015-03-13 LAB — COMPREHENSIVE METABOLIC PANEL (CC13)
ALBUMIN: 4.1 g/dL (ref 3.5–5.0)
ALK PHOS: 62 U/L (ref 40–150)
ALT: 89 U/L — ABNORMAL HIGH (ref 0–55)
AST: 53 U/L — ABNORMAL HIGH (ref 5–34)
Anion Gap: 14 mEq/L — ABNORMAL HIGH (ref 3–11)
BUN: 22.6 mg/dL (ref 7.0–26.0)
CALCIUM: 10.1 mg/dL (ref 8.4–10.4)
CO2: 22 meq/L (ref 22–29)
CREATININE: 1 mg/dL (ref 0.6–1.1)
Chloride: 105 mEq/L (ref 98–109)
EGFR: 58 mL/min/{1.73_m2} — ABNORMAL LOW (ref >90)
GLUCOSE: 103 mg/dL (ref 70–140)
Potassium: 4.2 mEq/L (ref 3.5–5.1)
Sodium: 140 mEq/L (ref 136–145)
TOTAL PROTEIN: 7.5 g/dL (ref 6.4–8.3)
Total Bilirubin: 0.39 mg/dL (ref 0.20–1.20)

## 2015-03-13 LAB — CBC WITH DIFFERENTIAL/PLATELET
BASO%: 0.7 % (ref 0.0–2.0)
Basophils Absolute: 0.1 10*3/uL (ref 0.0–0.1)
EOS%: 2.3 % (ref 0.0–7.0)
Eosinophils Absolute: 0.2 10*3/uL (ref 0.0–0.5)
HCT: 41.1 % (ref 34.8–46.6)
HEMOGLOBIN: 13.6 g/dL (ref 11.6–15.9)
LYMPH#: 2.1 10*3/uL (ref 0.9–3.3)
LYMPH%: 30.9 % (ref 14.0–49.7)
MCH: 28 pg (ref 25.1–34.0)
MCHC: 33 g/dL (ref 31.5–36.0)
MCV: 84.9 fL (ref 79.5–101.0)
MONO#: 0.4 10*3/uL (ref 0.1–0.9)
MONO%: 6.5 % (ref 0.0–14.0)
NEUT%: 59.6 % (ref 38.4–76.8)
NEUTROS ABS: 4.1 10*3/uL (ref 1.5–6.5)
PLATELETS: 282 10*3/uL (ref 145–400)
RBC: 4.84 10*6/uL (ref 3.70–5.45)
RDW: 14.4 % (ref 11.2–14.5)
WBC: 6.8 10*3/uL (ref 3.9–10.3)

## 2015-03-13 MED ORDER — DENOSUMAB 60 MG/ML ~~LOC~~ SOLN
60.0000 mg | Freq: Once | SUBCUTANEOUS | Status: AC
  Administered 2015-03-13: 60 mg via SUBCUTANEOUS
  Filled 2015-03-13: qty 1

## 2015-03-13 MED ORDER — EXEMESTANE 25 MG PO TABS: 25.0000 mg | ORAL_TABLET | Freq: Every day | ORAL | Status: DC

## 2015-03-13 NOTE — Progress Notes (Signed)
ID: [name] OB: [birth year]  MR#: [id number]  CSN#:[id number]  PCP: Imelda Pillow, NP GYN:   SU: Dr. Brantley Stage OTHER MD:  CHIEF COMPLAINT: Patient with h/o right sided invasive ductal carcinoma here for follow up.    BREAST CANCER HISTORY:   Breast cancer   1995 Cancer Diagnosis 1995 patient had a mastectomy on the left side that showed a DCIS she was not given adjuvant hormonal therapy. This was then off load [place]. In 1997 patient had a lumpectomy of the right side but she did not receive any adjuvant radiation or chemothera   08/27/2011 Initial Biopsy right breast core biopsy, showed IDA and DCIS    09/07/2011 Receptors her2 ER 89%+, PR 9%+, HER2-, Ki67 9%    09/17/2011 -  Anti-estrogen oral therapy Arimidex $RemoveBeforeDE'1mg'vnacCIujPeHvCtD$  dialy    09/22/2011 Surgery Right mastectomy, path revealed DCIS, no residual invasive tumor (previous biopsy IDA 0.6cm, grade II), negative margines, one node negative   09/23/2011 Miscellaneous Genetic test: BRCA1(-), BRCA2 (-)    09/25/2011 Oncotype testing RS 10, predicts 10-year recurrence risk of 7% with Tamoxifen alone. low risk.      CURRENT THERAPY:  Arimidex daily  INTERVAL HISTORY: Patient is here today for evaluation of her h/o right breast cancer.  She is taking Arimidex daily. No significant hot flash. She complains of worsening pain in her wrists, thumbs, and other joints. She takes naproxen sometime with some relieve. She was seen by her PCP for that. She otherwise doing well, has good appetite and energy level, no other complaints. She is taking calcium and Vit D, had dental visit in Feb, 2016.   REVIEW OF SYSTEMS:A 10 point review of systems was conducted and is otherwise negative except for what is noted above.     PAST MEDICAL HISTORY: Past Medical History  Diagnosis Date  . GERD (gastroesophageal reflux disease)   . Arthritis   . Anemia   . Hypertension   . Cancer 1995, 2012    breast bilateral   . Breast cancer 09/06/2013    PAST  SURGICAL HISTORY: Past Surgical History  Procedure Laterality Date  . Mastectomy  1995    left  . Breast lumpectomy  1997    right  . Appendectomy    . Ovarian cyst removal    . Tonsillectomy    . Mastectomy w/ sentinel node biopsy  09/17/2011    Procedure: MASTECTOMY WITH SENTINEL LYMPH NODE BIOPSY;  Surgeon: Joyice Faster. Cornett, MD;  Location: [facility] OR;  Service: General;  Laterality: Right;  nuclear medicine injection 30 minutes prior for surgery     FAMILY HISTORY Family History  Problem Relation Age of Onset  . Heart disease Father   . Cancer Father     lymphoma  . Heart disease Brother     GYNECOLOGIC HISTORY: menarche 57, G2 P2, was on birth control previously, without any problems, no h/o sexually transmitted infections  SOCIAL HISTORY: Lives with mother, sister, and 2 sons ages 14 and 13.  Patient is retired, she enjoys puzzles and shopping with her free time.     ADVANCED DIRECTIVES: not in place   HEALTH MAINTENANCE: History  Substance Use Topics  . Smoking status: Former Smoker -- 1.00 packs/day    Types: Cigarettes    Quit date: 11/22/2011  . Smokeless tobacco: Not on file  . Alcohol Use: No      Mammogram: 08/2011, s/p bilateral mastectomy Colonoscopy: no, refuses to have one Bone Density Scan: 03/28/2014 Pap Smear:  last in about 2009 Eye Exam: 03/10/14 Vitamin D Level:  06/2012 Lipid Panel: 2015   Allergies  Allergen Reactions  . Cephalexin Nausea And Vomiting    Current Outpatient Prescriptions  Medication Sig Dispense Refill  . albuterol (PROVENTIL HFA;VENTOLIN HFA) 108 (90 BASE) MCG/ACT inhaler Inhale 2 puffs into the lungs every 6 (six) hours as needed for wheezing.    Marland Kitchen amLODipine (NORVASC) 2.5 MG tablet Take 2.5 mg by mouth daily.  0  . anastrozole (ARIMIDEX) 1 MG tablet Take 1 tablet (1 mg total) by mouth daily. 30 tablet 11  . beclomethasone (QVAR) 40 MCG/ACT inhaler Inhale 2 puffs into the lungs as needed.    . benazepril-hydrochlorthiazide  (LOTENSIN HCT) 20-25 MG per tablet Take 1 tablet by mouth daily.    Marland Kitchen CALCIUM PO Take 1,200 mg by mouth daily.    . Cholecalciferol (VITAMIN D PO) Take 2,000 Units by mouth daily.     . Cyclobenzaprine HCl (FLEXERIL PO) Take 1 tablet by mouth daily as needed.    . famotidine (PEPCID) 10 MG tablet Take 10 mg by mouth 2 (two) times daily as needed.     Marland Kitchen Fexofenadine HCl (ALLEGRA PO) Take by mouth daily as needed.     Marland Kitchen ibuprofen (ADVIL,MOTRIN) 200 MG tablet Take 400-600 mg by mouth every 6 (six) hours as needed. For pain     . naproxen (NAPROSYN) 500 MG tablet Take 500 mg by mouth 2 (two) times daily. for 10 days  0  . exemestane (AROMASIN) 25 MG tablet Take 1 tablet (25 mg total) by mouth daily after breakfast. 30 tablet 2   No current facility-administered medications for this visit.    OBJECTIVE: Filed Vitals:   03/13/15 1331  BP: 159/91  Pulse: 62  Temp: 98.7 F (37.1 C)  Resp: 18     Body mass index is 30.89 kg/(m^2).     GENERAL: Patient is a well appearing female in no acute distress HEENT:  Sclerae anicteric.  Oropharynx clear and moist. No ulcerations or evidence of oropharyngeal candidiasis. Neck is supple.  NODES:  No cervical, supraclavicular, or axillary lymphadenopathy palpated.  BREAST EXAM: s/p bilateral mastectomies, no nodularity, no skin change, no sign of recurrence.  Benign bilateral breast exam. LUNGS:  Clear to auscultation bilaterally.  No wheezes or rhonchi. HEART:  Regular rate and rhythm. No murmur appreciated. ABDOMEN:  Soft, nontender.  Positive, normoactive bowel sounds. No organomegaly palpated. MSK:  No focal spinal tenderness to palpation. Full range of motion bilaterally in the upper extremities. EXTREMITIES:  No peripheral edema.   SKIN:  Clear with no obvious rashes or skin changes. No nail dyscrasia. NEURO:  Nonfocal. Well oriented.  Appropriate affect. ECOG FS:1 - Symptomatic but completely ambulatory  LAB RESULTS: CBC Latest Ref Rng 03/13/2015  09/07/2014 03/09/2014  WBC 3.9 - 10.3 10e3/uL 6.8 6.6 6.6  Hemoglobin 11.6 - 15.9 g/dL 13.6 12.5 13.2  Hematocrit 34.8 - 46.6 % 41.1 37.3 40.0  Platelets 145 - 400 10e3/uL 282 254 271    CMP Latest Ref Rng 03/13/2015 09/07/2014 03/09/2014  Glucose 70 - 140 mg/dl 103 93 75  BUN 7.0 - 26.0 mg/dL 22.6 15.4 18.1  Creatinine 0.6 - 1.1 mg/dL 1.0 1.0 0.9  Sodium 136 - 145 mEq/L 140 145 143  Potassium 3.5 - 5.1 mEq/L 4.2 4.0 4.0  Chloride 98 - 107 mEq/L - - -  CO2 22 - 29 mEq/L _0 Calcium 8.4 - 10.4 mg/dL 10.1 10.2 10.0  Total Protein 6.4 - 8.3 g/dL 7.5 7.0 7.3  Total Bilirubin 0.20 - 1.20 mg/dL 0.39 0.29 0.31  Alkaline Phos 40 - 150 U/L 62 65 75  AST 5 - 34 U/L 53(H) 55(H) 35(H)  ALT 0 - 55 U/L 89(H) 97(H) 69(H)     STUDIES: Bone density scan on 03/28/2014 - osteoporosis,T score -3.5   ASSESSMENT: 69 y.o. [place], [place] woman   1. 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 S/p bilateral mastectomy -She is clinically doing well overall - today's physical exam revealed no evidence of recurrence -Given her significant arthralgia, I'll switch her from Arimidex to Aromasin, to see if she can tolerate it better. I told her to stop her Arimidex for 1 week, to start Aromasin. Prescription of Aromasin was sent to her pharmacy today. -We also reviewed the answer side effects from aromatase inhibitor, such as worsening of her osteoporosis. -She will continue surveillance with physical exam. -We discussed healthy diet and regular exercise.  4. Osteoporosis:  -Patient was previously on Fosamax but had difficulty tolerating it.   -She will  continue Prolia every 6 months, due today.  -Repeat bone density scan every 2 years.   The patient will return in 6 months for labs, evaluation, and an injection appointment.   She knows to call us in the interim for any  questions or concerns.  We can certainly see her sooner if needed.  I spent 25 minutes counseling the patient face to face.  The total time spent in the appointment was 30 minutes.   Truitt Merle   03/13/2015 9:45 PM

## 2015-03-13 NOTE — Telephone Encounter (Signed)
per pof ot sch pt appt-gave pt copy of sch °

## 2015-06-20 ENCOUNTER — Telehealth: Payer: Self-pay | Admitting: Hematology

## 2015-06-20 ENCOUNTER — Encounter: Payer: Self-pay | Admitting: Hematology

## 2015-06-20 ENCOUNTER — Ambulatory Visit (HOSPITAL_BASED_OUTPATIENT_CLINIC_OR_DEPARTMENT_OTHER): Payer: Medicare Other | Admitting: Hematology

## 2015-06-20 ENCOUNTER — Other Ambulatory Visit (HOSPITAL_BASED_OUTPATIENT_CLINIC_OR_DEPARTMENT_OTHER): Payer: Medicare Other

## 2015-06-20 VITALS — BP 136/85 | HR 58 | Temp 98.3°F | Resp 18 | Ht 61.0 in | Wt 158.5 lb

## 2015-06-20 DIAGNOSIS — D0512 Intraductal carcinoma in situ of left breast: Secondary | ICD-10-CM

## 2015-06-20 DIAGNOSIS — C50111 Malignant neoplasm of central portion of right female breast: Secondary | ICD-10-CM

## 2015-06-20 DIAGNOSIS — M2550 Pain in unspecified joint: Secondary | ICD-10-CM | POA: Diagnosis not present

## 2015-06-20 DIAGNOSIS — M810 Age-related osteoporosis without current pathological fracture: Secondary | ICD-10-CM

## 2015-06-20 DIAGNOSIS — C50911 Malignant neoplasm of unspecified site of right female breast: Secondary | ICD-10-CM

## 2015-06-20 LAB — COMPREHENSIVE METABOLIC PANEL (CC13)
ALT: 54 U/L (ref 0–55)
ANION GAP: 11 meq/L (ref 3–11)
AST: 31 U/L (ref 5–34)
Albumin: 4.1 g/dL (ref 3.5–5.0)
Alkaline Phosphatase: 56 U/L (ref 40–150)
BUN: 20.1 mg/dL (ref 7.0–26.0)
CHLORIDE: 107 meq/L (ref 98–109)
CO2: 24 meq/L (ref 22–29)
Calcium: 9.5 mg/dL (ref 8.4–10.4)
Creatinine: 1 mg/dL (ref 0.6–1.1)
EGFR: 59 mL/min/{1.73_m2} — ABNORMAL LOW (ref >90)
Glucose: 98 mg/dl (ref 70–140)
Potassium: 4.2 mEq/L (ref 3.5–5.1)
Sodium: 142 mEq/L (ref 136–145)
Total Bilirubin: 0.61 mg/dL (ref 0.20–1.20)
Total Protein: 7.3 g/dL (ref 6.4–8.3)

## 2015-06-20 LAB — CBC WITH DIFFERENTIAL/PLATELET
BASO%: 0.8 % (ref 0.0–2.0)
Basophils Absolute: 0 10*3/uL (ref 0.0–0.1)
EOS ABS: 0.2 10*3/uL (ref 0.0–0.5)
EOS%: 2.9 % (ref 0.0–7.0)
HCT: 39.1 % (ref 34.8–46.6)
HGB: 13.2 g/dL (ref 11.6–15.9)
LYMPH%: 27.7 % (ref 14.0–49.7)
MCH: 28.3 pg (ref 25.1–34.0)
MCHC: 33.7 g/dL (ref 31.5–36.0)
MCV: 83.8 fL (ref 79.5–101.0)
MONO#: 0.4 10*3/uL (ref 0.1–0.9)
MONO%: 7.7 % (ref 0.0–14.0)
NEUT#: 3.4 10*3/uL (ref 1.5–6.5)
NEUT%: 60.9 % (ref 38.4–76.8)
PLATELETS: 253 10*3/uL (ref 145–400)
RBC: 4.67 10*6/uL (ref 3.70–5.45)
RDW: 14.4 % (ref 11.2–14.5)
WBC: 5.5 10*3/uL (ref 3.9–10.3)
lymph#: 1.5 10*3/uL (ref 0.9–3.3)

## 2015-06-20 MED ORDER — TAMOXIFEN CITRATE 20 MG PO TABS: 20.0000 mg | ORAL_TABLET | Freq: Every day | ORAL | Status: DC

## 2015-06-20 MED ORDER — ESOMEPRAZOLE MAGNESIUM 20 MG PO CPDR: 20.0000 mg | DELAYED_RELEASE_CAPSULE | Freq: Every day | ORAL | Status: DC

## 2015-06-20 NOTE — Progress Notes (Signed)
ID: [name] OB: 03-29-1946  MR#: [id number]  CSN#:[id number]  PCP: Imelda Pillow, NP GYN:   SU: Dr. Brantley Stage OTHER MD:  CHIEF COMPLAINT: Patient with h/o right sided invasive ductal carcinoma here for follow up.    BREAST CANCER HISTORY:   Breast cancer   1995 Cancer Diagnosis 1995 patient had a mastectomy on the left side that showed a DCIS she was not given adjuvant hormonal therapy. This was then off load [place]. In 1997 patient had a lumpectomy of the right side but she did not receive any adjuvant radiation or chemothera   08/27/2011 Initial Biopsy right breast core biopsy, showed IDA and DCIS    09/07/2011 Receptors her2 ER 89%+, PR 9%+, HER2-, Ki67 9%    09/22/2011 Surgery Right mastectomy, path revealed DCIS, no residual invasive tumor (previous biopsy IDA 0.6cm, grade II), negative margines, one node negative   09/23/2011 Miscellaneous Genetic test: BRCA1(-), BRCA2 (-)    09/25/2011 Oncotype testing RS 10, predicts 10-year recurrence risk of 7% with Tamoxifen alone. low risk.    10/17/2011 -  Anti-estrogen oral therapy Arimidex 7m dialy      CURRENT THERAPY:  Aromasin 279mdaily   INTERVAL HISTORY: Patient is here today for evaluation of her h/o right breast cancer.    I switched her from Arimidex to Aromasin 3 months ago, due to moderate to severe  Arthritis and joint pain.  She states that she still has similar joint pain,  Especially in her hands,  When she uses her hands.  She tried Tylenol and ibuprofen which did not help much. It does affect her day-to-day living and  Activities.  She also reports acid reflux with burning sensation daily in the past few weeks. She tried over-the-counter Zantac which did not help.   REVIEW OF SYSTEMS:A 10 point review of systems was conducted and is otherwise negative except for what is noted above.     PAST MEDICAL HISTORY: Past Medical History  Diagnosis Date  . GERD (gastroesophageal reflux disease)   . Arthritis   .  Anemia   . Hypertension   . Cancer 1995, 2012    breast bilateral   . Breast cancer 09/06/2013    PAST SURGICAL HISTORY: Past Surgical History  Procedure Laterality Date  . Mastectomy  1995    left  . Breast lumpectomy  1997    right  . Appendectomy    . Ovarian cyst removal    . Tonsillectomy    . Mastectomy w/ sentinel node biopsy  09/17/2011    Procedure: MASTECTOMY WITH SENTINEL LYMPH NODE BIOPSY;  Surgeon: ThJoyice FasterCornett, MD;  Location: [facility] OR;  Service: General;  Laterality: Right;  nuclear medicine injection 30 minutes prior for surgery     FAMILY HISTORY Family History  Problem Relation Age of Onset  . Heart disease Father   . Cancer Father     lymphoma  . Heart disease Brother     GYNECOLOGIC HISTORY: menarche [age]G2 P2, was on birth control previously, without any problems, no h/o sexually transmitted infections  SOCIAL HISTORY: Lives with mother, sister, and 2 sons ages 3465nd 3272 Patient is retired, she enjoys puzzles and shopping with her free time.     ADVANCED DIRECTIVES: not in place   HEALTH MAINTENANCE: Social History  Substance Use Topics  . Smoking status: Former Smoker -- 1.00 packs/day    Types: Cigarettes    Quit date: 11/22/2011  . Smokeless tobacco: None  .  Alcohol Use: No      Mammogram: 08/2011, s/p bilateral mastectomy Colonoscopy: no, refuses to have one Bone Density Scan: 03/28/2014 Pap Smear: last in about 2009 Eye Exam: 03/10/14 Vitamin D Level:  06/2012 Lipid Panel: 2015   Allergies  Allergen Reactions  . Cephalexin Nausea And Vomiting    Current Outpatient Prescriptions  Medication Sig Dispense Refill  . albuterol (PROVENTIL HFA;VENTOLIN HFA) 108 (90 BASE) MCG/ACT inhaler Inhale 2 puffs into the lungs every 6 (six) hours as needed for wheezing.    Marland Kitchen amLODipine (NORVASC) 2.5 MG tablet Take 2.5 mg by mouth daily.  0  . beclomethasone (QVAR) 40 MCG/ACT inhaler Inhale 2 puffs into the lungs as needed.    .  benazepril-hydrochlorthiazide (LOTENSIN HCT) 20-25 MG per tablet Take 1 tablet by mouth daily.    Marland Kitchen CALCIUM PO Take 1,200 mg by mouth daily.    . Cholecalciferol (VITAMIN D PO) Take 2,000 Units by mouth daily.     . Cyclobenzaprine HCl (FLEXERIL PO) Take 1 tablet by mouth daily as needed.    Marland Kitchen exemestane (AROMASIN) 25 MG tablet Take 1 tablet (25 mg total) by mouth daily after breakfast. 30 tablet 2  . famotidine (PEPCID) 10 MG tablet Take 10 mg by mouth 2 (two) times daily as needed.     Marland Kitchen Fexofenadine HCl (ALLEGRA PO) Take by mouth daily as needed.     Marland Kitchen ibuprofen (ADVIL,MOTRIN) 200 MG tablet Take 400-600 mg by mouth every 6 (six) hours as needed. For pain     . naproxen (NAPROSYN) 500 MG tablet Take 500 mg by mouth 2 (two) times daily. for 10 days  0  . esomeprazole (NEXIUM) 20 MG capsule Take 1 capsule (20 mg total) by mouth daily at 12 noon. 30 capsule 2  . tamoxifen (NOLVADEX) 20 MG tablet Take 1 tablet (20 mg total) by mouth daily. 30 tablet 2   No current facility-administered medications for this visit.    OBJECTIVE: Filed Vitals:   06/20/15 1256  BP: 136/85  Pulse: 58  Temp: 98.3 F (36.8 C)  Resp: 18     Body mass index is 29.96 kg/(m^2).     GENERAL: Patient is a well appearing female in no acute distress HEENT:  Sclerae anicteric.  Oropharynx clear and moist. No ulcerations or evidence of oropharyngeal candidiasis. Neck is supple.  NODES:  No cervical, supraclavicular, or axillary lymphadenopathy palpated.  BREAST EXAM: s/p bilateral mastectomies, no nodularity, no skin change, no sign of recurrence.  Benign bilateral breast exam. LUNGS:  Clear to auscultation bilaterally.  No wheezes or rhonchi. HEART:  Regular rate and rhythm. No murmur appreciated. ABDOMEN:  Soft, nontender.  Positive, normoactive bowel sounds. No organomegaly palpated. MSK:  No focal spinal tenderness to palpation. Full range of motion bilaterally in the upper extremities. EXTREMITIES:  No peripheral  edema.   SKIN:  Clear with no obvious rashes or skin changes. No nail dyscrasia. NEURO:  Nonfocal. Well oriented.  Appropriate affect. ECOG FS:1 - Symptomatic but completely ambulatory  LAB RESULTS: CBC Latest Ref Rng 06/20/2015 03/13/2015 09/07/2014  WBC 3.9 - 10.3 10e3/uL 5.5 6.8 6.6  Hemoglobin 11.6 - 15.9 g/dL 13.2 13.6 12.5  Hematocrit 34.8 - 46.6 % 39.1 41.1 37.3  Platelets 145 - 400 10e3/uL 253 282 254    CMP Latest Ref Rng 06/20/2015 03/13/2015 09/07/2014  Glucose 70 - 140 mg/dl 98 103 93  BUN 7.0 - 26.0 mg/dL 20.1 22.6 15.4  Creatinine 0.6 - 1.1 mg/dL 1.0 1.0  1.0  Sodium 136 - 145 mEq/L 142 140 145  Potassium 3.5 - 5.1 mEq/L 4.2 4.2 4.0  Chloride 98 - 107 mEq/L - - -  CO2 22 - 29 mEq/L _0 Calcium 8.4 - 10.4 mg/dL 9.5 10.1 10.2  Total Protein 6.4 - 8.3 g/dL 7.3 7.5 7.0  Total Bilirubin 0.20 - 1.20 mg/dL 0.61 0.39 0.29  Alkaline Phos 40 - 150 U/L 56 62 65  AST 5 - 34 U/L 31 53(H) 55(H)  ALT 0 - 55 U/L 54 89(H) 97(H)     STUDIES: Bone density scan on 03/28/2014 - osteoporosis,T score -3.5   ASSESSMENT: 69 y.o. [place], [place] woman   1. 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 S/p bilateral mastectomy -She is clinically doing well overall - today's physical exam revealed no evidence of recurrence -Given her significant arthralgia, I'll switch her from   Aromasin to tamoxifen.   I asked her to B of Aromasin 4 months, and start tamoxifen in early October. I sent a prescription to her pharmacy today. -We reviewed the  Potential side effects from  Tamoxifen, especially the small risk of thrombosis and endometrial cancer. It however it does increase her bone density which will be beneficial for her. -She will continue surveillance with physical exam. -We discussed healthy diet and regular exercise.  2. Osteoporosis:  -Patient was  previously on Fosamax but had difficulty tolerating it.   -She will  continue Prolia every 6 months, due  In 3 months  -Repeat bone density scan every 2 years.  3. GERD - she has tried  Zantac which did not help. - I gave her a prescription of Nexium today   Plan - Stopped Aromasin - Start tamoxifen on 07/22/2015,  Prescription was sent to her pharmacy today - Nexium for her acid reflux -RTC in 3 month with lab, and Prolia injection   I spent 25 minutes counseling the patient face to face.  The total time spent in the appointment was 30 minutes.   Truitt Merle   06/20/2015 1:46 PM

## 2015-06-20 NOTE — Telephone Encounter (Signed)
per pof to sch pt appt-gave pt copy of avs °

## 2015-08-28 ENCOUNTER — Ambulatory Visit: Payer: Medicare Other | Admitting: Hematology

## 2015-08-28 ENCOUNTER — Other Ambulatory Visit: Payer: Medicare Other

## 2015-08-28 ENCOUNTER — Ambulatory Visit: Payer: Medicare Other

## 2015-09-19 ENCOUNTER — Other Ambulatory Visit: Payer: Self-pay | Admitting: Hematology

## 2015-09-20 ENCOUNTER — Telehealth: Payer: Self-pay | Admitting: Hematology

## 2015-09-20 ENCOUNTER — Ambulatory Visit (HOSPITAL_BASED_OUTPATIENT_CLINIC_OR_DEPARTMENT_OTHER): Payer: Medicare Other

## 2015-09-20 ENCOUNTER — Ambulatory Visit (HOSPITAL_BASED_OUTPATIENT_CLINIC_OR_DEPARTMENT_OTHER): Payer: Medicare Other | Admitting: Hematology

## 2015-09-20 ENCOUNTER — Encounter: Payer: Self-pay | Admitting: Hematology

## 2015-09-20 ENCOUNTER — Other Ambulatory Visit (HOSPITAL_BASED_OUTPATIENT_CLINIC_OR_DEPARTMENT_OTHER): Payer: Medicare Other

## 2015-09-20 VITALS — BP 125/70 | HR 58 | Temp 98.4°F | Resp 18 | Ht 61.0 in | Wt 155.7 lb

## 2015-09-20 DIAGNOSIS — K219 Gastro-esophageal reflux disease without esophagitis: Secondary | ICD-10-CM

## 2015-09-20 DIAGNOSIS — M2550 Pain in unspecified joint: Secondary | ICD-10-CM

## 2015-09-20 DIAGNOSIS — M810 Age-related osteoporosis without current pathological fracture: Secondary | ICD-10-CM

## 2015-09-20 DIAGNOSIS — M545 Low back pain: Secondary | ICD-10-CM

## 2015-09-20 DIAGNOSIS — C50911 Malignant neoplasm of unspecified site of right female breast: Secondary | ICD-10-CM | POA: Diagnosis present

## 2015-09-20 DIAGNOSIS — Z853 Personal history of malignant neoplasm of breast: Secondary | ICD-10-CM | POA: Diagnosis not present

## 2015-09-20 LAB — COMPREHENSIVE METABOLIC PANEL (CC13)
ALBUMIN: 3.9 g/dL (ref 3.5–5.0)
ALT: 47 U/L (ref 0–55)
AST: 29 U/L (ref 5–34)
Alkaline Phosphatase: 42 U/L (ref 40–150)
Anion Gap: 8 mEq/L (ref 3–11)
BUN: 21.3 mg/dL (ref 7.0–26.0)
CO2: 26 mEq/L (ref 22–29)
Calcium: 10 mg/dL (ref 8.4–10.4)
Chloride: 108 mEq/L (ref 98–109)
Creatinine: 1 mg/dL (ref 0.6–1.1)
EGFR: 56 mL/min/{1.73_m2} — AB (ref >90)
GLUCOSE: 83 mg/dL (ref 70–140)
Potassium: 4.3 mEq/L (ref 3.5–5.1)
SODIUM: 142 meq/L (ref 136–145)
TOTAL PROTEIN: 7.2 g/dL (ref 6.4–8.3)

## 2015-09-20 LAB — CBC WITH DIFFERENTIAL/PLATELET
BASO%: 0.8 % (ref 0.0–2.0)
BASOS ABS: 0.1 10*3/uL (ref 0.0–0.1)
EOS%: 1.9 % (ref 0.0–7.0)
Eosinophils Absolute: 0.1 10*3/uL (ref 0.0–0.5)
HCT: 40 % (ref 34.8–46.6)
HEMOGLOBIN: 13.3 g/dL (ref 11.6–15.9)
LYMPH#: 2.4 10*3/uL (ref 0.9–3.3)
LYMPH%: 35.4 % (ref 14.0–49.7)
MCH: 28.2 pg (ref 25.1–34.0)
MCHC: 33.2 g/dL (ref 31.5–36.0)
MCV: 85.2 fL (ref 79.5–101.0)
MONO#: 0.5 10*3/uL (ref 0.1–0.9)
MONO%: 7.9 % (ref 0.0–14.0)
NEUT%: 54 % (ref 38.4–76.8)
NEUTROS ABS: 3.7 10*3/uL (ref 1.5–6.5)
Platelets: 236 10*3/uL (ref 145–400)
RBC: 4.7 10*6/uL (ref 3.70–5.45)
RDW: 13.8 % (ref 11.2–14.5)
WBC: 6.9 10*3/uL (ref 3.9–10.3)

## 2015-09-20 MED ORDER — MELOXICAM 7.5 MG PO TABS: 7.5000 mg | ORAL_TABLET | Freq: Every day | ORAL | Status: DC

## 2015-09-20 MED ORDER — DENOSUMAB 60 MG/ML ~~LOC~~ SOLN
60.0000 mg | Freq: Once | SUBCUTANEOUS | Status: AC
  Administered 2015-09-20: 60 mg via SUBCUTANEOUS
  Filled 2015-09-20: qty 1

## 2015-09-20 MED ORDER — TAMOXIFEN CITRATE 20 MG PO TABS: 20.0000 mg | ORAL_TABLET | Freq: Every day | ORAL | Status: DC

## 2015-09-20 NOTE — Patient Instructions (Signed)
Denosumab injection What is this medicine? DENOSUMAB (den oh sue mab) slows bone breakdown. Prolia is used to treat osteoporosis in women after menopause and in men. Xgeva is used to prevent bone fractures and other bone problems caused by cancer bone metastases. Xgeva is also used to treat giant cell tumor of the bone. This medicine may be used for other purposes; ask your health care provider or pharmacist if you have questions. COMMON BRAND NAME(S): Prolia, XGEVA What should I tell my health care provider before I take this medicine? They need to know if you have any of these conditions: -dental disease -eczema -infection or history of infections -kidney disease or on dialysis -low blood calcium or vitamin D -malabsorption syndrome -scheduled to have surgery or tooth extraction -taking medicine that contains denosumab -thyroid or parathyroid disease -an unusual reaction to denosumab, other medicines, foods, dyes, or preservatives -pregnant or trying to get pregnant -breast-feeding How should I use this medicine? This medicine is for injection under the skin. It is given by a health care professional in a hospital or clinic setting. If you are getting Prolia, a special MedGuide will be given to you by the pharmacist with each prescription and refill. Be sure to read this information carefully each time. For Prolia, talk to your pediatrician regarding the use of this medicine in children. Special care may be needed. For Xgeva, talk to your pediatrician regarding the use of this medicine in children. While this drug may be prescribed for children as young as 13 years for selected conditions, precautions do apply. Overdosage: If you think you've taken too much of this medicine contact a poison control center or emergency room at once. Overdosage: If you think you have taken too much of this medicine contact a poison control center or emergency room at once. NOTE: This medicine is only for  you. Do not share this medicine with others. What if I miss a dose? It is important not to miss your dose. Call your doctor or health care professional if you are unable to keep an appointment. What may interact with this medicine? Do not take this medicine with any of the following medications: -other medicines containing denosumab This medicine may also interact with the following medications: -medicines that suppress the immune system -medicines that treat cancer -steroid medicines like prednisone or cortisone This list may not describe all possible interactions. Give your health care provider a list of all the medicines, herbs, non-prescription drugs, or dietary supplements you use. Also tell them if you smoke, drink alcohol, or use illegal drugs. Some items may interact with your medicine. What should I watch for while using this medicine? Visit your doctor or health care professional for regular checks on your progress. Your doctor or health care professional may order blood tests and other tests to see how you are doing. Call your doctor or health care professional if you get a cold or other infection while receiving this medicine. Do not treat yourself. This medicine may decrease your body's ability to fight infection. You should make sure you get enough calcium and vitamin D while you are taking this medicine, unless your doctor tells you not to. Discuss the foods you eat and the vitamins you take with your health care professional. See your dentist regularly. Brush and floss your teeth as directed. Before you have any dental work done, tell your dentist you are receiving this medicine. Do not become pregnant while taking this medicine or for 5 months after stopping   it. Women should inform their doctor if they wish to become pregnant or think they might be pregnant. There is a potential for serious side effects to an unborn child. Talk to your health care professional or pharmacist for more  information. What side effects may I notice from receiving this medicine? Side effects that you should report to your doctor or health care professional as soon as possible: -allergic reactions like skin rash, itching or hives, swelling of the face, lips, or tongue -breathing problems -chest pain -fast, irregular heartbeat -feeling faint or lightheaded, falls -fever, chills, or any other sign of infection -muscle spasms, tightening, or twitches -numbness or tingling -skin blisters or bumps, or is dry, peels, or red -slow healing or unexplained pain in the mouth or jaw -unusual bleeding or bruising Side effects that usually do not require medical attention (Report these to your doctor or health care professional if they continue or are bothersome.): -muscle pain -stomach upset, gas This list may not describe all possible side effects. Call your doctor for medical advice about side effects. You may report side effects to FDA at 1-800-FDA-1088. Where should I keep my medicine? This medicine is only given in a clinic, doctor's office, or other health care setting and will not be stored at home. NOTE: This sheet is a summary. It may not cover all possible information. If you have questions about this medicine, talk to your doctor, pharmacist, or health care provider.  2015, [employer]. (2012-04-06 12:37:47)  

## 2015-09-20 NOTE — Telephone Encounter (Signed)
Gave patient avs report and appointments for May 2017.  °

## 2015-09-20 NOTE — Progress Notes (Signed)
[facility] CLINICAL FOLLOW UP NOTE  ID: Kendra Schultz OB: April 09, 1946  MR#: [id number]  LJQ#:[id number]  PCP: Imelda Pillow, NP GYN:   SU: Dr. Brantley Stage OTHER MD:  CHIEF COMPLAINT: Patient with h/o right sided invasive ductal carcinoma here for follow up.    BREAST CANCER HISTORY:   Cancer of right breast, stage 1 ([place])   1995 Cancer Diagnosis 1995 patient had a mastectomy on the left side that showed a DCIS she was not given adjuvant hormonal therapy. This was then off load [place]. In 1997 patient had a lumpectomy of the right side but she did not receive any adjuvant radiation or chemothera   08/27/2011 Initial Biopsy right breast core biopsy, showed IDA and DCIS    09/07/2011 Receptors her2 ER 89%+, PR 9%+, HER2-, Ki67 9%    09/22/2011 Surgery Right mastectomy, path revealed DCIS, no residual invasive tumor (previous biopsy IDA 0.6cm, grade II), negative margines, one node negative   09/23/2011 Miscellaneous Genetic test: BRCA1(-), BRCA2 (-)    09/25/2011 Oncotype testing RS 10, predicts 10-year recurrence risk of 7% with Tamoxifen alone. low risk.    10/17/2011 -  Anti-estrogen oral therapy Arimidex $RemoveBeforeDE'1mg'[id number]$  dialy, switched to Aromasin in 02/2015 due to arthritis, and switched to Tamoxifen in 06/2015      CURRENT THERAPY:   Tamoxifen 20 mg once daily  INTERVAL HISTORY: Patient is here today for follow-up of her right breast cancer.    I switched her from Aromasin to tamoxifen about 3 months ago. She took one week break after she stopped Aromasin before that. Her joint pain did improve some, she still has mild to moderate joint discomfort, especially the left hand. She also notices low back and mid back pain lately, she has been taking ibuprofen which did not help enough. She otherwise doing well, tolerating tamoxifen better, mild hot flashes, manageable, no other new complaints. She had good appetite and energy level, not very active due to the joint pain weight is stable.     REVIEW OF SYSTEMS:A 10 point review of systems was conducted and is otherwise negative except for what is noted above.     PAST MEDICAL HISTORY: Past Medical History  Diagnosis Date  . GERD (gastroesophageal reflux disease)   . Arthritis   . Anemia   . Hypertension   . Cancer [facility]) 1995, 2012    breast bilateral   . Breast cancer ([place]) 09/06/2013    PAST SURGICAL HISTORY: Past Surgical History  Procedure Laterality Date  . Mastectomy  1995    left  . Breast lumpectomy  1997    right  . Appendectomy    . Ovarian cyst removal    . Tonsillectomy    . Mastectomy w/ sentinel node biopsy  09/17/2011    Procedure: MASTECTOMY WITH SENTINEL LYMPH NODE BIOPSY;  Surgeon: Joyice Faster. Cornett, MD;  Location: [facility] OR;  Service: General;  Laterality: Right;  nuclear medicine injection 30 minutes prior for surgery     FAMILY HISTORY Family History  Problem Relation Age of Onset  . Heart disease Father   . Cancer Father     lymphoma  . Heart disease Brother     GYNECOLOGIC HISTORY: menarche 76, G2 P2, was on birth control previously, without any problems, no h/o sexually transmitted infections  SOCIAL HISTORY: Lives with mother, sister, and 2 sons ages 54 and 78.  Patient is retired, she enjoys puzzles and shopping with her free time.     ADVANCED  DIRECTIVES: not in place   HEALTH MAINTENANCE: Social History  Substance Use Topics  . Smoking status: Former Smoker -- 1.00 packs/day    Types: Cigarettes    Quit date: 11/22/2011  . Smokeless tobacco: None  . Alcohol Use: No      Mammogram: 08/2011, s/p bilateral mastectomy Colonoscopy: no, refuses to have one Bone Density Scan: 03/28/2014 Pap Smear: last in about 2009 Eye Exam: 03/10/14 Vitamin D Level:  06/2012 Lipid Panel: 2015   Allergies  Allergen Reactions  . Cephalexin Nausea And Vomiting    Current Outpatient Prescriptions  Medication Sig Dispense Refill  . albuterol (PROVENTIL HFA;VENTOLIN HFA) 108 (90 BASE)  MCG/ACT inhaler Inhale 2 puffs into the lungs every 6 (six) hours as needed for wheezing.    . benazepril-hydrochlorthiazide (LOTENSIN HCT) 20-25 MG per tablet Take 1 tablet by mouth daily.    Marland Kitchen CALCIUM PO Take 1,200 mg by mouth daily.    . Cholecalciferol (VITAMIN D PO) Take 2,000 Units by mouth daily.     . Cyclobenzaprine HCl (FLEXERIL PO) Take 1 tablet by mouth daily as needed.    Marland Kitchen esomeprazole (NEXIUM) 20 MG capsule Take 1 capsule (20 mg total) by mouth daily at 12 noon. 30 capsule 2  . famotidine (PEPCID) 10 MG tablet Take 10 mg by mouth 2 (two) times daily as needed.     Marland Kitchen ibuprofen (ADVIL,MOTRIN) 200 MG tablet Take 400-600 mg by mouth every 6 (six) hours as needed. For pain     . naproxen (NAPROSYN) 500 MG tablet Take 500 mg by mouth 2 (two) times daily. for 10 days  0  . tamoxifen (NOLVADEX) 20 MG tablet Take 1 tablet (20 mg total) by mouth daily. 90 tablet 3  . beclomethasone (QVAR) 40 MCG/ACT inhaler Inhale 2 puffs into the lungs as needed.    . meloxicam (MOBIC) 7.5 MG tablet Take 1 tablet (7.5 mg total) by mouth daily. 20 tablet 0   No current facility-administered medications for this visit.    OBJECTIVE: Filed Vitals:   09/20/15 1219  BP: 125/70  Pulse: 58  Temp: 98.4 F (36.9 C)  Resp: 18     Body mass index is 29.43 kg/(m^2).     GENERAL: Patient is a well appearing female in no acute distress HEENT:  Sclerae anicteric.  Oropharynx clear and moist. No ulcerations or evidence of oropharyngeal candidiasis. Neck is supple.  NODES:  No cervical, supraclavicular, or axillary lymphadenopathy palpated.  BREAST EXAM: s/p bilateral mastectomies, no nodularity, no skin change, no sign of recurrence.  Benign bilateral breast exam. LUNGS:  Clear to auscultation bilaterally.  No wheezes or rhonchi. HEART:  Regular rate and rhythm. No murmur appreciated. ABDOMEN:  Soft, nontender.  Positive, normoactive bowel sounds. No organomegaly palpated. MSK:  No focal spinal tenderness to  palpation. Full range of motion bilaterally in the upper extremities. EXTREMITIES:  No peripheral edema.   SKIN:  Clear with no obvious rashes or skin changes. No nail dyscrasia. NEURO:  Nonfocal. Well oriented.  Appropriate affect. ECOG FS:1 - Symptomatic but completely ambulatory  LAB RESULTS: CBC Latest Ref Rng 09/20/2015 06/20/2015 03/13/2015  WBC 3.9 - 10.3 10e3/uL 6.9 5.5 6.8  Hemoglobin 11.6 - 15.9 g/dL 13.3 13.2 13.6  Hematocrit 34.8 - 46.6 % 40.0 39.1 41.1  Platelets 145 - 400 10e3/uL 236 253 282    CMP Latest Ref Rng 09/20/2015 06/20/2015 03/13/2015  Glucose 70 - 140 mg/dl 83 98 103  BUN 7.0 - 26.0 mg/dL 21.3 20.1  22.6  Creatinine 0.6 - 1.1 mg/dL 1.0 1.0 1.0  Sodium 136 - 145 mEq/L 142 142 140  Potassium 3.5 - 5.1 mEq/L 4.3 4.2 4.2  Chloride 98 - 107 mEq/L - - -  CO2 22 - 29 mEq/L _0 Calcium 8.4 - 10.4 mg/dL 10.0 9.5 10.1  Total Protein 6.4 - 8.3 g/dL 7.2 7.3 7.5  Total Bilirubin 0.20 - 1.20 mg/dL <0.30 0.61 0.39  Alkaline Phos 40 - 150 U/L 42 56 62  AST 5 - 34 U/L 29 31 53(H)  ALT 0 - 55 U/L 47 54 89(H)     STUDIES: Bone density scan on 03/28/2014 - osteoporosis,T score -3.5   ASSESSMENT: 69 y.o. [place], [place] woman   1. 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 S/p bilateral mastectomy -She is clinically doing well overall - today's physical exam revealed no evidence of recurrence -Given her significant arthralgia, I have switched her from   Aromasin to tamoxifen. She is tolerating tamoxifen better, and according to continue. We plan to complete a 5 year adjuvant hormonal therapy at the end of next year.  -We reviewed the  Potential side effects from  Tamoxifen, especially the small risk of thrombosis and endometrial cancer. It however it does increase her bone density which will be beneficial for her. -She will continue  surveillance with physical exam. -I encouraged her to have healthy diet and regular exercise. She is not very active due to the arthritis and back pain.  2. Osteoporosis:  -Patient was previously on Fosamax but had difficulty tolerating it.   -She will  continue Prolia every 6 months, due  today. She is tolerating well. -Repeat bone density scan every 2 years, next due in June 2017.  3. GERD -Improved, she takes Nexium as needed  4. Arthralgia and back pain -She states her back pain does not respond well to ibuprofen. -I give her a prescription of meloxicam today. -She is going to see her primary care physician next months.  Plan - Continue tamoxifen  -I give her meloxicam prescription today -Return to clinic in 6 months for follow-up, with lab and Prolia injection  I spent 25 minutes counseling the patient face to face.  The total time spent in the appointment was 30 minutes.   Truitt Merle   09/20/2015 3:39 PM

## 2015-11-06 ENCOUNTER — Other Ambulatory Visit: Payer: Self-pay | Admitting: Oncology

## 2016-03-20 ENCOUNTER — Ambulatory Visit (HOSPITAL_BASED_OUTPATIENT_CLINIC_OR_DEPARTMENT_OTHER): Payer: Medicare Other

## 2016-03-20 ENCOUNTER — Encounter: Payer: Self-pay | Admitting: Hematology

## 2016-03-20 ENCOUNTER — Other Ambulatory Visit (HOSPITAL_BASED_OUTPATIENT_CLINIC_OR_DEPARTMENT_OTHER): Payer: Medicare Other

## 2016-03-20 ENCOUNTER — Telehealth: Payer: Self-pay | Admitting: Hematology

## 2016-03-20 ENCOUNTER — Ambulatory Visit (HOSPITAL_BASED_OUTPATIENT_CLINIC_OR_DEPARTMENT_OTHER): Payer: Medicare Other | Admitting: Hematology

## 2016-03-20 VITALS — BP 129/73 | HR 64 | Temp 97.6°F | Resp 18 | Ht 61.0 in | Wt 156.1 lb

## 2016-03-20 DIAGNOSIS — R740 Nonspecific elevation of levels of transaminase and lactic acid dehydrogenase [LDH]: Secondary | ICD-10-CM

## 2016-03-20 DIAGNOSIS — M2550 Pain in unspecified joint: Secondary | ICD-10-CM | POA: Diagnosis not present

## 2016-03-20 DIAGNOSIS — K219 Gastro-esophageal reflux disease without esophagitis: Secondary | ICD-10-CM

## 2016-03-20 DIAGNOSIS — M810 Age-related osteoporosis without current pathological fracture: Secondary | ICD-10-CM

## 2016-03-20 DIAGNOSIS — M545 Low back pain: Secondary | ICD-10-CM

## 2016-03-20 DIAGNOSIS — C50911 Malignant neoplasm of unspecified site of right female breast: Secondary | ICD-10-CM

## 2016-03-20 DIAGNOSIS — Z853 Personal history of malignant neoplasm of breast: Secondary | ICD-10-CM

## 2016-03-20 LAB — COMPREHENSIVE METABOLIC PANEL
ALBUMIN: 3.7 g/dL (ref 3.5–5.0)
ALK PHOS: 42 U/L (ref 40–150)
ALT: 101 U/L — AB (ref 0–55)
AST: 69 U/L — ABNORMAL HIGH (ref 5–34)
Anion Gap: 10 mEq/L (ref 3–11)
BILIRUBIN TOTAL: 0.3 mg/dL (ref 0.20–1.20)
BUN: 18.3 mg/dL (ref 7.0–26.0)
CO2: 26 meq/L (ref 22–29)
Calcium: 9.5 mg/dL (ref 8.4–10.4)
Chloride: 105 mEq/L (ref 98–109)
Creatinine: 1 mg/dL (ref 0.6–1.1)
EGFR: 59 mL/min/{1.73_m2} — ABNORMAL LOW (ref >90)
GLUCOSE: 116 mg/dL (ref 70–140)
POTASSIUM: 3.6 meq/L (ref 3.5–5.1)
Sodium: 141 mEq/L (ref 136–145)
Total Protein: 7 g/dL (ref 6.4–8.3)

## 2016-03-20 LAB — CBC WITH DIFFERENTIAL/PLATELET
BASO%: 0.7 % (ref 0.0–2.0)
Basophils Absolute: 0 10*3/uL (ref 0.0–0.1)
EOS%: 3.3 % (ref 0.0–7.0)
Eosinophils Absolute: 0.2 10*3/uL (ref 0.0–0.5)
HEMATOCRIT: 40.1 % (ref 34.8–46.6)
HEMOGLOBIN: 13.3 g/dL (ref 11.6–15.9)
LYMPH%: 34.6 % (ref 14.0–49.7)
MCH: 28.6 pg (ref 25.1–34.0)
MCHC: 33.2 g/dL (ref 31.5–36.0)
MCV: 86.1 fL (ref 79.5–101.0)
MONO#: 0.6 10*3/uL (ref 0.1–0.9)
MONO%: 10.1 % (ref 0.0–14.0)
NEUT#: 3.2 10*3/uL (ref 1.5–6.5)
NEUT%: 51.3 % (ref 38.4–76.8)
Platelets: 246 10*3/uL (ref 145–400)
RBC: 4.66 10*6/uL (ref 3.70–5.45)
RDW: 13.8 % (ref 11.2–14.5)
WBC: 6.3 10*3/uL (ref 3.9–10.3)
lymph#: 2.2 10*3/uL (ref 0.9–3.3)

## 2016-03-20 MED ORDER — DENOSUMAB 60 MG/ML ~~LOC~~ SOLN
60.0000 mg | Freq: Once | SUBCUTANEOUS | Status: AC
  Administered 2016-03-20: 60 mg via SUBCUTANEOUS
  Filled 2016-03-20: qty 1

## 2016-03-20 NOTE — Telephone Encounter (Signed)
Gave and printed appt sched and avs for pt for June July and Sept

## 2016-03-20 NOTE — Patient Instructions (Signed)
Denosumab injection What is this medicine? DENOSUMAB (den oh sue mab) slows bone breakdown. Prolia is used to treat osteoporosis in women after menopause and in men. Xgeva is used to prevent bone fractures and other bone problems caused by cancer bone metastases. Xgeva is also used to treat giant cell tumor of the bone. This medicine may be used for other purposes; ask your health care provider or pharmacist if you have questions. COMMON BRAND NAME(S): Prolia, XGEVA What should I tell my health care provider before I take this medicine? They need to know if you have any of these conditions: -dental disease -eczema -infection or history of infections -kidney disease or on dialysis -low blood calcium or vitamin D -malabsorption syndrome -scheduled to have surgery or tooth extraction -taking medicine that contains denosumab -thyroid or parathyroid disease -an unusual reaction to denosumab, other medicines, foods, dyes, or preservatives -pregnant or trying to get pregnant -breast-feeding How should I use this medicine? This medicine is for injection under the skin. It is given by a health care professional in a hospital or clinic setting. If you are getting Prolia, a special MedGuide will be given to you by the pharmacist with each prescription and refill. Be sure to read this information carefully each time. For Prolia, talk to your pediatrician regarding the use of this medicine in children. Special care may be needed. For Xgeva, talk to your pediatrician regarding the use of this medicine in children. While this drug may be prescribed for children as young as 13 years for selected conditions, precautions do apply. Overdosage: If you think you've taken too much of this medicine contact a poison control center or emergency room at once. Overdosage: If you think you have taken too much of this medicine contact a poison control center or emergency room at once. NOTE: This medicine is only for  you. Do not share this medicine with others. What if I miss a dose? It is important not to miss your dose. Call your doctor or health care professional if you are unable to keep an appointment. What may interact with this medicine? Do not take this medicine with any of the following medications: -other medicines containing denosumab This medicine may also interact with the following medications: -medicines that suppress the immune system -medicines that treat cancer -steroid medicines like prednisone or cortisone This list may not describe all possible interactions. Give your health care provider a list of all the medicines, herbs, non-prescription drugs, or dietary supplements you use. Also tell them if you smoke, drink alcohol, or use illegal drugs. Some items may interact with your medicine. What should I watch for while using this medicine? Visit your doctor or health care professional for regular checks on your progress. Your doctor or health care professional may order blood tests and other tests to see how you are doing. Call your doctor or health care professional if you get a cold or other infection while receiving this medicine. Do not treat yourself. This medicine may decrease your body's ability to fight infection. You should make sure you get enough calcium and vitamin D while you are taking this medicine, unless your doctor tells you not to. Discuss the foods you eat and the vitamins you take with your health care professional. See your dentist regularly. Brush and floss your teeth as directed. Before you have any dental work done, tell your dentist you are receiving this medicine. Do not become pregnant while taking this medicine or for 5 months after stopping   it. Women should inform their doctor if they wish to become pregnant or think they might be pregnant. There is a potential for serious side effects to an unborn child. Talk to your health care professional or pharmacist for more  information. What side effects may I notice from receiving this medicine? Side effects that you should report to your doctor or health care professional as soon as possible: -allergic reactions like skin rash, itching or hives, swelling of the face, lips, or tongue -breathing problems -chest pain -fast, irregular heartbeat -feeling faint or lightheaded, falls -fever, chills, or any other sign of infection -muscle spasms, tightening, or twitches -numbness or tingling -skin blisters or bumps, or is dry, peels, or red -slow healing or unexplained pain in the mouth or jaw -unusual bleeding or bruising Side effects that usually do not require medical attention (Report these to your doctor or health care professional if they continue or are bothersome.): -muscle pain -stomach upset, gas This list may not describe all possible side effects. Call your doctor for medical advice about side effects. You may report side effects to FDA at 1-800-FDA-1088. Where should I keep my medicine? This medicine is only given in a clinic, doctor's office, or other health care setting and will not be stored at home. NOTE: This sheet is a summary. It may not cover all possible information. If you have questions about this medicine, talk to your doctor, pharmacist, or health care provider.  2015, [employer]. (2012-04-06 12:37:47)  

## 2016-03-20 NOTE — Progress Notes (Signed)
[facility] CLINICAL FOLLOW UP NOTE  ID: Ilhan Debenedetto OB: 08-31-1946  MR#: [id number]  QPY#:[id number]  PCP: Imelda Pillow, NP GYN:   SU: Dr. Brantley Stage OTHER MD:  CHIEF COMPLAINT: Patient with h/o right sided invasive ductal carcinoma here for follow up.    BREAST CANCER HISTORY:   Cancer of right breast, stage 1 ([place])   1995 Cancer Diagnosis 1995 patient had a mastectomy on the left side that showed a DCIS she was not given adjuvant hormonal therapy. This was then off load [place]. In 1997 patient had a lumpectomy of the right side but she did not receive any adjuvant radiation or chemothera   08/27/2011 Initial Biopsy right breast core biopsy, showed IDA and DCIS    09/07/2011 Receptors her2 ER 89%+, PR 9%+, HER2-, Ki67 9%    09/22/2011 Surgery Right mastectomy, path revealed DCIS, no residual invasive tumor (previous biopsy IDA 0.6cm, grade II), negative margines, one node negative   09/23/2011 Miscellaneous Genetic test: BRCA1(-), BRCA2 (-)    09/25/2011 Oncotype testing RS 10, predicts 10-year recurrence risk of 7% with Tamoxifen alone. low risk.    10/17/2011 -  Anti-estrogen oral therapy Arimidex '1mg'$  dialy, switched to Aromasin in 02/2015 due to arthritis, and switched to Tamoxifen in 06/2015      CURRENT THERAPY:   Tamoxifen 20 mg once daily  INTERVAL HISTORY: Patient is here today for follow-up of her right breast cancer. She is doing well overall. She is compliant with tamoxifen. She still has mild joint discomfort especially in her elbows and low back, tolerable. She takes Advil once a while. No other new complaints.  REVIEW OF SYSTEMS:A 10 point review of systems was conducted and is otherwise negative except for what is noted above.     PAST MEDICAL HISTORY: Past Medical History  Diagnosis Date  . GERD (gastroesophageal reflux disease)   . Arthritis   . Anemia   . Hypertension   . Cancer [facility]) 1995, 2012    breast bilateral   . Breast cancer ([place])  09/06/2013    PAST SURGICAL HISTORY: Past Surgical History  Procedure Laterality Date  . Mastectomy  1995    left  . Breast lumpectomy  1997    right  . Appendectomy    . Ovarian cyst removal    . Tonsillectomy    . Mastectomy w/ sentinel node biopsy  09/17/2011    Procedure: MASTECTOMY WITH SENTINEL LYMPH NODE BIOPSY;  Surgeon: Joyice Faster. Cornett, MD;  Location: [facility] OR;  Service: General;  Laterality: Right;  nuclear medicine injection 30 minutes prior for surgery     FAMILY HISTORY Family History  Problem Relation Age of Onset  . Heart disease Father   . Cancer Father     lymphoma  . Heart disease Brother     GYNECOLOGIC HISTORY: menarche 46, G2 P2, was on birth control previously, without any problems, no h/o sexually transmitted infections  SOCIAL HISTORY: Lives with mother, sister, and 2 sons ages 55 and 17.  Patient is retired, she enjoys puzzles and shopping with her free time.     ADVANCED DIRECTIVES: not in place   HEALTH MAINTENANCE: Social History  Substance Use Topics  . Smoking status: Former Smoker -- 1.00 packs/day    Types: Cigarettes    Quit date: 11/22/2011  . Smokeless tobacco: None  . Alcohol Use: No      Mammogram: 08/2011, s/p bilateral mastectomy Colonoscopy: no, refuses to have one Bone Density Scan: 03/28/2014  Pap Smear: last in about 2009 Eye Exam: 03/10/14 Vitamin D Level:  06/2012 Lipid Panel: 2015   Allergies  Allergen Reactions  . Cephalexin Nausea And Vomiting    Current Outpatient Prescriptions  Medication Sig Dispense Refill  . benazepril-hydrochlorthiazide (LOTENSIN HCT) 20-25 MG per tablet Take 1 tablet by mouth daily.    Marland Kitchen CALCIUM PO Take 1,200 mg by mouth daily.    . Cholecalciferol (VITAMIN D PO) Take 2,000 Units by mouth daily.     . Cyclobenzaprine HCl (FLEXERIL PO) Take 1 tablet by mouth daily as needed.    Marland Kitchen ibuprofen (ADVIL,MOTRIN) 200 MG tablet Take 400-600 mg by mouth every 6 (six) hours as needed. For pain     .  tamoxifen (NOLVADEX) 20 MG tablet Take 1 tablet (20 mg total) by mouth daily. 90 tablet 3  . albuterol (PROVENTIL HFA;VENTOLIN HFA) 108 (90 BASE) MCG/ACT inhaler Inhale 2 puffs into the lungs every 6 (six) hours as needed for wheezing. Reported on 03/20/2016    . beclomethasone (QVAR) 40 MCG/ACT inhaler Inhale 2 puffs into the lungs as needed. Reported on 03/20/2016    . famotidine (PEPCID) 10 MG tablet Take 10 mg by mouth 2 (two) times daily as needed.      No current facility-administered medications for this visit.    OBJECTIVE: Filed Vitals:   03/20/16 1134  BP: 129/73  Pulse: 64  Temp: 97.6 F (36.4 C)  Resp: 18     Body mass index is 29.51 kg/(m^2).     GENERAL: Patient is a well appearing female in no acute distress HEENT:  Sclerae anicteric.  Oropharynx clear and moist. No ulcerations or evidence of oropharyngeal candidiasis. Neck is supple.  NODES:  No cervical, supraclavicular, or axillary lymphadenopathy palpated.  BREAST EXAM: s/p bilateral mastectomies, no nodularity, no skin change, no sign of recurrence.   LUNGS:  Clear to auscultation bilaterally.  No wheezes or rhonchi. HEART:  Regular rate and rhythm. No murmur appreciated. ABDOMEN:  Soft, nontender.  Positive, normoactive bowel sounds. No organomegaly palpated. MSK:  No focal spinal tenderness to palpation. Full range of motion bilaterally in the upper extremities. EXTREMITIES:  No peripheral edema.   SKIN:  Clear with no obvious rashes or skin changes. No nail dyscrasia. NEURO:  Nonfocal. Well oriented.  Appropriate affect. ECOG FS:1 - Symptomatic but completely ambulatory  LAB RESULTS: CBC Latest Ref Rng 03/20/2016 09/20/2015 06/20/2015  WBC 3.9 - 10.3 10e3/uL 6.3 6.9 5.5  Hemoglobin 11.6 - 15.9 g/dL 13.3 13.3 13.2  Hematocrit 34.8 - 46.6 % 40.1 40.0 39.1  Platelets 145 - 400 10e3/uL 246 236 253    CMP Latest Ref Rng 03/20/2016 09/20/2015 06/20/2015  Glucose 70 - 140 mg/dl 116 83 98  BUN 7.0 - 26.0 mg/dL 18.3  21.3 20.1  Creatinine 0.6 - 1.1 mg/dL 1.0 1.0 1.0  Sodium 136 - 145 mEq/L 141 142 142  Potassium 3.5 - 5.1 mEq/L 3.6 4.3 4.2  CO2 22 - 29 mEq/L _0 Calcium 8.4 - 10.4 mg/dL 9.5 10.0 9.5  Total Protein 6.4 - 8.3 g/dL 7.0 7.2 7.3  Total Bilirubin 0.20 - 1.20 mg/dL 0.30 <0.30 0.61  Alkaline Phos 40 - 150 U/L 42 42 56  AST 5 - 34 U/L 69(H) 29 31  ALT 0 - 55 U/L 101(H) 47 54     STUDIES: Bone density scan on 03/28/2014 - osteoporosis,T score -3.5   ASSESSMENT: 70 y.o. [place], [place] woman   1. h/o recurrent right breast  T1N0 stage IA invasive ductal carcinoma, grade II, ER 89%, PR 81%, Ki-67 9%, HER-2/neu negative.  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 S/p bilateral mastectomy -She is clinically doing well overall - today's physical exam revealed no evidence of recurrence -Given her significant arthralgia, I have switched her from   Aromasin to tamoxifen. She is tolerating tamoxifen better, will continue. We plan to complete a 5-6 year adjuvant hormonal therapy, until 09/2017   -We reviewed the  Potential side effects from  Tamoxifen, especially the small risk of thrombosis and endometrial cancer. It however it does increase her bone density which will be beneficial for her. -She will continue surveillance with physical exam and routine lab.  -I encouraged her to have healthy diet and regular exercise. She is not very active due to the arthritis and back pain.  2. Osteoporosis:  -Patient was previously on Fosamax but had difficulty tolerating it.   -She will  continue Prolia every 6 months, due  today. She is tolerating well. -Repeat bone density scan every 2 years, next due in June 2017.  3. GERD -Improved, she takes Nexium as needed  4. Arthralgia and back pain -overall stable and tolerable  -She will follow up with her primary care physician next months.  5. transaminitis - She has elevated liver enzymes today, <3UNL, possible related to tamoxifen, we'll  continue observation. She did have mild transaminitis in the past. - Repeat liver function test in 6 weeks, and I'll see her back in 4 months.  Plan - Continue tamoxifen until 09/2017 -Return to clinic in 4 months for follow-up with lab, Prolia injection today and in 6 months   I spent 25 minutes counseling the patient face to face.  The total time spent in the appointment was 30 minutes.   Truitt Merle   03/20/2016 2:26 PM

## 2016-04-04 ENCOUNTER — Ambulatory Visit
Admission: RE | Admit: 2016-04-04 | Discharge: 2016-04-04 | Disposition: A | Discharge status: Home / Self Care | Payer: Medicare Other | Source: Ambulatory Visit | Attending: Hematology | Admitting: Hematology

## 2016-04-04 DIAGNOSIS — M810 Age-related osteoporosis without current pathological fracture: Secondary | ICD-10-CM

## 2016-05-01 ENCOUNTER — Other Ambulatory Visit (HOSPITAL_BASED_OUTPATIENT_CLINIC_OR_DEPARTMENT_OTHER): Payer: Medicare Other

## 2016-05-01 DIAGNOSIS — C50911 Malignant neoplasm of unspecified site of right female breast: Secondary | ICD-10-CM

## 2016-05-01 LAB — CBC WITH DIFFERENTIAL/PLATELET
BASO%: 0.8 % (ref 0.0–2.0)
BASOS ABS: 0 10*3/uL (ref 0.0–0.1)
EOS%: 5.1 % (ref 0.0–7.0)
Eosinophils Absolute: 0.3 10*3/uL (ref 0.0–0.5)
HEMATOCRIT: 38.1 % (ref 34.8–46.6)
HGB: 12.8 g/dL (ref 11.6–15.9)
LYMPH%: 31 % (ref 14.0–49.7)
MCH: 28.7 pg (ref 25.1–34.0)
MCHC: 33.5 g/dL (ref 31.5–36.0)
MCV: 85.7 fL (ref 79.5–101.0)
MONO#: 0.5 10*3/uL (ref 0.1–0.9)
MONO%: 8.5 % (ref 0.0–14.0)
NEUT#: 3.3 10*3/uL (ref 1.5–6.5)
NEUT%: 54.6 % (ref 38.4–76.8)
PLATELETS: 222 10*3/uL (ref 145–400)
RBC: 4.44 10*6/uL (ref 3.70–5.45)
RDW: 13.6 % (ref 11.2–14.5)
WBC: 6 10*3/uL (ref 3.9–10.3)
lymph#: 1.9 10*3/uL (ref 0.9–3.3)

## 2016-05-01 LAB — COMPREHENSIVE METABOLIC PANEL
ALT: 86 U/L — AB (ref 0–55)
AST: 76 U/L — AB (ref 5–34)
Albumin: 3.4 g/dL — ABNORMAL LOW (ref 3.5–5.0)
Alkaline Phosphatase: 41 U/L (ref 40–150)
Anion Gap: 8 mEq/L (ref 3–11)
BUN: 16 mg/dL (ref 7.0–26.0)
CO2: 25 meq/L (ref 22–29)
Calcium: 9.6 mg/dL (ref 8.4–10.4)
Chloride: 108 mEq/L (ref 98–109)
Creatinine: 0.9 mg/dL (ref 0.6–1.1)
EGFR: 67 mL/min/{1.73_m2} — ABNORMAL LOW (ref >90)
GLUCOSE: 116 mg/dL (ref 70–140)
POTASSIUM: 3.7 meq/L (ref 3.5–5.1)
Sodium: 141 mEq/L (ref 136–145)
Total Bilirubin: <0.3 mg/dL (ref 0.20–1.20)
Total Protein: 6.7 g/dL (ref 6.4–8.3)

## 2016-05-02 LAB — HEPATIC FUNCTION PANEL
ALT: 76 IU/L — ABNORMAL HIGH (ref 0–32)
AST: 69 IU/L — AB (ref 0–40)
Albumin, Serum: 3.9 g/dL (ref 3.6–4.8)
Alkaline Phosphatase, S: 39 IU/L (ref 39–117)
BILIRUBIN, DIRECT: 0.04 mg/dL (ref 0.00–0.40)
Total Protein: 6.3 g/dL (ref 6.0–8.5)

## 2016-05-03 ENCOUNTER — Telehealth: Payer: Self-pay | Admitting: *Deleted

## 2016-07-10 ENCOUNTER — Ambulatory Visit (HOSPITAL_BASED_OUTPATIENT_CLINIC_OR_DEPARTMENT_OTHER): Payer: Medicare Other | Admitting: Hematology

## 2016-07-10 ENCOUNTER — Telehealth: Payer: Self-pay | Admitting: Hematology

## 2016-07-10 ENCOUNTER — Other Ambulatory Visit (HOSPITAL_BASED_OUTPATIENT_CLINIC_OR_DEPARTMENT_OTHER): Payer: Medicare Other

## 2016-07-10 ENCOUNTER — Encounter: Payer: Self-pay | Admitting: Hematology

## 2016-07-10 VITALS — BP 117/61 | HR 63 | Temp 97.9°F | Resp 17 | Ht 61.0 in | Wt 154.9 lb

## 2016-07-10 DIAGNOSIS — Z79810 Long term (current) use of selective estrogen receptor modulators (SERMs): Secondary | ICD-10-CM

## 2016-07-10 DIAGNOSIS — M2550 Pain in unspecified joint: Secondary | ICD-10-CM | POA: Diagnosis not present

## 2016-07-10 DIAGNOSIS — M898X8 Other specified disorders of bone, other site: Secondary | ICD-10-CM | POA: Diagnosis not present

## 2016-07-10 DIAGNOSIS — C50911 Malignant neoplasm of unspecified site of right female breast: Secondary | ICD-10-CM | POA: Diagnosis present

## 2016-07-10 DIAGNOSIS — R740 Nonspecific elevation of levels of transaminase and lactic acid dehydrogenase [LDH]: Secondary | ICD-10-CM

## 2016-07-10 DIAGNOSIS — M810 Age-related osteoporosis without current pathological fracture: Secondary | ICD-10-CM

## 2016-07-10 DIAGNOSIS — M549 Dorsalgia, unspecified: Secondary | ICD-10-CM

## 2016-07-10 LAB — CBC WITH DIFFERENTIAL/PLATELET
BASO%: 0.7 % (ref 0.0–2.0)
BASOS ABS: 0 10*3/uL (ref 0.0–0.1)
EOS%: 2.7 % (ref 0.0–7.0)
Eosinophils Absolute: 0.2 10*3/uL (ref 0.0–0.5)
HCT: 39.6 % (ref 34.8–46.6)
HEMOGLOBIN: 13.2 g/dL (ref 11.6–15.9)
LYMPH%: 35.5 % (ref 14.0–49.7)
MCH: 28.8 pg (ref 25.1–34.0)
MCHC: 33.3 g/dL (ref 31.5–36.0)
MCV: 86.5 fL (ref 79.5–101.0)
MONO#: 0.3 10*3/uL (ref 0.1–0.9)
MONO%: 4.8 % (ref 0.0–14.0)
NEUT#: 3.2 10*3/uL (ref 1.5–6.5)
NEUT%: 56.3 % (ref 38.4–76.8)
Platelets: 216 10*3/uL (ref 145–400)
RBC: 4.58 10*6/uL (ref 3.70–5.45)
RDW: 13.6 % (ref 11.2–14.5)
WBC: 5.6 10*3/uL (ref 3.9–10.3)
lymph#: 2 10*3/uL (ref 0.9–3.3)

## 2016-07-10 LAB — COMPREHENSIVE METABOLIC PANEL
ALT: 95 U/L — ABNORMAL HIGH (ref 0–55)
AST: 78 U/L — ABNORMAL HIGH (ref 5–34)
Albumin: 3.4 g/dL — ABNORMAL LOW (ref 3.5–5.0)
Alkaline Phosphatase: 48 U/L (ref 40–150)
Anion Gap: 8 mEq/L (ref 3–11)
BILIRUBIN TOTAL: 0.31 mg/dL (ref 0.20–1.20)
BUN: 19.1 mg/dL (ref 7.0–26.0)
CO2: 26 meq/L (ref 22–29)
Calcium: 9.5 mg/dL (ref 8.4–10.4)
Chloride: 106 mEq/L (ref 98–109)
Creatinine: 1 mg/dL (ref 0.6–1.1)
EGFR: 54 mL/min/{1.73_m2} — AB (ref >90)
Glucose: 182 mg/dl — ABNORMAL HIGH (ref 70–140)
Potassium: 3.9 mEq/L (ref 3.5–5.1)
SODIUM: 140 meq/L (ref 136–145)
Total Protein: 7 g/dL (ref 6.4–8.3)

## 2016-07-10 NOTE — Telephone Encounter (Signed)
GAVE PATIENT AVS REPORT AND APPOINTMENTS FOR November AND January  °

## 2016-07-10 NOTE — Progress Notes (Signed)
[facility] CLINICAL FOLLOW UP NOTE  ID: Kendra Schultz OB: 13-Jan-1946  MR#: [id number]  DGU#:[id number]  PCP: Kendra Pillow, NP GYN:   SU: Dr. Brantley Stage OTHER MD:  CHIEF COMPLAINT: Patient with h/o right sided invasive ductal carcinoma here for follow up.    BREAST CANCER HISTORY:   Cancer of right breast, stage 1 ([place])   1995 Cancer Diagnosis    1995 patient had a mastectomy on the left side that showed a DCIS she was not given adjuvant hormonal therapy. This was then off load [place]. In 1997 patient had a lumpectomy of the right side but she did not receive any adjuvant radiation or chemothera      08/27/2011 Initial Biopsy    right breast core biopsy, showed IDA and DCIS       09/07/2011 Receptors her2    ER 89%+, PR 9%+, HER2-, Ki67 9%       09/22/2011 Surgery    Right mastectomy, path revealed DCIS, no residual invasive tumor (previous biopsy IDA 0.6cm, grade II), negative margines, one node negative      09/23/2011 Miscellaneous    Genetic test: BRCA1(-), BRCA2 (-)       09/25/2011 Oncotype testing    RS 10, predicts 10-year recurrence risk of 7% with Tamoxifen alone. low risk.       10/17/2011 -  Anti-estrogen oral therapy    Arimidex 69m dialy, switched to Aromasin in 02/2015 due to arthritis, and switched to Tamoxifen in 06/2015         CURRENT THERAPY:   Tamoxifen 20 mg once daily  INTERVAL HISTORY: [name] here today for follow-up of her right breast cancer. She is compliant with tamoxifen, but wants to finish early if possible. She has diffuse bone pain, especially in her arms and legs, for which she takes motrin and flexeril. She otherwise is dong well, denies any chest or abdominal discomfort, weight is stable.   REVIEW OF SYSTEMS:A 10 point review of systems was conducted and is otherwise negative except for what is noted above.     PAST MEDICAL HISTORY: Past Medical History:  Diagnosis Date  . Anemia   . Arthritis   . Breast  cancer ([place] 09/06/2013  . Cancer ([place] 1995, 2012   breast bilateral   . GERD (gastroesophageal reflux disease)   . Hypertension     PAST SURGICAL HISTORY: Past Surgical History:  Procedure Laterality Date  . APPENDECTOMY    . BREAST LUMPECTOMY  1997   right  . MASTECTOMY  1995   left  . MASTECTOMY W/ SENTINEL NODE BIOPSY  09/17/2011   Procedure: MASTECTOMY WITH SENTINEL LYMPH NODE BIOPSY;  Surgeon: TJoyice Faster Cornett, MD;  [place]  Service: General;  Laterality: Right;  nuclear medicine injection 30 minutes prior for surgery   . OVARIAN CYST REMOVAL    . TONSILLECTOMY      FAMILY HISTORY Family History  Problem Relation Age of Onset  . Heart disease Father   . Cancer Father     lymphoma  . Heart disease Brother     GYNECOLOGIC HISTORY: menarche 89 G2 P2, was on birth control previously, without any problems, no h/o sexually transmitted infections  SOCIAL HISTORY: Lives with mother, sister, and 2 sons ages 368and 336  Patient is retired, she enjoys puzzles and shopping with her free time.     ADVANCED DIRECTIVES: not in place   HEALTH MAINTENANCE: Social History  Substance Use Topics  .  Smoking status: Former Smoker    Packs/day: 1.00    Types: Cigarettes    Quit date: 11/22/2011  . Smokeless tobacco: Not on file  . Alcohol use No    Mammogram: 08/2011, s/p bilateral mastectomy Colonoscopy: no, refuses to have one Bone Density Scan: 03/28/2014 Pap Smear: last in about 2009 Eye Exam: 03/10/14 Vitamin D Level:  06/2012 Lipid Panel: 2015   Allergies  Allergen Reactions  . Cephalexin Nausea And Vomiting    Current Outpatient Prescriptions  Medication Sig Dispense Refill  . albuterol (PROVENTIL HFA;VENTOLIN HFA) 108 (90 BASE) MCG/ACT inhaler Inhale 2 puffs into the lungs every 6 (six) hours as needed for wheezing. Reported on 03/20/2016    . benazepril-hydrochlorthiazide (LOTENSIN HCT) 20-25 MG per tablet Take 1 tablet by mouth daily.    Marland Kitchen CALCIUM PO  Take 1,200 mg by mouth daily.    . Cholecalciferol (VITAMIN D PO) Take 2,000 Units by mouth daily.     . Cyclobenzaprine HCl (FLEXERIL PO) Take 1 tablet by mouth daily as needed.    . famotidine (PEPCID) 10 MG tablet Take 10 mg by mouth 2 (two) times daily as needed.     Marland Kitchen ibuprofen (ADVIL,MOTRIN) 200 MG tablet Take 400-600 mg by mouth every 6 (six) hours as needed. For pain     . tamoxifen (NOLVADEX) 20 MG tablet Take 1 tablet (20 mg total) by mouth daily. 90 tablet 3   No current facility-administered medications for this visit.     OBJECTIVE: Vitals:   07/10/16 1017  BP: 117/61  Pulse: 63  Resp: 17  Temp: 97.9 F (36.6 C)     Body mass index is 29.27 kg/m.     GENERAL: Patient is a well appearing female in no acute distress HEENT:  Sclerae anicteric.  Oropharynx clear and moist. No ulcerations or evidence of oropharyngeal candidiasis. Neck is supple.  NODES:  No cervical, supraclavicular, or axillary lymphadenopathy palpated.  BREAST EXAM: s/p bilateral mastectomies, no nodularity, no skin change, no sign of recurrence.   LUNGS:  Clear to auscultation bilaterally.  No wheezes or rhonchi. HEART:  Regular rate and rhythm. No murmur appreciated. ABDOMEN:  Soft, nontender.  Positive, normoactive bowel sounds. No organomegaly palpated. MSK:  No focal spinal tenderness to palpation. Full range of motion bilaterally in the upper extremities. EXTREMITIES:  No peripheral edema.   SKIN:  Clear with no obvious rashes or skin changes. No nail dyscrasia. NEURO:  Nonfocal. Well oriented.  Appropriate affect. ECOG FS:1 - Symptomatic but completely ambulatory  LAB RESULTS: CBC Latest Ref Rng & Units 07/10/2016 05/01/2016 03/20/2016  WBC 3.9 - 10.3 10e3/uL 5.6 6.0 6.3  Hemoglobin 11.6 - 15.9 g/dL 13.2 12.8 13.3  Hematocrit 34.8 - 46.6 % 39.6 38.1 40.1  Platelets 145 - 400 10e3/uL 216 222 246    CMP Latest Ref Rng & Units 07/10/2016 05/01/2016 05/01/2016  Glucose 70 - 140 mg/dl 182(H) 116 -   BUN 7.0 - 26.0 mg/dL 19.1 16.0 -  Creatinine 0.6 - 1.1 mg/dL 1.0 0.9 -  Sodium 136 - 145 mEq/L 140 141 -  Potassium 3.5 - 5.1 mEq/L 3.9 3.7 -  Chloride 98 - 107 mEq/L - - -  CO2 22 - 29 mEq/L 26 25 -  Calcium 8.4 - 10.4 mg/dL 9.5 9.6 -  Total Protein 6.4 - 8.3 g/dL 7.0 6.7 6.3  Total Bilirubin 0.20 - 1.20 mg/dL 0.31 <0.2 <0.30  Alkaline Phos 40 - 150 U/L 48 39 41  AST 5 -  34 U/L 78(H) 69(H) 76(H)  ALT 0 - 55 U/L 95(H) 76(H) 86(H)     STUDIES: Bone density scan on 04/04/2016 - ASSESSMENT: The BMD measured at AP Spine L3-L4 is 0.949 g/cm2 with a T-score of -2.1. This patient is considered osteopenic according to [facility]) criteria. L-1, L-2 were excluded due to degenerative changes. There has been a statistically significant increase in BMD of Lumbar spine and no ststistically significant change in left hip since prior exam dated 03/28/2014.   ASSESSMENT: 70 y.o. [place], [place] woman   1. 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 S/p bilateral mastectomy -She is clinically doing well overall - today's physical exam revealed no evidence of recurrence -Given her significant arthralgia, I have switched her from Aromasin to tamoxifen. She is tolerating tamoxifen better, will continue. We plan to complete a 6 year adjuvant hormonal therapy, until 09/2017   -We reviewed the  Potential side effects from  Tamoxifen, especially the small risk of thrombosis and endometrial cancer. It however it does increase her bone density which will be beneficial for her. -She will continue surveillance with physical exam and routine lab.  -I encouraged her to have healthy diet and regular exercise. She is not very active due to the arthritis and back pain.  2. Osteoporosis:  -Patient was previously on Fosamax but had difficulty tolerating it.    -She will  continue Prolia every 6 months. She is tolerating well. -repeated DEXA scan in 04/2016 showed significant improvement in lumbar spine and no change in left hip since 2015   3. GERD -Improved, she takes Nexium as needed  4. Arthralgia and back pain -overall stable and tolerable  -She will follow up with her primary care physician next months.  5. transaminitis - She has had intermittent elevated liver enzymes since 2014, <100, possible related to tamoxifen, her CT in 07/2014 also showed fatty liver  - we'll continue observation.  - Repeat liver function test in 4 months   Plan - Continue tamoxifen until 09/2017 -Return to clinic in 4 months for follow-up with lab, Prolia injection in 2 months   I spent 25 minutes counseling the patient face to face.  The total time spent in the appointment was 30 minutes.   Truitt Merle   07/10/2016 10:22 PM

## 2016-09-19 ENCOUNTER — Ambulatory Visit (HOSPITAL_BASED_OUTPATIENT_CLINIC_OR_DEPARTMENT_OTHER): Payer: Medicare Other

## 2016-09-19 VITALS — BP 113/73 | HR 65 | Temp 97.8°F | Resp 20

## 2016-09-19 DIAGNOSIS — M810 Age-related osteoporosis without current pathological fracture: Secondary | ICD-10-CM | POA: Diagnosis not present

## 2016-09-19 DIAGNOSIS — C50911 Malignant neoplasm of unspecified site of right female breast: Secondary | ICD-10-CM | POA: Diagnosis not present

## 2016-09-19 MED ORDER — DENOSUMAB 60 MG/ML ~~LOC~~ SOLN
60.0000 mg | Freq: Once | SUBCUTANEOUS | Status: AC
  Administered 2016-09-19: 60 mg via SUBCUTANEOUS
  Filled 2016-09-19: qty 1

## 2016-09-19 NOTE — Patient Instructions (Signed)
Denosumab injection What is this medicine? DENOSUMAB (den oh sue mab) slows bone breakdown. Prolia is used to treat osteoporosis in women after menopause and in men. Xgeva is used to prevent bone fractures and other bone problems caused by cancer bone metastases. Xgeva is also used to treat giant cell tumor of the bone. This medicine may be used for other purposes; ask your health care provider or pharmacist if you have questions. COMMON BRAND NAME(S): Prolia, XGEVA What should I tell my health care provider before I take this medicine? They need to know if you have any of these conditions: -dental disease -eczema -infection or history of infections -kidney disease or on dialysis -low blood calcium or vitamin D -malabsorption syndrome -scheduled to have surgery or tooth extraction -taking medicine that contains denosumab -thyroid or parathyroid disease -an unusual reaction to denosumab, other medicines, foods, dyes, or preservatives -pregnant or trying to get pregnant -breast-feeding How should I use this medicine? This medicine is for injection under the skin. It is given by a health care professional in a hospital or clinic setting. If you are getting Prolia, a special MedGuide will be given to you by the pharmacist with each prescription and refill. Be sure to read this information carefully each time. For Prolia, talk to your pediatrician regarding the use of this medicine in children. Special care may be needed. For Xgeva, talk to your pediatrician regarding the use of this medicine in children. While this drug may be prescribed for children as young as 13 years for selected conditions, precautions do apply. Overdosage: If you think you've taken too much of this medicine contact a poison control center or emergency room at once. Overdosage: If you think you have taken too much of this medicine contact a poison control center or emergency room at once. NOTE: This medicine is only for  you. Do not share this medicine with others. What if I miss a dose? It is important not to miss your dose. Call your doctor or health care professional if you are unable to keep an appointment. What may interact with this medicine? Do not take this medicine with any of the following medications: -other medicines containing denosumab This medicine may also interact with the following medications: -medicines that suppress the immune system -medicines that treat cancer -steroid medicines like prednisone or cortisone This list may not describe all possible interactions. Give your health care provider a list of all the medicines, herbs, non-prescription drugs, or dietary supplements you use. Also tell them if you smoke, drink alcohol, or use illegal drugs. Some items may interact with your medicine. What should I watch for while using this medicine? Visit your doctor or health care professional for regular checks on your progress. Your doctor or health care professional may order blood tests and other tests to see how you are doing. Call your doctor or health care professional if you get a cold or other infection while receiving this medicine. Do not treat yourself. This medicine may decrease your body's ability to fight infection. You should make sure you get enough calcium and vitamin D while you are taking this medicine, unless your doctor tells you not to. Discuss the foods you eat and the vitamins you take with your health care professional. See your dentist regularly. Brush and floss your teeth as directed. Before you have any dental work done, tell your dentist you are receiving this medicine. Do not become pregnant while taking this medicine or for 5 months after stopping   it. Women should inform their doctor if they wish to become pregnant or think they might be pregnant. There is a potential for serious side effects to an unborn child. Talk to your health care professional or pharmacist for more  information. What side effects may I notice from receiving this medicine? Side effects that you should report to your doctor or health care professional as soon as possible: -allergic reactions like skin rash, itching or hives, swelling of the face, lips, or tongue -breathing problems -chest pain -fast, irregular heartbeat -feeling faint or lightheaded, falls -fever, chills, or any other sign of infection -muscle spasms, tightening, or twitches -numbness or tingling -skin blisters or bumps, or is dry, peels, or red -slow healing or unexplained pain in the mouth or jaw -unusual bleeding or bruising Side effects that usually do not require medical attention (Report these to your doctor or health care professional if they continue or are bothersome.): -muscle pain -stomach upset, gas This list may not describe all possible side effects. Call your doctor for medical advice about side effects. You may report side effects to FDA at 1-800-FDA-1088. Where should I keep my medicine? This medicine is only given in a clinic, doctor's office, or other health care setting and will not be stored at home. NOTE: This sheet is a summary. It may not cover all possible information. If you have questions about this medicine, talk to your doctor, pharmacist, or health care provider.  2015, [employer]. (2012-04-06 12:37:47)  

## 2016-10-28 ENCOUNTER — Telehealth: Payer: Self-pay | Admitting: Hematology

## 2016-10-28 NOTE — Telephone Encounter (Signed)
left voicemail to advise patient that appointment has changed from 11/06/16 to 11/04/16 at 1:30pm

## 2016-11-04 ENCOUNTER — Other Ambulatory Visit (HOSPITAL_BASED_OUTPATIENT_CLINIC_OR_DEPARTMENT_OTHER): Payer: Medicare Other

## 2016-11-04 ENCOUNTER — Ambulatory Visit (HOSPITAL_BASED_OUTPATIENT_CLINIC_OR_DEPARTMENT_OTHER): Payer: Medicare Other | Admitting: Hematology

## 2016-11-04 ENCOUNTER — Telehealth: Payer: Self-pay | Admitting: Hematology

## 2016-11-04 ENCOUNTER — Encounter: Payer: Self-pay | Admitting: Hematology

## 2016-11-04 VITALS — BP 157/90 | HR 65 | Temp 99.2°F | Resp 16 | Ht 61.0 in | Wt 160.9 lb

## 2016-11-04 DIAGNOSIS — Z170 Estrogen receptor positive status [ER+]: Principal | ICD-10-CM

## 2016-11-04 DIAGNOSIS — D0512 Intraductal carcinoma in situ of left breast: Secondary | ICD-10-CM

## 2016-11-04 DIAGNOSIS — M810 Age-related osteoporosis without current pathological fracture: Secondary | ICD-10-CM

## 2016-11-04 DIAGNOSIS — C50911 Malignant neoplasm of unspecified site of right female breast: Secondary | ICD-10-CM

## 2016-11-04 DIAGNOSIS — M2550 Pain in unspecified joint: Secondary | ICD-10-CM

## 2016-11-04 DIAGNOSIS — K219 Gastro-esophageal reflux disease without esophagitis: Secondary | ICD-10-CM | POA: Diagnosis not present

## 2016-11-04 DIAGNOSIS — M549 Dorsalgia, unspecified: Secondary | ICD-10-CM

## 2016-11-04 DIAGNOSIS — Z79810 Long term (current) use of selective estrogen receptor modulators (SERMs): Secondary | ICD-10-CM | POA: Diagnosis not present

## 2016-11-04 DIAGNOSIS — R740 Nonspecific elevation of levels of transaminase and lactic acid dehydrogenase [LDH]: Secondary | ICD-10-CM | POA: Diagnosis not present

## 2016-11-04 LAB — COMPREHENSIVE METABOLIC PANEL
ALBUMIN: 3.5 g/dL (ref 3.5–5.0)
ALT: 91 U/L — ABNORMAL HIGH (ref 0–55)
AST: 64 U/L — AB (ref 5–34)
Alkaline Phosphatase: 51 U/L (ref 40–150)
Anion Gap: 9 mEq/L (ref 3–11)
BUN: 11.7 mg/dL (ref 7.0–26.0)
CHLORIDE: 108 meq/L (ref 98–109)
CO2: 26 meq/L (ref 22–29)
Calcium: 9.4 mg/dL (ref 8.4–10.4)
Creatinine: 0.8 mg/dL (ref 0.6–1.1)
EGFR: 72 mL/min/{1.73_m2} — AB (ref >90)
GLUCOSE: 76 mg/dL (ref 70–140)
POTASSIUM: 3.9 meq/L (ref 3.5–5.1)
SODIUM: 143 meq/L (ref 136–145)
Total Bilirubin: 0.26 mg/dL (ref 0.20–1.20)
Total Protein: 6.8 g/dL (ref 6.4–8.3)

## 2016-11-04 LAB — CBC WITH DIFFERENTIAL/PLATELET
BASO%: 0.6 % (ref 0.0–2.0)
BASOS ABS: 0 10*3/uL (ref 0.0–0.1)
EOS%: 3.4 % (ref 0.0–7.0)
Eosinophils Absolute: 0.2 10*3/uL (ref 0.0–0.5)
HCT: 38.6 % (ref 34.8–46.6)
HEMOGLOBIN: 13 g/dL (ref 11.6–15.9)
LYMPH%: 35.9 % (ref 14.0–49.7)
MCH: 29.4 pg (ref 25.1–34.0)
MCHC: 33.7 g/dL (ref 31.5–36.0)
MCV: 87.1 fL (ref 79.5–101.0)
MONO#: 0.5 10*3/uL (ref 0.1–0.9)
MONO%: 7.3 % (ref 0.0–14.0)
NEUT#: 3.6 10*3/uL (ref 1.5–6.5)
NEUT%: 52.8 % (ref 38.4–76.8)
Platelets: 224 10*3/uL (ref 145–400)
RBC: 4.43 10*6/uL (ref 3.70–5.45)
RDW: 13.4 % (ref 11.2–14.5)
WBC: 6.7 10*3/uL (ref 3.9–10.3)
lymph#: 2.4 10*3/uL (ref 0.9–3.3)

## 2016-11-04 MED ORDER — TAMOXIFEN CITRATE 20 MG PO TABS: 20.0000 mg | ORAL_TABLET | Freq: Every day | ORAL | 3 refills | Status: DC

## 2016-11-04 NOTE — Telephone Encounter (Signed)
Gave patient avs report and appointments for July.  °

## 2016-11-04 NOTE — Progress Notes (Signed)
[facility] CLINICAL FOLLOW UP NOTE  ID: Kendra Schultz OB: 03/10/1946  MR#: [id number]  NWG#:[id number]  PCP: Imelda Pillow, NP GYN:   SU: Dr. Brantley Stage OTHER MD:  CHIEF COMPLAINT: Patient with h/o right sided invasive ductal carcinoma here for follow up.    BREAST CANCER HISTORY:   Cancer of right breast, stage 1 ([place])   1995 Cancer Diagnosis    1995 patient had a mastectomy on the left side that showed a DCIS she was not given adjuvant hormonal therapy. This was then off load [place]. In 1997 patient had a lumpectomy of the right side but she did not receive any adjuvant radiation or chemothera      08/27/2011 Initial Biopsy    right breast core biopsy, showed IDA and DCIS       09/07/2011 Receptors her2    ER 89%+, PR 9%+, HER2-, Ki67 9%       09/22/2011 Surgery    Right mastectomy, path revealed DCIS, no residual invasive tumor (previous biopsy IDA 0.6cm, grade II), negative margines, one node negative      09/23/2011 Miscellaneous    Genetic test: BRCA1(-), BRCA2 (-)       09/25/2011 Oncotype testing    RS 10, predicts 10-year recurrence risk of 7% with Tamoxifen alone. low risk.       10/17/2011 -  Anti-estrogen oral therapy    Arimidex 11m dialy, switched to Aromasin in 02/2015 due to arthritis, and switched to Tamoxifen in 06/2015         CURRENT THERAPY:   Tamoxifen 20 mg once daily, run out in mid Dec 2017  INTERVAL HISTORY: MMatais here today for follow-up of her right breast cancer. Her mother passed away about months ago, she has been quite stressed due to that. She ran out of her tamoxifen one month ago, and has not call my office to renew it. She wants to know if she can stop it. She otherwise feels well, denies any other new pain or other symptoms. She has no change in her appetite or weight.    REVIEW OF SYSTEMS:A 10 point review of systems was conducted and is otherwise negative except for what is noted above.     PAST MEDICAL  HISTORY: Past Medical History:  Diagnosis Date  . Anemia   . Arthritis   . Breast cancer ([facility] [place]  . [place]   breast bilateral   . GERD (gastroesophageal reflux disease)   . Hypertension     PAST SURGICAL HISTORY: Past Surgical History:  Procedure Laterality Date  . APPENDECTOMY    . BREAST LUMPECTOMY  1997   right  . MASTECTOMY  1995   left  . MASTECTOMY W/ SENTINEL NODE BIOPSY  09/17/2011   Procedure: MASTECTOMY WITH SENTINEL LYMPH NODE BIOPSY;  Surgeon: TJoyice Faster Cornett, MD;  [place] [facility]  Service: General;  Laterality: Right;  nuclear medicine injection 30 minutes prior for surgery   . OVARIAN CYST REMOVAL    . TONSILLECTOMY      FAMILY HISTORY Family History  Problem Relation Age of Onset  . Heart disease Father   . Cancer Father     lymphoma  . Heart disease Brother     GYNECOLOGIC HISTORY: menarche 89 G2 P2, was on birth control previously, without any problems, no h/o sexually transmitted infections  SOCIAL HISTORY: Lives with mother, sister, and 2 sons ages 360and 371  Patient is retired, she enjoys puzzles  and shopping with her free time.     ADVANCED DIRECTIVES: not in place   HEALTH MAINTENANCE: Social History  Substance Use Topics  . Smoking status: Former Smoker    Packs/day: 1.00    Types: Cigarettes    Quit date: 11/22/2011  . Smokeless tobacco: Never Used  . Alcohol use No    Mammogram: 08/2011, s/p bilateral mastectomy Colonoscopy: no, refuses to have one Bone Density Scan: 03/28/2014 Pap Smear: last in about 2009 Eye Exam: 03/10/14 Vitamin D Level:  06/2012 Lipid Panel: 2015   Allergies  Allergen Reactions  . Cephalexin Nausea And Vomiting    Current Outpatient Prescriptions  Medication Sig Dispense Refill  . albuterol (PROVENTIL HFA;VENTOLIN HFA) 108 (90 BASE) MCG/ACT inhaler Inhale 2 puffs into the lungs every 6 (six) hours as needed for wheezing. Reported on 03/20/2016    .  benazepril-hydrochlorthiazide (LOTENSIN HCT) 20-25 MG per tablet Take 1 tablet by mouth daily.    Marland Kitchen CALCIUM PO Take 1,200 mg by mouth daily.    . Cholecalciferol (VITAMIN D PO) Take 2,000 Units by mouth daily.     . Cyclobenzaprine HCl (FLEXERIL PO) Take 1 tablet by mouth daily as needed.    . famotidine (PEPCID) 10 MG tablet Take 10 mg by mouth 2 (two) times daily as needed.     Marland Kitchen ibuprofen (ADVIL,MOTRIN) 200 MG tablet Take 400-600 mg by mouth every 6 (six) hours as needed. For pain     . tamoxifen (NOLVADEX) 20 MG tablet Take 1 tablet (20 mg total) by mouth daily. 90 tablet 3   No current facility-administered medications for this visit.     OBJECTIVE: Vitals:   11/04/16 1400  BP: (!) 157/90  Pulse: 65  Resp: 16  Temp: 99.2 F (37.3 C)     Body mass index is 30.4 kg/m.     GENERAL: Patient is a well appearing female in no acute distress HEENT:  Sclerae anicteric.  Oropharynx clear and moist. No ulcerations or evidence of oropharyngeal candidiasis. Neck is supple.  NODES:  No cervical, supraclavicular, or axillary lymphadenopathy palpated.  BREAST EXAM: s/p bilateral mastectomies, no nodularity, no skin change, no sign of recurrence.   LUNGS:  Clear to auscultation bilaterally.  No wheezes or rhonchi. HEART:  Regular rate and rhythm. No murmur appreciated. ABDOMEN:  Soft, nontender.  Positive, normoactive bowel sounds. No organomegaly palpated. MSK:  No focal spinal tenderness to palpation. Full range of motion bilaterally in the upper extremities. EXTREMITIES:  No peripheral edema.   SKIN:  Clear with no obvious rashes or skin changes. No nail dyscrasia. NEURO:  Nonfocal. Well oriented.  Appropriate affect. ECOG FS:1 - Symptomatic but completely ambulatory  LAB RESULTS: CBC Latest Ref Rng & Units 11/04/2016 07/10/2016 05/01/2016  WBC 3.9 - 10.3 10e3/uL 6.7 5.6 6.0  Hemoglobin 11.6 - 15.9 g/dL 13.0 13.2 12.8  Hematocrit 34.8 - 46.6 % 38.6 39.6 38.1  Platelets 145 - 400 10e3/uL  224 216 222    CMP Latest Ref Rng & Units 11/04/2016 07/10/2016 05/01/2016  Glucose 70 - 140 mg/dl 76 182(H) 116  BUN 7.0 - 26.0 mg/dL 11.7 19.1 16.0  Creatinine 0.6 - 1.1 mg/dL 0.8 1.0 0.9  Sodium 136 - 145 mEq/L 143 140 141  Potassium 3.5 - 5.1 mEq/L 3.9 3.9 3.7  Chloride 98 - 107 mEq/L - - -  CO2 22 - 29 mEq/L _0 Calcium 8.4 - 10.4 mg/dL 9.4 9.5 9.6  Total Protein 6.4 - 8.3 g/dL  6.8 7.0 6.7  Total Bilirubin 0.20 - 1.20 mg/dL 0.26 0.31 <0.2  Alkaline Phos 40 - 150 U/L 51 48 39  AST 5 - 34 U/L 64(H) 78(H) 69(H)  ALT 0 - 55 U/L 91(H) 95(H) 76(H)     STUDIES: Bone density scan on 04/04/2016 - ASSESSMENT: The BMD measured at AP Spine L3-L4 is 0.949 g/cm2 with a T-score of -2.1. This patient is considered osteopenic according to [facility]) criteria. L-1, L-2 were excluded due to degenerative changes. There has been a statistically significant increase in BMD of Lumbar spine and no ststistically significant change in left hip since prior exam dated 03/28/2014.   ASSESSMENT: 71 y.o. [place], [place] woman   1. 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 S/p bilateral mastectomy -She is clinically doing well overall - today's physical exam revealed no evidence of recurrence -Given her significant arthralgia, I have switched her from Aromasin to tamoxifen. She is tolerating tamoxifen well, will continue. We plan to complete a 6 year adjuvant hormonal therapy, until 09/2017  -She ran out of tamoxifen one month ago, and wants to know if she can stop tamoxifen because she has completed 5 years of adjuvant endocrine therapy. I encouraged her to take one more year of tamoxifen since she has been tolerating well, she agrees and will restart. I refilled her tamoxifen today. -She will continue surveillance with physical exam and routine  lab.  -I encouraged her to have healthy diet and regular exercise. She is not very active due to the arthritis and back pain.  2. Osteoporosis:  -Patient was previously on Fosamax but had difficulty tolerating it.   -She will  continue Prolia every 6 months. She is tolerating well. -repeated DEXA scan in 04/2016 showed significant improvement in lumbar spine and no change in left hip since 2015   3. GERD -Improved, she takes Nexium as needed  4. Arthralgia and back pain -overall stable and tolerable  -She will follow up with her primary care physician next months.  5. transaminitis - She has had intermittent elevated liver enzymes since 2014, <100, possible related to tamoxifen, her CT in 07/2014 also showed fatty liver  -She ran out of tamoxifen one month ago, repeated CMP today showed similar level transaminitis, I do not think this is related to her tamoxifen. -I encouraged her to follow-up with her primary care physician  Plan - I refilled her tamoxifen today, she will restart and continue tamoxifen until 09/2017 -Return to clinic in 6 months for follow-up with lab, Prolia injection in May 2018   I spent 20 minutes counseling the patient face to face.  The total time spent in the appointment was 25 minutes.   Truitt Merle   11/04/2016 3:19 PM

## 2016-11-06 ENCOUNTER — Other Ambulatory Visit: Payer: Medicare Other

## 2016-11-06 ENCOUNTER — Ambulatory Visit: Payer: Medicare Other | Admitting: Hematology

## 2016-11-11 ENCOUNTER — Telehealth: Payer: Self-pay | Admitting: Hematology

## 2016-11-11 NOTE — Telephone Encounter (Signed)
Added prolia injection for end of May per 1/15 schedule message. Spoke with patient she is aware.

## 2017-01-13 ENCOUNTER — Encounter: Payer: Self-pay | Admitting: Nurse Practitioner

## 2017-01-24 ENCOUNTER — Ambulatory Visit (INDEPENDENT_AMBULATORY_CARE_PROVIDER_SITE_OTHER): Payer: Medicare Other | Admitting: Nurse Practitioner

## 2017-01-24 ENCOUNTER — Encounter (INDEPENDENT_AMBULATORY_CARE_PROVIDER_SITE_OTHER): Payer: Self-pay

## 2017-01-24 ENCOUNTER — Encounter: Payer: Self-pay | Admitting: Nurse Practitioner

## 2017-01-24 ENCOUNTER — Telehealth: Payer: Self-pay

## 2017-01-24 ENCOUNTER — Other Ambulatory Visit (INDEPENDENT_AMBULATORY_CARE_PROVIDER_SITE_OTHER): Payer: Medicare Other

## 2017-01-24 VITALS — BP 112/80 | HR 68 | Resp 16 | Ht 61.0 in | Wt 153.0 lb

## 2017-01-24 DIAGNOSIS — Z1211 Encounter for screening for malignant neoplasm of colon: Secondary | ICD-10-CM | POA: Diagnosis not present

## 2017-01-24 DIAGNOSIS — E8889 Other specified metabolic disorders: Secondary | ICD-10-CM

## 2017-01-24 DIAGNOSIS — R945 Abnormal results of liver function studies: Secondary | ICD-10-CM

## 2017-01-24 DIAGNOSIS — R7989 Other specified abnormal findings of blood chemistry: Secondary | ICD-10-CM

## 2017-01-24 DIAGNOSIS — R110 Nausea: Secondary | ICD-10-CM | POA: Diagnosis not present

## 2017-01-24 DIAGNOSIS — K219 Gastro-esophageal reflux disease without esophagitis: Secondary | ICD-10-CM

## 2017-01-24 DIAGNOSIS — E7889 Other lipoprotein metabolism disorders: Secondary | ICD-10-CM

## 2017-01-24 LAB — FERRITIN: Ferritin: 226.1 ng/mL (ref 10.0–291.0)

## 2017-01-24 MED ORDER — OMEPRAZOLE 20 MG PO CPDR: 20.0000 mg | DELAYED_RELEASE_CAPSULE | Freq: Every day | ORAL | 3 refills | Status: DC

## 2017-01-24 NOTE — Telephone Encounter (Signed)
Call to [facility] to get lab results.  Results of vit d, tsh, a1c, lipid, cmp, cbc will be faxed from facility.

## 2017-01-24 NOTE — Patient Instructions (Addendum)
You have been scheduled for an endoscopy. Please follow written instructions given to you at your visit today. If you use inhalers (even only as needed), please bring them with you on the day of your procedure. Your physician has requested that you go to www.startemmi.com and enter the access code given to you at your visit today. This web site gives a general overview about your procedure. However, you should still follow specific instructions given to you by our office regarding your preparation for the procedure.  We have sent the following medications to your pharmacy for you to pick up at your convenience: Omeprazole 20mg  take 30 minutes prior to meal daily.  Your physician has requested that you go to the basement for the following lab work before leaving today: Ferritin, ANA, antismooth muscle antibody, Hep B antigen/antibody, Hep C antibody.  Please call and schedule a follow up appointment with Tye Savoy, NP in 2-3 weeks.  We have given you a pamphlet on GERD and Colonoscopy.

## 2017-01-24 NOTE — Progress Notes (Addendum)
HPI: Patient is a 71 year old female referred by PCP Shanon Rosser, P.A.-C for evaluation of GERD and abnormal liver function studies. She has a history of stage 1, right breast cancer followed by Dr. Burr Medico.. No evidence for recurrence, she is on surveillance program. She is status post bilateral mastectomy. On tamoxifen. She has osteoporosis, unable to tolerate Fosamax so on Prolia.  She has had intermittent transaminitis for years in setting of fatty liver disease as well as tamoxifen. No history of heavy alcohol use. No family history of liver disease.  Kendra Schultz has a long history of GERD. She was taking Nexium but recently started taking Dexilant as needed and it does help. She admits to solid food dysphagia. Episodes occur about once a week. She has been having problems with nausea for a month now. The nausea is random without relationship to food. She cannot correlate onset of nausea with any new medications. She takes Advil sometimes. Nausea has led to a 7 pound weight loss since mid January.. No abdominal pain. Her bowels are at baseline. Twin sister recently found to have adenomatous colon polyps. Patient not interested in a colonoscopy or any type of colon cancer screening.    Past Medical History:  Diagnosis Date  . Anemia   . Arthritis   . Breast cancer ([place]) 09/06/2013  . Cancer ([place]) 1995, 2012   breast bilateral   . GERD (gastroesophageal reflux disease)   . Hypertension      Past Surgical History:  Procedure Laterality Date  . APPENDECTOMY    . BREAST LUMPECTOMY  1997   right  . MASTECTOMY  1995   left  . MASTECTOMY W/ SENTINEL NODE BIOPSY  09/17/2011   Procedure: MASTECTOMY WITH SENTINEL LYMPH NODE BIOPSY;  Surgeon: Joyice Faster. Cornett, MD;  Location: [facility];  Service: General;  Laterality: Right;  nuclear medicine injection 30 minutes prior for surgery   . OVARIAN CYST REMOVAL    . TONSILLECTOMY     Family History  Problem Relation Age of Onset  . Heart disease  Father   . Cancer Father     lymphoma  . Heart disease Brother    Social History  Substance Use Topics  . Smoking status: Former Smoker    Packs/day: 1.00    Types: Cigarettes    Quit date: 11/22/2011  . Smokeless tobacco: Never Used  . Alcohol use No   Current Outpatient Prescriptions  Medication Sig Dispense Refill  . albuterol (PROVENTIL HFA;VENTOLIN HFA) 108 (90 BASE) MCG/ACT inhaler Inhale 2 puffs into the lungs every 6 (six) hours as needed for wheezing. Reported on 03/20/2016    . benazepril-hydrochlorthiazide (LOTENSIN HCT) 20-25 MG per tablet Take 1 tablet by mouth daily.    Marland Kitchen CALCIUM PO Take 1,200 mg by mouth daily.    . Cholecalciferol (VITAMIN D PO) Take 2,000 Units by mouth daily.     . Cyclobenzaprine HCl (FLEXERIL PO) Take 1 tablet by mouth daily as needed.    . famotidine (PEPCID) 10 MG tablet Take 10 mg by mouth 2 (two) times daily as needed.     Marland Kitchen ibuprofen (ADVIL,MOTRIN) 200 MG tablet Take 400-600 mg by mouth every 6 (six) hours as needed. For pain     . tamoxifen (NOLVADEX) 20 MG tablet Take 1 tablet (20 mg total) by mouth daily. 90 tablet 3   No current facility-administered medications for this visit.    Allergies  Allergen Reactions  . Cephalexin Nausea And Vomiting  Review of Systems: All systems reviewed and negative except where noted in HPI.    Physical Exam: BP 112/80   Pulse 68   Resp 16   Ht 5\' 1"  (1.549 m)   Wt 153 lb (69.4 kg)   BMI 28.91 kg/m  Constitutional:  Well-developed, white female in no acute distress. Psychiatric: Normal mood and affect. Behavior is normal. EENT:  Conjunctivae are normal. No scleral icterus. Neck supple.  Cardiovascular: Normal rate, regular rhythm.  Pulmonary/chest: Effort normal and breath sounds normal. No wheezing, rales or rhonchi. Abdominal: Soft, nondistended, nontender. Bowel sounds active throughout. There are no masses palpable. No hepatomegaly. Extremities: no edema Lymphadenopathy: No cervical  adenopathy noted. Neurological: Alert and oriented to person place and time. Skin: Skin is warm and dry. No rashes noted.   ASSESSMENT AND PLAN:  58. 71 year old female with chronic mild transaminitis. Hepatic steatosis on CT scan a few years back. -Transaminitis could be medication related or from hepatic steatosis but need to rule out other etiologies (e.g. viral, autoimmune , metabolic).  Obtain hepatitis B and C labs, AMA, ASMA, ferritin. She had triglycerides drawn by PCP recently, results requested. -treatment of steatosis discussed, may need med for triglycerides if markedly elevated.   2. Nausea with involuntarily loss of 5 pounds. Nausea persistent for a month.  -for further evaluation patient will be scheduled for an EGD. The risks and benefits of EGD were discussed and the patient agrees to proceed.    3. Chronic GERD. Intermittent symptoms which have recently become much worse. She has belching, having heartburn. Admits to episodes of dysphagia about once a week. Was on Nexium, recently given samples of Dexilant which helps burning but only taking it as needed.  -I am not sure whether insurance will cover Dexilant but will confirm. If not will put her on a different PPI and advise to take it every day 30 minutes before breakfast. -Reflux measures discussed, GERD literature given. -Other recommendations following EGD.   4. Colon cancer screening. Patient is not interested in a colonoscopy or even non-invasive testing. Her twin sister recently had a colonoscopy, sounds a she had polyps. Patient has no bowel changes, blood in stool or other warning signs but we did discuss the importance of colon cancer screening. I gave her brochure about colonoscopy, we will talk further about this at the time of her follow-up appointment.  Tye Savoy, NP  01/24/2017, 10:10 AM   Cc: Shanon Rosser, P.A. [facility]  Agree with Kendra Schultz's assessment and plan. [name], MD,  [facility]

## 2017-01-25 LAB — HEPATITIS C ANTIBODY: HCV Ab: NEGATIVE

## 2017-01-25 LAB — HEPATITIS B SURFACE ANTIBODY,QUALITATIVE: HEP B S AB: NEGATIVE

## 2017-01-25 LAB — HEPATITIS B SURFACE ANTIGEN: Hepatitis B Surface Ag: NEGATIVE

## 2017-01-27 LAB — ANTI-SMOOTH MUSCLE ANTIBODY, IGG

## 2017-01-27 LAB — ANA: ANA: NEGATIVE

## 2017-01-29 ENCOUNTER — Encounter: Payer: Self-pay | Admitting: Internal Medicine

## 2017-01-29 ENCOUNTER — Ambulatory Visit (AMBULATORY_SURGERY_CENTER): Payer: Medicare Other | Admitting: Internal Medicine

## 2017-01-29 VITALS — BP 159/88 | HR 63 | Temp 99.5°F | Resp 16 | Ht 61.0 in | Wt 153.0 lb

## 2017-01-29 DIAGNOSIS — K219 Gastro-esophageal reflux disease without esophagitis: Secondary | ICD-10-CM

## 2017-01-29 MED ORDER — SODIUM CHLORIDE 0.9 % IV SOLN: 500.0000 mL | INTRAVENOUS | Status: DC

## 2017-01-29 NOTE — Patient Instructions (Addendum)
The esophagus looks like it does not squeeze properly - "esophageal dysmotility" Acid reflux can cause this over time.  No ulcers or cancer seen.  Please take the omeprazole every single day no matter what and come see me in 2 months please. I am thinking that will help you with your symptoms. Also take your time and chew carefully and drink fluids as you eat.  You may call and make an appointment to see [place]. Call soon and make an appointment for June 2018.  I appreciate the opportunity to care for you. [name], MD, FACG YOU HAD AN ENDOSCOPIC PROCEDURE TODAY AT [facility]:   Refer to the procedure report that was given to you for any specific questions about what was found during the examination.  If the procedure report does not answer your questions, please call your gastroenterologist to clarify.  If you requested that your care partner not be given the details of your procedure findings, then the procedure report has been included in a sealed envelope for you to review at your convenience later.  YOU SHOULD EXPECT: Some feelings of bloating in the abdomen. Passage of more gas than usual.  Walking can help get rid of the air that was put into your GI tract during the procedure and reduce the bloating. If you had a lower endoscopy (such as a colonoscopy or flexible sigmoidoscopy) you may notice spotting of blood in your stool or on the toilet paper. If you underwent a bowel prep for your procedure, you may not have a normal bowel movement for a few days.  Please Note:  You might notice some irritation and congestion in your nose or some drainage.  This is from the oxygen used during your procedure.  There is no need for concern and it should clear up in a day or so.  SYMPTOMS TO REPORT IMMEDIATELY:   Following lower endoscopy (colonoscopy or flexible sigmoidoscopy):  Excessive amounts of blood in the stool  Significant tenderness or worsening of abdominal  pains  Swelling of the abdomen that is new, acute  Fever of 100F or higher   Following upper endoscopy (EGD)  Vomiting of blood or coffee ground material  New chest pain or pain under the shoulder blades  Painful or persistently difficult swallowing  New shortness of breath  Fever of 100F or higher  Black, tarry-looking stools  For urgent or emergent issues, a gastroenterologist can be reached at any hour by calling [phone].   DIET:  We do recommend a small meal at first, but then you may proceed to your regular diet.  Drink plenty of fluids but you should avoid alcoholic beverages for 24 hours.  ACTIVITY:  You should plan to take it easy for the rest of today and you should NOT DRIVE or use heavy machinery until tomorrow (because of the sedation medicines used during the test).    FOLLOW UP: Our staff will call the number listed on your records the next business day following your procedure to check on you and address any questions or concerns that you may have regarding the information given to you following your procedure. If we do not reach you, we will leave a message.  However, if you are feeling well and you are not experiencing any problems, there is no need to return our call.  We will assume that you have returned to your regular daily activities without incident.  If any biopsies were taken you will  be contacted by phone or by letter within the next 1-3 weeks.  Please call us at [phone] if you have not heard about the biopsies in 3 weeks.    SIGNATURES/CONFIDENTIALITY: You and/or your care partner have signed paperwork which will be entered into your electronic medical record.  These signatures attest to the fact that that the information above on your After Visit Summary has been reviewed and is understood.  Full responsibility of the confidentiality of this discharge information lies with you and/or your care-partner.YOU HAD AN ENDOSCOPIC PROCEDURE TODAY AT [facility]:   Refer to the procedure report that was given to you for any specific questions about what was found during the examination.  If the procedure report does not answer your questions, please call your gastroenterologist to clarify.  If you requested that your care partner not be given the details of your procedure findings, then the procedure report has been included in a sealed envelope for you to review at your convenience later.  YOU SHOULD EXPECT: Some feelings of bloating in the abdomen. Passage of more gas than usual.  Walking can help get rid of the air that was put into your GI tract during the procedure and reduce the bloating. If you had a lower endoscopy (such as a colonoscopy or flexible sigmoidoscopy) you may notice spotting of blood in your stool or on the toilet paper. If you underwent a bowel prep for your procedure, you may not have a normal bowel movement for a few days.  Please Note:  You might notice some irritation and congestion in your nose or some drainage.  This is from the oxygen used during your procedure.  There is no need for concern and it should clear up in a day or so.  SYMPTOMS TO REPORT IMMEDIATELY:   Following lower endoscopy (colonoscopy or flexible sigmoidoscopy):  Excessive amounts of blood in the stool  Significant tenderness or worsening of abdominal pains  Swelling of the abdomen that is new, acute  Fever of 100F or higher   Following upper endoscopy (EGD)  Vomiting of blood or coffee ground material  New chest pain or pain under the shoulder blades  Painful or persistently difficult swallowing  New shortness of breath  Fever of 100F or higher  Black, tarry-looking stools  For urgent or emergent issues, a gastroenterologist can be reached at any hour by calling [phone].   DIET:  We do recommend a small meal at first, but then you may proceed to your regular diet.  Drink plenty of fluids but you should avoid  alcoholic beverages for 24 hours.  ACTIVITY:  You should plan to take it easy for the rest of today and you should NOT DRIVE or use heavy machinery until tomorrow (because of the sedation medicines used during the test).    FOLLOW UP: Our staff will call the number listed on your records the next business day following your procedure to check on you and address any questions or concerns that you may have regarding the information given to you following your procedure. If we do not reach you, we will leave a message.  However, if you are feeling well and you are not experiencing any problems, there is no need to return our call.  We will assume that you have returned to your regular daily activities without incident.  If any biopsies were taken you will be contacted by phone or by letter within the next 1-3 weeks.  Please call us at [phone] if you have not heard about the biopsies in 3 weeks.    SIGNATURES/CONFIDENTIALITY: You and/or your care partner have signed paperwork which will be entered into your electronic medical record.  These signatures attest to the fact that that the information above on your After Visit Summary has been reviewed and is understood.  Full responsibility of the confidentiality of this discharge information lies with you and/or your care-partner.  Hiatal hernia information given.  Follow up with Dr. Carlean Purl in June.

## 2017-01-29 NOTE — Progress Notes (Signed)
A/ox3 pleased with MAC, report to Jane RN 

## 2017-01-29 NOTE — Op Note (Signed)
[facility] Patient Name: Kendra Schultz Procedure Date: 01/29/2017 4:13 PM MRN: [id number] Endoscopist: [name] , MD Age: 71 Referring MD:  Date of Birth: 07-Mar-1946 Gender: Female Account #: [id number] Procedure:                Upper GI endoscopy Indications:              Dysphagia, Heartburn Medicines:                Propofol per Anesthesia, Monitored Anesthesia Care Procedure:                Pre-Anesthesia Assessment:                           - Prior to the procedure, a History and Physical                            was performed, and patient medications and                            allergies were reviewed. The patient's tolerance of                            previous anesthesia was also reviewed. The risks                            and benefits of the procedure and the sedation                            options and risks were discussed with the patient.                            All questions were answered, and informed consent                            was obtained. Prior Anticoagulants: The patient has                            taken no previous anticoagulant or antiplatelet                            agents. ASA Grade Assessment: II - A patient with                            mild systemic disease. After reviewing the risks                            and benefits, the patient was deemed in                            satisfactory condition to undergo the procedure.                           After obtaining informed consent, the endoscope was  passed under direct vision. Throughout the                            procedure, the patient's blood pressure, pulse, and                            oxygen saturations were monitored continuously. The                            Endoscope was introduced through the mouth, and                            advanced to the second part of duodenum. The upper                            GI  endoscopy was accomplished without difficulty.                            The patient tolerated the procedure well. Scope In: Scope Out: Findings:                 The examined esophagus was moderately tortuous.                           A small hiatal hernia was present.                           The exam was otherwise without abnormality.                           The cardia and gastric fundus were normal on                            retroflexion. Complications:            No immediate complications. Estimated Blood Loss:     Estimated blood loss: none. Impression:               - Tortuous esophagus.                           - Small hiatal hernia.                           - The examination was otherwise normal.                           - No specimens collected.                           - Suspect GERD and esophageal dysmotility                           Was using a PPI prn                           To stay on PPI regularly and see if that helps  Also chew carefeully. Recommendation:           - Patient has a contact number available for                            emergencies. The signs and symptoms of potential                            delayed complications were discussed with the                            patient. Return to normal activities tomorrow.                            Written discharge instructions were provided to the                            patient.                           - Resume previous diet.                           - Continue present medications.                           - Return to my office in 2 weeks.                           - patient to call and make f/u appointment for June [name], MD 01/29/2017 8:45:13 PM This report has been signed electronically.

## 2017-01-30 ENCOUNTER — Telehealth: Payer: Self-pay | Admitting: *Deleted

## 2017-01-30 NOTE — Telephone Encounter (Signed)
  Follow up Call-  Call back number 01/29/2017  Post procedure Call Back phone  # [phone]  Permission to leave phone message Yes  Some recent data might be hidden     Patient questions:  Do you have a fever, pain , or abdominal swelling? No. Pain Score  0 *  Have you tolerated food without any problems? Yes.    Have you been able to return to your normal activities? Yes.    Do you have any questions about your discharge instructions: Diet   No. Medications  No. Follow up visit  No.  Do you have questions or concerns about your Care? No.  Actions: * If pain score is 4 or above: No action needed, pain <4.

## 2017-02-18 ENCOUNTER — Ambulatory Visit: Payer: Medicare Other | Admitting: Internal Medicine

## 2017-03-18 ENCOUNTER — Other Ambulatory Visit: Payer: Self-pay | Admitting: Hematology

## 2017-03-18 DIAGNOSIS — C50911 Malignant neoplasm of unspecified site of right female breast: Secondary | ICD-10-CM

## 2017-03-18 DIAGNOSIS — Z170 Estrogen receptor positive status [ER+]: Principal | ICD-10-CM

## 2017-03-19 ENCOUNTER — Other Ambulatory Visit (HOSPITAL_BASED_OUTPATIENT_CLINIC_OR_DEPARTMENT_OTHER): Payer: Medicare Other

## 2017-03-19 ENCOUNTER — Ambulatory Visit (HOSPITAL_BASED_OUTPATIENT_CLINIC_OR_DEPARTMENT_OTHER): Payer: Medicare Other

## 2017-03-19 VITALS — BP 124/90 | HR 58 | Temp 97.5°F | Resp 18

## 2017-03-19 DIAGNOSIS — Z170 Estrogen receptor positive status [ER+]: Principal | ICD-10-CM

## 2017-03-19 DIAGNOSIS — C50911 Malignant neoplasm of unspecified site of right female breast: Secondary | ICD-10-CM

## 2017-03-19 DIAGNOSIS — M810 Age-related osteoporosis without current pathological fracture: Secondary | ICD-10-CM

## 2017-03-19 LAB — COMPREHENSIVE METABOLIC PANEL
ALBUMIN: 3.6 g/dL (ref 3.5–5.0)
ALK PHOS: 48 U/L (ref 40–150)
ALT: 116 U/L — ABNORMAL HIGH (ref 0–55)
AST: 102 U/L — AB (ref 5–34)
Anion Gap: 7 mEq/L (ref 3–11)
BUN: 12.7 mg/dL (ref 7.0–26.0)
CO2: 26 mEq/L (ref 22–29)
Calcium: 9.9 mg/dL (ref 8.4–10.4)
Chloride: 108 mEq/L (ref 98–109)
Creatinine: 0.9 mg/dL (ref 0.6–1.1)
EGFR: 64 mL/min/{1.73_m2} — ABNORMAL LOW (ref >90)
GLUCOSE: 116 mg/dL (ref 70–140)
POTASSIUM: 4 meq/L (ref 3.5–5.1)
SODIUM: 142 meq/L (ref 136–145)
Total Bilirubin: 0.26 mg/dL (ref 0.20–1.20)
Total Protein: 6.7 g/dL (ref 6.4–8.3)

## 2017-03-19 LAB — CBC WITH DIFFERENTIAL/PLATELET
BASO%: 0.4 % (ref 0.0–2.0)
BASOS ABS: 0 10*3/uL (ref 0.0–0.1)
EOS ABS: 0.3 10*3/uL (ref 0.0–0.5)
EOS%: 4.1 % (ref 0.0–7.0)
HEMATOCRIT: 39.9 % (ref 34.8–46.6)
HGB: 13.2 g/dL (ref 11.6–15.9)
LYMPH%: 31.3 % (ref 14.0–49.7)
MCH: 29.8 pg (ref 25.1–34.0)
MCHC: 33.1 g/dL (ref 31.5–36.0)
MCV: 90.1 fL (ref 79.5–101.0)
MONO#: 0.4 10*3/uL (ref 0.1–0.9)
MONO%: 6 % (ref 0.0–14.0)
NEUT#: 4 10*3/uL (ref 1.5–6.5)
NEUT%: 58.2 % (ref 38.4–76.8)
PLATELETS: 218 10*3/uL (ref 145–400)
RBC: 4.43 10*6/uL (ref 3.70–5.45)
RDW: 13.6 % (ref 11.2–14.5)
WBC: 6.8 10*3/uL (ref 3.9–10.3)
lymph#: 2.1 10*3/uL (ref 0.9–3.3)

## 2017-03-19 MED ORDER — DENOSUMAB 60 MG/ML ~~LOC~~ SOLN
60.0000 mg | Freq: Once | SUBCUTANEOUS | Status: AC
  Administered 2017-03-19: 60 mg via SUBCUTANEOUS
  Filled 2017-03-19: qty 1

## 2017-03-19 NOTE — Patient Instructions (Signed)
Denosumab injection What is this medicine? DENOSUMAB (den oh sue mab) slows bone breakdown. Prolia is used to treat osteoporosis in women after menopause and in men. Xgeva is used to prevent bone fractures and other bone problems caused by cancer bone metastases. Xgeva is also used to treat giant cell tumor of the bone. This medicine may be used for other purposes; ask your health care provider or pharmacist if you have questions. COMMON BRAND NAME(S): Prolia, XGEVA What should I tell my health care provider before I take this medicine? They need to know if you have any of these conditions: -dental disease -eczema -infection or history of infections -kidney disease or on dialysis -low blood calcium or vitamin D -malabsorption syndrome -scheduled to have surgery or tooth extraction -taking medicine that contains denosumab -thyroid or parathyroid disease -an unusual reaction to denosumab, other medicines, foods, dyes, or preservatives -pregnant or trying to get pregnant -breast-feeding How should I use this medicine? This medicine is for injection under the skin. It is given by a health care professional in a hospital or clinic setting. If you are getting Prolia, a special MedGuide will be given to you by the pharmacist with each prescription and refill. Be sure to read this information carefully each time. For Prolia, talk to your pediatrician regarding the use of this medicine in children. Special care may be needed. For Xgeva, talk to your pediatrician regarding the use of this medicine in children. While this drug may be prescribed for children as young as 13 years for selected conditions, precautions do apply. Overdosage: If you think you've taken too much of this medicine contact a poison control center or emergency room at once. Overdosage: If you think you have taken too much of this medicine contact a poison control center or emergency room at once. NOTE: This medicine is only for  you. Do not share this medicine with others. What if I miss a dose? It is important not to miss your dose. Call your doctor or health care professional if you are unable to keep an appointment. What may interact with this medicine? Do not take this medicine with any of the following medications: -other medicines containing denosumab This medicine may also interact with the following medications: -medicines that suppress the immune system -medicines that treat cancer -steroid medicines like prednisone or cortisone This list may not describe all possible interactions. Give your health care provider a list of all the medicines, herbs, non-prescription drugs, or dietary supplements you use. Also tell them if you smoke, drink alcohol, or use illegal drugs. Some items may interact with your medicine. What should I watch for while using this medicine? Visit your doctor or health care professional for regular checks on your progress. Your doctor or health care professional may order blood tests and other tests to see how you are doing. Call your doctor or health care professional if you get a cold or other infection while receiving this medicine. Do not treat yourself. This medicine may decrease your body's ability to fight infection. You should make sure you get enough calcium and vitamin D while you are taking this medicine, unless your doctor tells you not to. Discuss the foods you eat and the vitamins you take with your health care professional. See your dentist regularly. Brush and floss your teeth as directed. Before you have any dental work done, tell your dentist you are receiving this medicine. Do not become pregnant while taking this medicine or for 5 months after stopping   it. Women should inform their doctor if they wish to become pregnant or think they might be pregnant. There is a potential for serious side effects to an unborn child. Talk to your health care professional or pharmacist for more  information. What side effects may I notice from receiving this medicine? Side effects that you should report to your doctor or health care professional as soon as possible: -allergic reactions like skin rash, itching or hives, swelling of the face, lips, or tongue -breathing problems -chest pain -fast, irregular heartbeat -feeling faint or lightheaded, falls -fever, chills, or any other sign of infection -muscle spasms, tightening, or twitches -numbness or tingling -skin blisters or bumps, or is dry, peels, or red -slow healing or unexplained pain in the mouth or jaw -unusual bleeding or bruising Side effects that usually do not require medical attention (Report these to your doctor or health care professional if they continue or are bothersome.): -muscle pain -stomach upset, gas This list may not describe all possible side effects. Call your doctor for medical advice about side effects. You may report side effects to FDA at 1-800-FDA-1088. Where should I keep my medicine? This medicine is only given in a clinic, doctor's office, or other health care setting and will not be stored at home. NOTE: This sheet is a summary. It may not cover all possible information. If you have questions about this medicine, talk to your doctor, pharmacist, or health care provider.  2015, [employer]. (2012-04-06 12:37:47)  

## 2017-04-08 ENCOUNTER — Ambulatory Visit (INDEPENDENT_AMBULATORY_CARE_PROVIDER_SITE_OTHER): Payer: Medicare Other | Admitting: Internal Medicine

## 2017-04-08 ENCOUNTER — Encounter: Payer: Self-pay | Admitting: Internal Medicine

## 2017-04-08 VITALS — BP 124/70 | HR 84 | Ht 61.75 in | Wt 152.6 lb

## 2017-04-08 DIAGNOSIS — K219 Gastro-esophageal reflux disease without esophagitis: Secondary | ICD-10-CM | POA: Diagnosis not present

## 2017-04-08 DIAGNOSIS — T887XXA Unspecified adverse effect of drug or medicament, initial encounter: Secondary | ICD-10-CM

## 2017-04-08 DIAGNOSIS — R1319 Other dysphagia: Secondary | ICD-10-CM

## 2017-04-08 DIAGNOSIS — R1310 Dysphagia, unspecified: Secondary | ICD-10-CM | POA: Diagnosis not present

## 2017-04-08 MED ORDER — PANTOPRAZOLE SODIUM 40 MG PO TBEC: 40.0000 mg | DELAYED_RELEASE_TABLET | Freq: Every day | ORAL | 3 refills | Status: DC

## 2017-04-08 NOTE — Progress Notes (Signed)
Kendra Schultz 71 y.o. 1946/03/18 [id number]  Assessment & Plan:   Encounter Diagnoses  Name Primary?  . Gastroesophageal reflux disease without esophagitis Yes  . Esophageal dysphagia   . Medication side effect - connstipation from omeprazole    I think she is improved. She has an intolerance or side effect omeprazole with constipation and she was only on a low dose of that. I'm going to try pantoprazole 40 mg daily. I've explained that she needs to take this for 2 months straight, if that is not working well after that she is to contact me. I reemphasized GERD diet.  I appreciate the opportunity to care for this patient. CC: Long, Nicki Reaper, PA-C  Subjective:   Chief Complaint: Here for follow-up of dysphagia after EGD and dilation April 2018  HPI She had an EGD in April it demonstrated a small hiatal hernia and a tortuous esophagus, she was dilated with a 24 [place] and told to take her omeprazole regularly.She believes the omeprazole caused constipation. She was seen by primary care late May and was given a sample of Dexilant 60 milligrams which he took for a few days but didn't notice any difference. I don't think she's having dysphagia anymore but she is having reflux symptoms. Intermittent regurgitation belching and gas. 1 sometimes 2 cups of coffee a day no tobacco. Allergies  Allergen Reactions  . Cephalexin Nausea And Vomiting   Current Meds  Medication Sig  . albuterol (PROVENTIL HFA;VENTOLIN HFA) 108 (90 BASE) MCG/ACT inhaler Inhale 2 puffs into the lungs every 6 (six) hours as needed for wheezing. Reported on 03/20/2016  . benazepril-hydrochlorthiazide (LOTENSIN HCT) 20-25 MG per tablet Take 1 tablet by mouth daily.  Marland Kitchen CALCIUM PO Take 1,200 mg by mouth daily.  . Cholecalciferol (VITAMIN D PO) Take 2,000 Units by mouth daily.   . Cyclobenzaprine HCl (FLEXERIL PO) Take 1 tablet by mouth daily as needed.  . famotidine (PEPCID) 10 MG tablet Take 10 mg by mouth 2  (two) times daily as needed.   Marland Kitchen ibuprofen (ADVIL,MOTRIN) 200 MG tablet Take 400-600 mg by mouth every 6 (six) hours as needed. For pain   . tamoxifen (NOLVADEX) 20 MG tablet Take 1 tablet (20 mg total) by mouth daily.   Past Medical History:  Diagnosis Date  . Anemia   . Arthritis   . Breast cancer ([place]) 09/06/2013  . Cancer ([place]) 1995, 2012   breast bilateral   . Cataract   . GERD (gastroesophageal reflux disease)   . Hypertension   . Osteoporosis    Past Surgical History:  Procedure Laterality Date  . APPENDECTOMY    . BREAST LUMPECTOMY  1997   right  . CATARACT EXTRACTION, BILATERAL    . ESOPHAGOGASTRODUODENOSCOPY    . MASTECTOMY  1995   left  . MASTECTOMY W/ SENTINEL NODE BIOPSY  09/17/2011   Procedure: MASTECTOMY WITH SENTINEL LYMPH NODE BIOPSY;  Surgeon: Joyice Faster. Cornett, MD;  Location: [facility] [facility];  Service: General;  Laterality: Right;  nuclear medicine injection 30 minutes prior for surgery   . OVARIAN CYST REMOVAL    . TONSILLECTOMY     Review of Systems As per history of present illness.  Objective:   Physical Exam BP 124/70   Pulse 84   Ht 5' 1.75" (1.568 m)   Wt 152 lb 9.6 oz (69.2 kg)   BMI 28.14 kg/m  No acute distress  15 minutes time spent with patient > half in counseling coordination of care

## 2017-04-08 NOTE — Patient Instructions (Signed)
  Normal BMI (Body Mass Index- based on height and weight) is between 23 and 30. Your BMI today is Body mass index is 28.14 kg/m. Marland Kitchen Please consider follow up  regarding your BMI with your Primary Care Provider.   We have sent the following medications to your pharmacy for you to pick up at your convenience: Pantoprazole  Try the pantoprazole for 2 months at least, let us know if not helping or giving you any problems.   Follow up with Dr Carlean Purl as needed.    I appreciate the opportunity to care for you. Silvano Rusk, MD, [facility]

## 2017-05-02 NOTE — Progress Notes (Signed)
[name] CLINICAL FOLLOW UP NOTE  ID: Kendra Schultz OB: 1946-06-27  MR#: [id number]  TMA#:[id number]  PCP: Shanon Rosser, PA-C GYN:   SU: Dr. Brantley Stage OTHER MD:  CHIEF COMPLAINT: Patient with h/o right sided invasive ductal carcinoma here for follow up.    BREAST CANCER HISTORY:   Cancer of right breast, stage 1 ([place])   1995 Cancer Diagnosis    1995 patient had a mastectomy on the left side that showed a DCIS she was not given adjuvant hormonal therapy. This was then off load [place]. In 1997 patient had a lumpectomy of the right side but she did not receive any adjuvant radiation or chemothera      08/27/2011 Initial Biopsy    right breast core biopsy, showed IDA and DCIS       09/07/2011 Receptors her2    ER 89%+, PR 9%+, HER2-, Ki67 9%       09/22/2011 Surgery    Right mastectomy, path revealed DCIS, no residual invasive tumor (previous biopsy IDA 0.6cm, grade II), negative margines, one node negative      09/23/2011 Miscellaneous    Genetic test: BRCA1(-), BRCA2 (-)       09/25/2011 Oncotype testing    RS 10, predicts 10-year recurrence risk of 7% with Tamoxifen alone. low risk.       10/17/2011 -  Anti-estrogen oral therapy    Arimidex 64m dialy, switched to Aromasin in 02/2015 due to arthritis, and switched to Tamoxifen in 06/2015       01/29/2017 Procedure     Upper Endoscopy  - The examined esophagus was moderately tortuous. - A small hiatal hernia was present. - The exam was otherwise without abnormality. - The cardia and gastric fundus were normal on retroflexion.        CURRENT THERAPY:   Tamoxifen 20 mg once daily, run out in mid Dec 2017  INTERVAL HISTORY: Kendra Schultz here today for follow-up of her right breast cancer. She has been very gassy lately. She denies any constipation. She has been taking tamoxifen. She denies any significant pain and her energy level is normal. She has occasional hot flashes that are tolerable She has no other  concerns. She does not want to come to the clinic any longer for follow ups   REVIEW OF SYSTEMS:   Constitutional: Denies fevers, chills or abnormal night sweats (+)tolerable hot flashes Eyes: Denies blurriness of vision, double vision or watery eyes Ears, nose, mouth, throat, and face: Denies mucositis or sore throat Respiratory: Denies cough, dyspnea or wheezes Cardiovascular: Denies palpitation, chest discomfort or lower extremity swelling Gastrointestinal:  Denies nausea, heartburn or change in bowel habits (+)gassy Skin: Denies abnormal skin rashes Lymphatics: Denies new lymphadenopathy or easy bruising Neurological:Denies numbness, tingling or new weaknesses Behavioral/Psych: Mood is stable, no new changes   All other systems were reviewed with the patient and are negative.  PAST MEDICAL HISTORY: Past Medical History:  Diagnosis Date  . Anemia   . Arthritis   . [place]  . Cancer ([place], [place]   breast bilateral   . Cataract   . GERD (gastroesophageal reflux disease)   . Hypertension   . Osteoporosis     PAST SURGICAL HISTORY: Past Surgical History:  Procedure Laterality Date  . APPENDECTOMY    . BREAST LUMPECTOMY  1997   right  . CATARACT EXTRACTION, BILATERAL    . ESOPHAGOGASTRODUODENOSCOPY    . MASTECTOMY  1995   left  .  MASTECTOMY W/ SENTINEL NODE BIOPSY  09/17/2011   Procedure: MASTECTOMY WITH SENTINEL LYMPH NODE BIOPSY;  Surgeon: Joyice Faster. Cornett, MD;  Location: [place];  Service: General;  Laterality: Right;  nuclear medicine injection 30 minutes prior for surgery   . OVARIAN CYST REMOVAL    . TONSILLECTOMY      FAMILY HISTORY Family History  Problem Relation Age of Onset  . Heart disease Father   . Cancer Father        lymphoma  . Heart disease Brother     GYNECOLOGIC HISTORY: menarche 65, G2 P2, was on birth control previously, without any problems, no h/o sexually transmitted infections  SOCIAL HISTORY: Lives with  mother, sister, and 2 sons ages 55 and 13.  Patient is retired, she enjoys puzzles and shopping with her free time.     ADVANCED DIRECTIVES: not in place   HEALTH MAINTENANCE: Social History  Substance Use Topics  . Smoking status: Former Smoker    Packs/day: 1.00    Types: Cigarettes    Quit date: 11/22/2011  . Smokeless tobacco: Never Used  . Alcohol use No     Comment: occasionally    Mammogram: 08/2011, s/p bilateral mastectomy Colonoscopy: no, refuses to have one Bone Density Scan: 03/28/2014 Pap Smear: last in about 2009 Eye Exam: 03/10/14 Vitamin D Level:  06/2012 Lipid Panel: 2015   Allergies  Allergen Reactions  . Cephalexin Itching and Nausea And Vomiting    Current Outpatient Prescriptions  Medication Sig Dispense Refill  . albuterol (PROVENTIL HFA;VENTOLIN HFA) 108 (90 BASE) MCG/ACT inhaler Inhale 2 puffs into the lungs every 6 (six) hours as needed for wheezing. Reported on 03/20/2016    . benazepril-hydrochlorthiazide (LOTENSIN HCT) 20-25 MG per tablet Take 1 tablet by mouth daily.    Marland Kitchen CALCIUM PO Take 1,200 mg by mouth daily.    . Cholecalciferol (VITAMIN D PO) Take 2,000 Units by mouth daily.     . Cyclobenzaprine HCl (FLEXERIL PO) Take 1 tablet by mouth daily as needed.    . famotidine (PEPCID) 10 MG tablet Take 10 mg by mouth 2 (two) times daily as needed.     Marland Kitchen ibuprofen (ADVIL,MOTRIN) 200 MG tablet Take 400-600 mg by mouth every 6 (six) hours as needed. For pain     . pantoprazole (PROTONIX) 40 MG tablet Take 1 tablet (40 mg total) by mouth daily before breakfast. 90 tablet 3  . tamoxifen (NOLVADEX) 20 MG tablet Take 1 tablet (20 mg total) by mouth daily. 90 tablet 3   No current facility-administered medications for this visit.     OBJECTIVE:  Vitals:   05/05/17 1150  BP: (!) 147/87  Pulse: 62  Resp: 18  Temp: 98.9 F (37.2 C)     Body mass index is 29.85 kg/m.     GENERAL: Patient is a well appearing female in no acute distress HEENT:  Sclerae  anicteric.  Oropharynx clear and moist. No ulcerations or evidence of oropharyngeal candidiasis. Neck is supple.  NODES:  No cervical, supraclavicular, or axillary lymphadenopathy palpated.  BREAST EXAM: s/p bilateral mastectomies, no nodularity, no skin change, no sign of recurrence.   LUNGS:  Clear to auscultation bilaterally.  No wheezes or rhonchi. HEART:  Regular rate and rhythm. No murmur appreciated. ABDOMEN:  Soft, nontender.  Positive, normoactive bowel sounds. No organomegaly palpated. MSK:  No focal spinal tenderness to palpation. Full range of motion bilaterally in the upper extremities. EXTREMITIES:  No peripheral edema.   SKIN:  Clear  with no obvious rashes or skin changes. No nail dyscrasia. NEURO:  Nonfocal. Well oriented.  Appropriate affect. ECOG FS:1 - Symptomatic but completely ambulatory  LAB RESULTS: CBC Latest Ref Rng & Units 05/05/2017 03/19/2017 11/04/2016  WBC 3.9 - 10.3 10e3/uL 7.0 6.8 6.7  Hemoglobin 11.6 - 15.9 g/dL 12.6 13.2 13.0  Hematocrit 34.8 - 46.6 % 38.0 39.9 38.6  Platelets 145 - 400 10e3/uL 204 218 224    CMP Latest Ref Rng & Units 05/05/2017 03/19/2017 11/04/2016  Glucose 70 - 140 mg/dl 91 116 76  BUN 7.0 - 26.0 mg/dL 17.8 12.7 11.7  Creatinine 0.6 - 1.1 mg/dL 0.9 0.9 0.8  Sodium 136 - 145 mEq/L 141 142 143  Potassium 3.5 - 5.1 mEq/L 4.4 4.0 3.9  Chloride 98 - 107 mEq/L - - -  CO2 22 - 29 mEq/L 28 26 26   Calcium 8.4 - 10.4 mg/dL 9.8 9.9 9.4  Total Protein 6.4 - 8.3 g/dL 6.6 6.7 6.8  Total Bilirubin 0.20 - 1.20 mg/dL 0.25 0.26 0.26  Alkaline Phos 40 - 150 U/L 44 48 51  AST 5 - 34 U/L 58(H) 102(H) 64(H)  ALT 0 - 55 U/L 65(H) 116(H) 91(H)     STUDIES: Bone density scan on 04/04/2016 - ASSESSMENT: The BMD measured at AP Spine L3-L4 is 0.949 g/cm2 with a T-score of -2.1. This patient is considered osteopenic according to [facility]) criteria. L-1, L-2 were excluded due to degenerative changes. There has been a statistically  significant increase in BMD of Lumbar spine and no ststistically significant change in left hip since prior exam dated 03/28/2014.  Upper Endoscopy 01/29/2017 - The examined esophagus was moderately tortuous. - A small hiatal hernia was present. - The exam was otherwise without abnormality. - The cardia and gastric fundus were normal on retroflexion.  ASSESSMENT: 72 y.o. [place], [place] woman   1. 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 S/p bilateral mastectomy -She is clinically doing well overall -lab reviewed, CBC and CMP are unremarkable except mild transaminitis, which has been stable, today's physical exam revealed no evidence of recurrence -Given her significant arthralgia, I have switched her from Aromasin to tamoxifen. She is tolerating tamoxifen well, will continue. We plan to complete a 6 year adjuvant hormonal therapy, until 09/2017  -She previously  ran out of tamoxifenand wants to know if she can stop tamoxifen because she has completed 5 years of adjuvant endocrine therapy. I encouraged her to take one more year of tamoxifen since she has been tolerating well, she agreed and restarted. -She will complete adjuvant tamoxifen in December 2018. -She will continue surveillance with physical exam and routine lab.  -I encouraged her to have healthy diet and regular exercise. She is not very active due to the arthritis and back pain. -I'll see her back in 5 months for follow-up and prolia injection.  2. Osteoporosis:  -Patient was previously on Fosamax but had difficulty tolerating it.   -She will  continue Prolia every 6 months. She is tolerating well. -repeated DEXA scan in 04/2016 showed significant improvement in lumbar spine and no change in left hip since 2015   3. GERD -Improved, she takes Nexium as needed  4. Arthralgia and back  pain -overall stable and tolerable  -She will follow up with her primary care physician next months.  5. transaminitis -  She has had intermittent elevated liver enzymes since 2014, <100, possible related to tamoxifen, her CT in 07/2014 also showed fatty liver  -She ran out of tamoxifen one month ago, repeated CMP today showed similar level transaminitis, I do not think this is related to her tamoxifen. -I encouraged her to eat healthy, and try to lose some weight. -I encouraged her to follow-up with her primary care physician  Plan -continue tamoxifen until 09/2017 -Return to clinic in early December for follow-up with lab, Prolia injection  I spent 20 minutes counseling the patient face to face.  The total time spent in the appointment was 25 minutes.  This document serves as a record of services personally performed by Truitt Merle, MD. It was created on her behalf by Brandt Loosen, a trained medical scribe. The creation of this record is based on the scribe's personal observations and the provider's statements to them. This document has been checked and approved by the attending provider.   Truitt Merle   05/05/2017

## 2017-05-05 ENCOUNTER — Ambulatory Visit (HOSPITAL_BASED_OUTPATIENT_CLINIC_OR_DEPARTMENT_OTHER): Payer: Medicare Other | Admitting: Hematology

## 2017-05-05 ENCOUNTER — Other Ambulatory Visit (HOSPITAL_BASED_OUTPATIENT_CLINIC_OR_DEPARTMENT_OTHER): Payer: Medicare Other

## 2017-05-05 ENCOUNTER — Encounter: Payer: Self-pay | Admitting: Hematology

## 2017-05-05 ENCOUNTER — Telehealth: Payer: Self-pay | Admitting: Hematology

## 2017-05-05 VITALS — BP 147/87 | HR 62 | Temp 98.9°F | Resp 18 | Ht 61.0 in | Wt 158.0 lb

## 2017-05-05 DIAGNOSIS — Z79810 Long term (current) use of selective estrogen receptor modulators (SERMs): Secondary | ICD-10-CM

## 2017-05-05 DIAGNOSIS — M549 Dorsalgia, unspecified: Secondary | ICD-10-CM

## 2017-05-05 DIAGNOSIS — K219 Gastro-esophageal reflux disease without esophagitis: Secondary | ICD-10-CM

## 2017-05-05 DIAGNOSIS — C50911 Malignant neoplasm of unspecified site of right female breast: Secondary | ICD-10-CM

## 2017-05-05 DIAGNOSIS — M2550 Pain in unspecified joint: Secondary | ICD-10-CM | POA: Diagnosis not present

## 2017-05-05 DIAGNOSIS — D0511 Intraductal carcinoma in situ of right breast: Secondary | ICD-10-CM

## 2017-05-05 DIAGNOSIS — M810 Age-related osteoporosis without current pathological fracture: Secondary | ICD-10-CM

## 2017-05-05 DIAGNOSIS — Z170 Estrogen receptor positive status [ER+]: Secondary | ICD-10-CM

## 2017-05-05 DIAGNOSIS — R740 Nonspecific elevation of levels of transaminase and lactic acid dehydrogenase [LDH]: Secondary | ICD-10-CM

## 2017-05-05 LAB — COMPREHENSIVE METABOLIC PANEL
ALT: 65 U/L — AB (ref 0–55)
ANION GAP: 6 meq/L (ref 3–11)
AST: 58 U/L — AB (ref 5–34)
Albumin: 3.4 g/dL — ABNORMAL LOW (ref 3.5–5.0)
Alkaline Phosphatase: 44 U/L (ref 40–150)
BILIRUBIN TOTAL: 0.25 mg/dL (ref 0.20–1.20)
BUN: 17.8 mg/dL (ref 7.0–26.0)
CHLORIDE: 107 meq/L (ref 98–109)
CO2: 28 meq/L (ref 22–29)
CREATININE: 0.9 mg/dL (ref 0.6–1.1)
Calcium: 9.8 mg/dL (ref 8.4–10.4)
EGFR: 68 mL/min/{1.73_m2} — ABNORMAL LOW (ref >90)
GLUCOSE: 91 mg/dL (ref 70–140)
Potassium: 4.4 mEq/L (ref 3.5–5.1)
Sodium: 141 mEq/L (ref 136–145)
TOTAL PROTEIN: 6.6 g/dL (ref 6.4–8.3)

## 2017-05-05 LAB — CBC WITH DIFFERENTIAL/PLATELET
BASO%: 0.3 % (ref 0.0–2.0)
Basophils Absolute: 0 10*3/uL (ref 0.0–0.1)
EOS%: 2.9 % (ref 0.0–7.0)
Eosinophils Absolute: 0.2 10*3/uL (ref 0.0–0.5)
HCT: 38 % (ref 34.8–46.6)
HGB: 12.6 g/dL (ref 11.6–15.9)
LYMPH%: 31.9 % (ref 14.0–49.7)
MCH: 29.7 pg (ref 25.1–34.0)
MCHC: 33.2 g/dL (ref 31.5–36.0)
MCV: 89.6 fL (ref 79.5–101.0)
MONO#: 0.5 10*3/uL (ref 0.1–0.9)
MONO%: 7.3 % (ref 0.0–14.0)
NEUT#: 4 10*3/uL (ref 1.5–6.5)
NEUT%: 57.6 % (ref 38.4–76.8)
PLATELETS: 204 10*3/uL (ref 145–400)
RBC: 4.24 10*6/uL (ref 3.70–5.45)
RDW: 13.3 % (ref 11.2–14.5)
WBC: 7 10*3/uL (ref 3.9–10.3)
lymph#: 2.2 10*3/uL (ref 0.9–3.3)

## 2017-05-05 NOTE — Telephone Encounter (Signed)
Gv pt apt for 09/29/17.

## 2017-08-07 ENCOUNTER — Encounter: Payer: Self-pay | Admitting: Genetics

## 2017-09-29 ENCOUNTER — Ambulatory Visit: Payer: Medicare Other

## 2017-09-29 ENCOUNTER — Other Ambulatory Visit: Payer: Medicare Other

## 2017-09-29 ENCOUNTER — Ambulatory Visit: Payer: Medicare Other | Admitting: Hematology

## 2017-10-09 ENCOUNTER — Telehealth: Payer: Self-pay | Admitting: Hematology

## 2017-10-09 NOTE — Telephone Encounter (Signed)
Spoke to patient regarding upcoming January appointments.  °

## 2017-10-27 ENCOUNTER — Other Ambulatory Visit: Payer: Medicare Other

## 2017-10-27 ENCOUNTER — Ambulatory Visit: Payer: Medicare Other

## 2017-10-27 ENCOUNTER — Ambulatory Visit: Payer: Medicare Other | Admitting: Hematology

## 2017-10-31 ENCOUNTER — Telehealth: Payer: Self-pay | Admitting: Hematology

## 2017-10-31 NOTE — Telephone Encounter (Signed)
Scheduled appt per 1/10 sch msg - attempted to leave voicemail but phone kept ringing and never went to voicemail. Sending confirmation letter in mail.

## 2017-11-17 ENCOUNTER — Ambulatory Visit: Payer: Medicare Other

## 2017-11-17 ENCOUNTER — Ambulatory Visit: Payer: Medicare Other | Admitting: Hematology

## 2017-11-17 ENCOUNTER — Other Ambulatory Visit: Payer: Medicare Other

## 2019-04-07 ENCOUNTER — Ambulatory Visit: Payer: Medicare Other | Admitting: Obstetrics & Gynecology

## 2019-04-07 ENCOUNTER — Other Ambulatory Visit: Payer: Self-pay

## 2019-04-07 ENCOUNTER — Other Ambulatory Visit (HOSPITAL_COMMUNITY)
Admission: RE | Admit: 2019-04-07 | Discharge: 2019-04-07 | Disposition: A | Discharge status: Home / Self Care | Payer: Medicare Other | Source: Ambulatory Visit | Attending: Obstetrics & Gynecology | Admitting: Obstetrics & Gynecology

## 2019-04-07 ENCOUNTER — Encounter: Payer: Self-pay | Admitting: Obstetrics & Gynecology

## 2019-04-07 VITALS — BP 131/82 | HR 86 | Temp 99.9°F | Wt 163.6 lb

## 2019-04-07 DIAGNOSIS — N950 Postmenopausal bleeding: Secondary | ICD-10-CM | POA: Diagnosis not present

## 2019-04-07 NOTE — Progress Notes (Signed)
New Patient is in the office for vaginal bleeding that started in May. Pt states that she has cramps similar to menstrual periods, and the bleeding goes back and forth between heavy and light.

## 2019-04-07 NOTE — Progress Notes (Signed)
Patient ID: Kendra Schultz, female   DOB: 1945/12/02, 73 y.o.   MRN: [id number]  Chief Complaint  Patient presents with  . New Patient (Initial Visit)  postmenopausal bleeding  HPI Kendra Schultz is a 73 y.o. female.  [id number] LMP approximately age 7. She had bilateral mastectomy 2012 and she took tamoxifen for about 5 years. No oncology f/u now. Her PCP referred her to evaluate vaginal bleeding that started in May and still comes and goes. Occasionally heavy, just spotting recently HPI  Past Medical History:  Diagnosis Date  . Anemia   . Arthritis   . Breast cancer ([place]) 09/06/2013  . Cancer ([place]) 1995, 2012   breast bilateral   . Cataract   . GERD (gastroesophageal reflux disease)   . Hypertension   . Osteoporosis     Past Surgical History:  Procedure Laterality Date  . APPENDECTOMY    . BREAST LUMPECTOMY  1997   right  . CATARACT EXTRACTION, BILATERAL    . ESOPHAGOGASTRODUODENOSCOPY    . MASTECTOMY  1995   left  . MASTECTOMY W/ SENTINEL NODE BIOPSY  09/17/2011   Procedure: MASTECTOMY WITH SENTINEL LYMPH NODE BIOPSY;  Surgeon: Joyice Faster. Cornett, MD;  Location: [place];  Service: General;  Laterality: Right;  nuclear medicine injection 30 minutes prior for surgery   . OVARIAN CYST REMOVAL    . TONSILLECTOMY      Family History  Problem Relation Age of Onset  . Heart disease Father   . Cancer Father        lymphoma  . Heart disease Brother     Social History Social History   Tobacco Use  . Smoking status: Former Smoker    Packs/day: 1.00    Types: Cigarettes    Quit date: 11/22/2011    Years since quitting: 7.3  . Smokeless tobacco: Never Used  Substance Use Topics  . Alcohol use: No    Comment: occasionally  . Drug use: No    Allergies  Allergen Reactions  . Cephalexin Itching and Nausea And Vomiting    Current Outpatient Medications  Medication Sig Dispense Refill  . albuterol (PROVENTIL HFA;VENTOLIN HFA) 108 (90 BASE) MCG/ACT inhaler Inhale 2 puffs  into the lungs every 6 (six) hours as needed for wheezing. Reported on 03/20/2016    . benazepril-hydrochlorthiazide (LOTENSIN HCT) 20-25 MG per tablet Take 1 tablet by mouth daily.    Marland Kitchen CALCIUM PO Take 1,200 mg by mouth daily.    . Cholecalciferol (VITAMIN D PO) Take 2,000 Units by mouth daily.     . Cyclobenzaprine HCl (FLEXERIL PO) Take 1 tablet by mouth daily as needed.    Marland Kitchen ibuprofen (ADVIL,MOTRIN) 200 MG tablet Take 400-600 mg by mouth every 6 (six) hours as needed. For pain     . pantoprazole (PROTONIX) 40 MG tablet Take 1 tablet (40 mg total) by mouth daily before breakfast. 90 tablet 3  . famotidine (PEPCID) 10 MG tablet Take 10 mg by mouth 2 (two) times daily as needed.     . tamoxifen (NOLVADEX) 20 MG tablet Take 1 tablet (20 mg total) by mouth daily. (Patient not taking: Reported on 04/07/2019) 90 tablet 3   No current facility-administered medications for this visit.     Review of Systems Review of Systems  Constitutional: Negative.   Respiratory: Positive for cough.   Gastrointestinal: Negative.   Genitourinary: Positive for frequency, vaginal bleeding and vaginal discharge. Negative for pelvic pain and vaginal pain.    Blood pressure 131/82,  pulse 86, temperature 99.9 F (37.7 C), temperature source Oral, weight 163 lb 9.6 oz (74.2 kg).  Physical Exam Physical Exam Constitutional:      Appearance: Normal appearance.  Cardiovascular:     Rate and Rhythm: Normal rate.  Pulmonary:     Effort: Pulmonary effort is normal.  Abdominal:     General: Abdomen is flat.     Palpations: Abdomen is soft.  Genitourinary:    General: Normal vulva.     Vagina: Vaginal discharge (very small amount, no blood) present.     Comments: Cervix no lesion, no pelvic mass. Minimal tenderness Skin:    General: Skin is warm and dry.  Neurological:     General: No focal deficit present.     Mental Status: She is alert.  Psychiatric:        Mood and Affect: Mood normal.        Behavior:  Behavior normal.     Data Reviewed CLINICAL DATA:  History of right breast carcinoma with mastectomy 3 years ago, remote history of left breast carcinoma, ascites  EXAM: CT ABDOMEN AND PELVIS WITH CONTRAST  TECHNIQUE: Multidetector CT imaging of the abdomen and pelvis was performed using the standard protocol following bolus administration of intravenous contrast.  CONTRAST:  158mL OMNIPAQUE IOHEXOL 300 MG/ML  SOLN  BUN and creatinine were obtained on site at [place] at [address]Results: BUN 16 mg/dL, Creatinine 0.9 mg/dL.  COMPARISON:  None.  FINDINGS: The lung bases are clear. There is some thickening of the mucosa of the distal esophagus of uncertain significance possibly due to esophagitis. Correlate clinically. The liver is low in attenuation diffusely most consistent with fatty infiltration. No focal hepatic abnormality is seen. Several gallstones are noted within the gallbladder. The gallbladder wall is not thickened. The pancreas is normal in size and the pancreatic duct is not dilated. The right adrenal gland is unremarkable, and there is a 9 mm incidental left adrenal adenomas present the spleen is normal in size. The kidneys enhance with no calculus or mass, and no hydronephrosis is seen. The abdominal aorta is normal in caliber. No adenopathy is seen, with small retroperitoneal nodes present.  The urinary bladder is not well distended but no abnormality is noted. The uterus is normal in size. There is some nodularity to the fundus and small uterine fibroid is a definite consideration. No adnexal lesion is seen. There is no fluid noted within the pelvis. Multiple rectosigmoid colonic diverticula are present with changes of diverticulosis. Diverticula also are present throughout the descending colon. The terminal ileum is unremarkable and the appendix is not definitely seen. The lumbar vertebrae are in normal alignment.   IMPRESSION: 1. No ascites is seen. There is no evidence of metastatic involvement of the abdomen and pelvis. 2. Gallstones. 3. Low-attenuation of the liver consistent with fatty infiltration. 4. Somewhat thickened mucosa of the distal esophagus. Question esophagitis. Correlate clinically. 5. Probable small uterine fundal fibroid. 6. Multiple rectosigmoid and descending colon.   Electronically Signed   By: Ivar Drape M.D.   On: 08/18/2014 15:13 Assessment Postmenopausal bleeding H/o breast cancer, exposure to tamoxifen, no longer taking Uterine fibroid seen on CT 2015, I reviewed images  Plan Pelvic US ordered Vaginal discharge tested for vaginitis RTC, possibly for EMBx     Emeterio Reeve 04/07/2019, 10:22 AM

## 2019-04-07 NOTE — Patient Instructions (Signed)
Postmenopausal Bleeding    Postmenopausal bleeding is any bleeding that happens after menopause. Menopause is when a woman's period stops. Any type of bleeding after menopause should be checked by your doctor. Treatment will depend on the cause.  Follow these instructions at home:   Pay attention to any changes in your symptoms.   Avoid using tampons and douches as told by your doctor.   Change your pads regularly.   Get regular pelvic exams and Pap tests.   Take iron pills as told by your doctor.   Take over-the-counter and prescription medicines only as told by your doctor.   Keep all follow-up visits as told by your doctor. This is important.  Contact a doctor if:   Your bleeding lasts for more than 1 week.   You have belly (abdominal) pain.   You have bleeding during or after sex (intercourse).   You have bleeding that happens more often than every 3 weeks.  Get help right away if:   You have a fever, chills, a headache, dizziness, muscle aches, and bleeding.   You have strong pain with bleeding.   You have clumps of blood (blood clots) coming from your vagina.   You have a lot of bleeding and:  ? You need more than 1 pad an hour.  ? This has never happened before.   You feel like you are going to pass out (faint).  Summary   Any type of bleeding after menopause should be checked by your doctor.   Pay attention to any changes in your symptoms.   Keep all follow-up visits as told by your doctor.  This information is not intended to replace advice given to you by your health care provider. Make sure you discuss any questions you have with your health care provider.  Document Released: 07/16/2008 Document Revised: 11/12/2016 Document Reviewed: 11/12/2016  Elsevier Interactive Patient Education  2019 Elsevier Inc.

## 2019-04-08 LAB — CERVICOVAGINAL ANCILLARY ONLY
Bacterial vaginitis: NEGATIVE
Candida vaginitis: NEGATIVE
Chlamydia: NEGATIVE
Neisseria Gonorrhea: NEGATIVE
Trichomonas: NEGATIVE

## 2019-04-14 ENCOUNTER — Ambulatory Visit (HOSPITAL_COMMUNITY)
Admission: RE | Admit: 2019-04-14 | Discharge: 2019-04-14 | Disposition: A | Discharge status: Home / Self Care | Payer: Medicare Other | Source: Ambulatory Visit | Attending: Obstetrics & Gynecology | Admitting: Obstetrics & Gynecology

## 2019-04-14 ENCOUNTER — Other Ambulatory Visit: Payer: Self-pay

## 2019-04-14 DIAGNOSIS — N950 Postmenopausal bleeding: Secondary | ICD-10-CM

## 2019-04-15 ENCOUNTER — Other Ambulatory Visit: Payer: Self-pay | Admitting: Obstetrics & Gynecology

## 2019-04-15 MED ORDER — MISOPROSTOL 200 MCG PO TABS: ORAL_TABLET | ORAL | 0 refills | Status: DC

## 2019-05-05 ENCOUNTER — Ambulatory Visit: Payer: Medicare Other | Admitting: Obstetrics & Gynecology

## 2019-05-05 ENCOUNTER — Other Ambulatory Visit: Payer: Self-pay

## 2019-05-05 ENCOUNTER — Other Ambulatory Visit (HOSPITAL_COMMUNITY)
Admission: RE | Admit: 2019-05-05 | Discharge: 2019-05-05 | Disposition: A | Discharge status: Home / Self Care | Payer: Medicare Other | Source: Ambulatory Visit | Attending: Obstetrics & Gynecology | Admitting: Obstetrics & Gynecology

## 2019-05-05 ENCOUNTER — Encounter: Payer: Self-pay | Admitting: Obstetrics & Gynecology

## 2019-05-05 VITALS — BP 150/76 | HR 65 | Wt 162.5 lb

## 2019-05-05 DIAGNOSIS — N950 Postmenopausal bleeding: Secondary | ICD-10-CM | POA: Insufficient documentation

## 2019-05-05 NOTE — Progress Notes (Signed)
Patient ID: Kendra Schultz, female   DOB: [birth year], 73 y.o.   MRN: [id number]   No bleeding since last visit. [place] result reviewed   CLINICAL DATA:  Postmenopausal bleeding  EXAM: TRANSABDOMINAL AND TRANSVAGINAL ULTRASOUND OF PELVIS  TECHNIQUE: Both transabdominal and transvaginal ultrasound examinations of the pelvis were performed. Transabdominal technique was performed for global imaging of the pelvis including uterus, ovaries, adnexal regions, and pelvic cul-de-sac. It was necessary to proceed with endovaginal exam following the transabdominal exam to visualize the ovaries and adnexa.  COMPARISON:  None  Correlation: CT abdomen and pelvis 08/18/2014  FINDINGS: Uterus  Measurements: 11.9 x 5.2 x 4.6 cm = volume: 147 mL. Heterogeneous myometrial echogenicity. Subserosal leiomyoma at uterine fundus anteriorly, slightly hyperechoic compared to adjacent myometrium, 2.2 x 3.0 x 2.1 cm. Additional smaller LEFT lateral uterine leiomyoma is seen 2.7 x 1.8 x 2.6 cm.  Endometrium  Thickness: 12 mm, abnormally thickened. No endometrial fluid or focal mass  Right ovary  Measurements: 2.8 x 2.7 x 2.3 cm = volume: 9.0 mL. Normal morphology without mass  Left ovary  Not visualized on either transabdominal or endovaginal imaging, likely obscured by bowel  Other findings  No free fluid or adnexal masses.  IMPRESSION: 2 uterine leiomyomata.  Nonvisualization of LEFT ovary.  12 mm endometrial complex, abnormally thickened in a postmenopausal patient with bleeding.  In the setting of post-menopausal bleeding, endometrial sampling is indicated to exclude carcinoma. If results are benign, sonohysterogram should be considered for focal lesion work-up. (Ref: Radiological Reasoning: Algorithmic Workup of Abnormal Vaginal Bleeding with Endovaginal Sonography and Sonohysterography. AJR 2008; 169:I50-38)   Electronically Signed   By: Lavonia Dana M.D.   On:  04/14/2019 15:31   Result History    Patient given informed consent, signed copy in the chart, time out was performed. Appropriate time out taken. . The patient was placed in the lithotomy position and the cervix brought into view with sterile speculum.  Portio of cervix cleansed x 2 with betadine swabs.  A tenaculum was placed in the anterior lip of the cervix.  The uterus was sounded for depth of 6 cm. A pipelle was introduced to into the uterus, suction created,  and an endometrial sample was obtained. Same sample was sent to pathology All equipment was removed and accounted for.  The patient tolerated the procedure well.    Patient given post procedure instructions. The patient will be informed of result. Possibility of further testing was discussed  Woodroe Mode, MD 05/05/2019

## 2019-05-05 NOTE — Progress Notes (Signed)
Pt is in the office for endo biopsy. Pt states that BP elevated today because she forgot to take meds.

## 2019-05-05 NOTE — Patient Instructions (Signed)
Postmenopausal Bleeding  Postmenopausal bleeding is any bleeding that occurs after menopause. Menopause is when a woman's period stops. Any type of bleeding after menopause should be checked by your doctor. Treatment will depend on the cause. Follow these instructions at home:  Pay attention to any changes in your symptoms.  Avoid using tampons and douches as told by your doctor.  Change your pads regularly.  Get regular pelvic exams and Pap tests.  Take iron pills as told by your doctor.  Take over-the-counter and prescription medicines only as told by your doctor.  Keep all follow-up visits as told by your doctor. This is important. Contact a doctor if:  Your bleeding lasts for more than 1 week.  You have pain in your belly (abdomen).  You have bleeding during or after sex.  You have bleeding that happens more often than every 3 weeks. Get help right away if:  You have fever, chills, headache, dizziness, muscle aches, or bleeding.  You have very bad pain with bleeding.  You have clumps of blood (blood clots) coming from your vagina.  You have a lot of bleeding, and: ? You use more than 1 pad an hour. ? This kind of bleeding has never happened before.  You feel like you are going to pass out (faint). Summary  Any type of bleeding after menopause should be checked by your doctor.  Pay attention to any changes in your symptoms.  Keep all follow-up visits as told by your doctor. This information is not intended to replace advice given to you by your health care provider. Make sure you discuss any questions you have with your health care provider. Document Released: 07/16/2008 Document Revised: 12/24/2018 Document Reviewed: 11/12/2016 Elsevier Patient Education  2020 [employer]  

## 2019-05-12 ENCOUNTER — Telehealth: Payer: Self-pay

## 2019-05-12 NOTE — Telephone Encounter (Signed)
Contacted pt and advised of results and need for f/u appt. Notified scheduler

## 2019-05-26 ENCOUNTER — Other Ambulatory Visit: Payer: Self-pay

## 2019-05-26 ENCOUNTER — Ambulatory Visit: Payer: Medicare Other | Admitting: Obstetrics & Gynecology

## 2019-05-26 ENCOUNTER — Encounter: Payer: Self-pay | Admitting: Obstetrics & Gynecology

## 2019-05-26 VITALS — BP 117/80 | HR 66 | Wt 161.0 lb

## 2019-05-26 DIAGNOSIS — N950 Postmenopausal bleeding: Secondary | ICD-10-CM | POA: Diagnosis not present

## 2019-05-26 NOTE — Patient Instructions (Signed)
Hysteroscopy Hysteroscopy is a procedure that is used to examine the inside of a woman's womb (uterus). This may be done for various reasons, including:  To look for lumps (tumors) and other growths in the uterus.  To evaluate abnormal bleeding, fibroid tumors, polyps, scar tissue (adhesions), or cancer of the uterus.  To determine the cause of an inability to get pregnant (infertility) or repeated losses of pregnancies (miscarriages).  To find a lost IUD (intrauterine device).  To perform a procedure that permanently prevents pregnancy (sterilization). During this procedure, a thin, flexible tube with a small light and camera (hysteroscope) is used to examine the uterus. The camera sends images to a monitor in the room so that your health care provider can view the inside of your uterus. A hysteroscopy should be done right after a menstrual period to make sure that you are not pregnant. Tell a health care provider about:  Any allergies you have.  All medicines you are taking, including vitamins, herbs, eye drops, creams, and over-the-counter medicines.  Any problems you or family members have had with the use of anesthetic medicines.  Any blood disorders you have.  Any surgeries you have had.  Any medical conditions you have.  Whether you are pregnant or may be pregnant. What are the risks? Generally, this is a safe procedure. However, problems may occur, including:  Excessive bleeding.  Infection.  Damage to the uterus or other structures or organs.  Allergic reaction to medicines or fluids that are used in the procedure. What happens before the procedure? Staying hydrated Follow instructions from your health care provider about hydration, which may include:  Up to 2 hours before the procedure - you may continue to drink clear liquids, such as water, clear fruit juice, black coffee, and plain tea. Eating and drinking restrictions Follow instructions from your health care  provider about eating and drinking, which may include:  8 hours before the procedure - stop eating solid foods and drink clear liquids only  2 hours before the procedure - stop drinking clear liquids. General instructions  Ask your health care provider about: ? Changing or stopping your normal medicines. This is important if you take diabetes medicines or blood thinners. ? Taking medicines such as aspirin and ibuprofen. These medicines can thin your blood and cause bleeding. Do not take these medicines for 1 week before your procedure, or as told by your health care provider.  Do not use any products that contain nicotine or tobacco for 2 weeks before the procedure. This includes cigarettes and e-cigarettes. If you need help quitting, ask your health care provider.  Medicine may be placed in your cervix the day before the procedure. This medicine causes the cervix to have a larger opening (dilate). The larger opening makes it easier for the hysteroscope to be inserted into the uterus during the procedure.  Plan to have someone with you for the first 24-48 hours after the procedure, especially if you are given a medicine to make you fall asleep (general anesthetic).  Plan to have someone take you home from the hospital or clinic. What happens during the procedure?  To lower your risk of infection: ? Your health care team will wash or sanitize their hands. ? Your skin will be washed with soap. ? Hair may be removed from the surgical area.  An IV tube will be inserted into one of your veins.  You may be given one or more of the following: ? A medicine to help  you relax (sedative). ? A medicine that numbs the area around the cervix (local anesthetic). ? A medicine to make you fall asleep (general anesthetic).  A hysteroscope will be inserted through your vagina and into your uterus.  Air or fluid will be used to enlarge your uterus, enabling your health care provider to see your uterus  better. The amount of fluid used will be carefully checked throughout the procedure.  In some cases, tissue may be gently scraped from inside the uterus and sent to a lab for testing (biopsy). The procedure may vary among health care providers and hospitals. What happens after the procedure?  Your blood pressure, heart rate, breathing rate, and blood oxygen level will be monitored until the medicines you were given have worn off.  You may have some cramping. You may be given medicines for this.  You may have bleeding, which varies from light spotting to menstrual-like bleeding. This is normal.  If you had a biopsy done, it is your responsibility to get the results of your procedure. Ask your health care provider, or the department performing the procedure, when your results will be ready. Summary  Hysteroscopy is a procedure that is used to examine the inside of a woman's womb (uterus).  After the procedure, you may have bleeding, which varies from light spotting to menstrual-like bleeding. This is normal. You may also have cramping.  Plan to have someone take you home from the hospital or clinic. This information is not intended to replace advice given to you by your health care provider. Make sure you discuss any questions you have with your health care provider. Document Released: 01/13/2001 Document Revised: 09/19/2017 Document Reviewed: 11/05/2016 Elsevier Patient Education  2020 [employer].

## 2019-05-26 NOTE — Progress Notes (Signed)
Cc: RGYN pt here for F/U visit today regarding endometrial bx results  pt has no other concerns today.  [id number] No LMP recorded. Patient is postmenopausal. S/p endometrial biopsy for postmenopausal bleeding, result was benign but tissue was scant. She still has some bleeding. I recommend dx hysteroscopy and curettage. Patient desires surgical management   The risks of surgery were discussed in detail with the patient including but not limited to: bleeding which may require transfusion or reoperation; infection which may require prolonged hospitalization or re-hospitalization and antibiotic therapy; injury to bowel, bladder, ureters and major vessels or other surrounding organs; need for additional procedures including laparotomy; thromboembolic phenomenon, incisional problems and other postoperative or anesthesia complications.  Patient was told that the likelihood that her condition and symptoms will be treated effectively with this surgical management was very high; the postoperative expectations were also discussed in detail. The patient also understands the alternative treatment options which were discussed in full. All questions were answered.  She was told that she will be contacted by our surgical scheduler regarding the time and date of her surgery; routine preoperative instructions will be given to her by the preoperative nursing team.   She is aware of need for preoperative COVID testing and subsequent quarantine from time of test to time of surgery; she will be given further preoperative instructions at that [place] screening visit. Printed patient education handouts about the procedure were given to the patient to review at home.  Woodroe Mode, MD 05/26/2019

## 2019-05-28 ENCOUNTER — Other Ambulatory Visit: Payer: Self-pay | Admitting: Obstetrics & Gynecology

## 2019-05-28 NOTE — Progress Notes (Signed)
Orders for surgery 

## 2019-05-29 ENCOUNTER — Other Ambulatory Visit (HOSPITAL_COMMUNITY)
Admission: RE | Admit: 2019-05-29 | Discharge: 2019-05-29 | Disposition: A | Discharge status: Home / Self Care | Payer: Medicare Other | Source: Ambulatory Visit | Attending: Obstetrics & Gynecology | Admitting: Obstetrics & Gynecology

## 2019-05-29 DIAGNOSIS — Z20828 Contact with and (suspected) exposure to other viral communicable diseases: Secondary | ICD-10-CM | POA: Diagnosis not present

## 2019-05-29 DIAGNOSIS — Z01812 Encounter for preprocedural laboratory examination: Secondary | ICD-10-CM | POA: Insufficient documentation

## 2019-05-29 LAB — SARS CORONAVIRUS 2 (TAT 6-24 HRS): SARS Coronavirus 2: NEGATIVE

## 2019-05-31 ENCOUNTER — Other Ambulatory Visit: Payer: Self-pay

## 2019-05-31 ENCOUNTER — Encounter (HOSPITAL_COMMUNITY): Payer: Self-pay | Admitting: *Deleted

## 2019-05-31 NOTE — Anesthesia Preprocedure Evaluation (Addendum)
Anesthesia Evaluation  Patient identified by MRN, date of birth, ID band Patient awake    Reviewed: Allergy & Precautions, H&P , NPO status , Patient's Chart, lab work & pertinent test results, reviewed documented beta blocker date and time   Airway Mallampati: II  TM Distance: <3 FB Neck ROM: full    Dental no notable dental hx. (+) Missing, Poor Dentition   Pulmonary neg pulmonary ROS, former smoker,    breath sounds clear to auscultation       Cardiovascular hypertension, Pt. on medications  Rhythm:regular Rate:Normal     Neuro/Psych negative neurological ROS     GI/Hepatic Neg liver ROS, GERD  ,  Endo/Other    Renal/GU negative Renal ROS  negative genitourinary   Musculoskeletal  (+) Arthritis ,   Abdominal   Peds  Hematology  (+) anemia ,   Anesthesia Other Findings   Reproductive/Obstetrics                           Anesthesia Physical  Anesthesia Plan  ASA: II  Anesthesia Plan: General   Post-op Pain Management:    Induction: Intravenous  PONV Risk Score and Plan: 4 or greater and Ondansetron, Dexamethasone and Treatment may vary due to age or medical condition  Airway Management Planned: LMA  Additional Equipment:   Intra-op Plan:   Post-operative Plan: Extubation in OR  Informed Consent: I have reviewed the patients History and Physical, chart, labs and discussed the procedure including the risks, benefits and alternatives for the proposed anesthesia with the patient or authorized representative who has indicated his/her understanding and acceptance.     Dental advisory given  Plan Discussed with: CRNA  Anesthesia Plan Comments:         Anesthesia Quick Evaluation

## 2019-05-31 NOTE — Progress Notes (Addendum)
Patient denies shortness of breath, fever, cough and chest pain.  PCP - Marella Bile, PA-C , [facility] Cardiologist - Denies Oncology - Dr Truitt Merle  Chest x-ray - Denies EKG -  DOS 06/01/19 Stress Test - Denies ECHO - Denies Cardiac Cath - Denies  ERAS: Clears til 4:30 am, no drink  Anesthesia review: No  STOP now taking any Aspirin (unless otherwise instructed by your surgeon), Aleve, Naproxen, Ibuprofen, Motrin, Advil, Goody's, BC's, all herbal medications, fish oil, and all vitamins.   Coronavirus Screening Have you or your Sister experienced the following symptoms:  Cough yes/no: No Fever (>100.6F)  yes/no: No Runny nose yes/no: No Sore throat yes/no: No Difficulty breathing/shortness of breath  yes/no: No  Have you or your sister traveled in the last 14 days and where? yes/no: No

## 2019-06-01 ENCOUNTER — Encounter (HOSPITAL_COMMUNITY)
Admission: RE | Disposition: A | Discharge status: Home / Self Care | Payer: Self-pay | Source: Ambulatory Visit | Attending: Obstetrics & Gynecology

## 2019-06-01 ENCOUNTER — Ambulatory Visit (HOSPITAL_COMMUNITY): Payer: Medicare Other | Admitting: Anesthesiology

## 2019-06-01 ENCOUNTER — Ambulatory Visit (HOSPITAL_COMMUNITY)
Admission: RE | Admit: 2019-06-01 | Discharge: 2019-06-01 | Disposition: A | Discharge status: Home / Self Care | Payer: Medicare Other | Source: Ambulatory Visit | Attending: Obstetrics & Gynecology | Admitting: Obstetrics & Gynecology

## 2019-06-01 ENCOUNTER — Encounter (HOSPITAL_COMMUNITY): Payer: Self-pay | Admitting: Surgery

## 2019-06-01 DIAGNOSIS — N840 Polyp of corpus uteri: Secondary | ICD-10-CM

## 2019-06-01 DIAGNOSIS — Z79899 Other long term (current) drug therapy: Secondary | ICD-10-CM | POA: Insufficient documentation

## 2019-06-01 DIAGNOSIS — N950 Postmenopausal bleeding: Secondary | ICD-10-CM | POA: Diagnosis not present

## 2019-06-01 DIAGNOSIS — Z87891 Personal history of nicotine dependence: Secondary | ICD-10-CM | POA: Insufficient documentation

## 2019-06-01 DIAGNOSIS — K219 Gastro-esophageal reflux disease without esophagitis: Secondary | ICD-10-CM | POA: Insufficient documentation

## 2019-06-01 DIAGNOSIS — Z853 Personal history of malignant neoplasm of breast: Secondary | ICD-10-CM | POA: Diagnosis not present

## 2019-06-01 DIAGNOSIS — I10 Essential (primary) hypertension: Secondary | ICD-10-CM | POA: Insufficient documentation

## 2019-06-01 HISTORY — PX: HYSTEROSCOPY WITH D & C: SHX1775

## 2019-06-01 LAB — BASIC METABOLIC PANEL
Anion gap: 10 (ref 5–15)
BUN: 22 mg/dL (ref 8–23)
CO2: 22 mmol/L (ref 22–32)
Calcium: 9.2 mg/dL (ref 8.9–10.3)
Chloride: 107 mmol/L (ref 98–111)
Creatinine, Ser: 1.03 mg/dL — ABNORMAL HIGH (ref 0.44–1.00)
GFR calc Af Amer: >60 mL/min (ref >60)
GFR calc non Af Amer: 54 mL/min — ABNORMAL LOW (ref >60)
Glucose, Bld: 133 mg/dL — ABNORMAL HIGH (ref 70–99)
Potassium: 3.8 mmol/L (ref 3.5–5.1)
Sodium: 139 mmol/L (ref 135–145)

## 2019-06-01 LAB — CBC
HCT: 43.3 % (ref 36.0–46.0)
Hemoglobin: 14.1 g/dL (ref 12.0–15.0)
MCH: 28.4 pg (ref 26.0–34.0)
MCHC: 32.6 g/dL (ref 30.0–36.0)
MCV: 87.3 fL (ref 80.0–100.0)
Platelets: 301 10*3/uL (ref 150–400)
RBC: 4.96 MIL/uL (ref 3.87–5.11)
RDW: 14.1 % (ref 11.5–15.5)
WBC: 6.5 10*3/uL (ref 4.0–10.5)
nRBC: 0 % (ref 0.0–0.2)

## 2019-06-01 LAB — TYPE AND SCREEN
ABO/RH(D): A POS
Antibody Screen: NEGATIVE

## 2019-06-01 LAB — ABO/RH: ABO/RH(D): A POS

## 2019-06-01 SURGERY — DILATATION AND CURETTAGE /HYSTEROSCOPY
Anesthesia: General | Site: Uterus

## 2019-06-01 MED ORDER — FENTANYL CITRATE (PF) 250 MCG/5ML IJ SOLN
INTRAMUSCULAR | Status: AC
  Filled 2019-06-01: qty 5

## 2019-06-01 MED ORDER — LACTATED RINGERS IV SOLN
INTRAVENOUS | Status: DC
  Administered 2019-06-01: 07:00:00 via INTRAVENOUS

## 2019-06-01 MED ORDER — LIDOCAINE 2% (20 MG/ML) 5 ML SYRINGE
INTRAMUSCULAR | Status: AC
  Filled 2019-06-01: qty 5

## 2019-06-01 MED ORDER — ONDANSETRON HCL 4 MG/2ML IJ SOLN
INTRAMUSCULAR | Status: DC | PRN
  Administered 2019-06-01: 4 mg via INTRAVENOUS

## 2019-06-01 MED ORDER — TRAMADOL HCL 50 MG PO TABS: 50.0000 mg | ORAL_TABLET | Freq: Four times a day (QID) | ORAL | 0 refills | Status: DC | PRN

## 2019-06-01 MED ORDER — DEXAMETHASONE SODIUM PHOSPHATE 10 MG/ML IJ SOLN
INTRAMUSCULAR | Status: AC
  Filled 2019-06-01: qty 1

## 2019-06-01 MED ORDER — PROPOFOL 10 MG/ML IV BOLUS
INTRAVENOUS | Status: AC
  Filled 2019-06-01: qty 20

## 2019-06-01 MED ORDER — ONDANSETRON HCL 4 MG/2ML IJ SOLN
INTRAMUSCULAR | Status: AC
  Filled 2019-06-01: qty 2

## 2019-06-01 MED ORDER — SODIUM CHLORIDE 0.9 % IR SOLN
Status: DC | PRN
  Administered 2019-06-01: 3000 mL

## 2019-06-01 MED ORDER — DEXAMETHASONE SODIUM PHOSPHATE 10 MG/ML IJ SOLN
INTRAMUSCULAR | Status: DC | PRN
  Administered 2019-06-01: 5 mg via INTRAVENOUS

## 2019-06-01 MED ORDER — PHENYLEPHRINE 40 MCG/ML (10ML) SYRINGE FOR IV PUSH (FOR BLOOD PRESSURE SUPPORT)
PREFILLED_SYRINGE | INTRAVENOUS | Status: DC | PRN
  Administered 2019-06-01 (×5): 80 ug via INTRAVENOUS

## 2019-06-01 MED ORDER — PROPOFOL 10 MG/ML IV BOLUS
INTRAVENOUS | Status: DC | PRN
  Administered 2019-06-01: 120 mg via INTRAVENOUS

## 2019-06-01 MED ORDER — FENTANYL CITRATE (PF) 250 MCG/5ML IJ SOLN
INTRAMUSCULAR | Status: DC | PRN
  Administered 2019-06-01: 75 ug via INTRAVENOUS
  Administered 2019-06-01: 25 ug via INTRAVENOUS

## 2019-06-01 MED ORDER — PHENYLEPHRINE 40 MCG/ML (10ML) SYRINGE FOR IV PUSH (FOR BLOOD PRESSURE SUPPORT)
PREFILLED_SYRINGE | INTRAVENOUS | Status: AC
  Filled 2019-06-01: qty 10

## 2019-06-01 MED ORDER — BUPIVACAINE HCL (PF) 0.5 % IJ SOLN
INTRAMUSCULAR | Status: DC | PRN
  Administered 2019-06-01: 4 mL

## 2019-06-01 MED ORDER — KETOROLAC TROMETHAMINE 30 MG/ML IJ SOLN
INTRAMUSCULAR | Status: DC | PRN
  Administered 2019-06-01: 15 mg via INTRAVENOUS

## 2019-06-01 MED ORDER — BUPIVACAINE HCL (PF) 0.5 % IJ SOLN
INTRAMUSCULAR | Status: AC
  Filled 2019-06-01: qty 30

## 2019-06-01 MED ORDER — KETOROLAC TROMETHAMINE 30 MG/ML IJ SOLN
INTRAMUSCULAR | Status: AC
  Filled 2019-06-01: qty 1

## 2019-06-01 MED ORDER — LIDOCAINE HCL (CARDIAC) PF 100 MG/5ML IV SOSY
PREFILLED_SYRINGE | INTRAVENOUS | Status: DC | PRN
  Administered 2019-06-01: 70 mg via INTRAVENOUS

## 2019-06-01 SURGICAL SUPPLY — 16 items
CATH ROBINSON RED A/P 16FR (CATHETERS) ×3 IMPLANT
DEVICE MYOSURE LITE (MISCELLANEOUS) ×2 IMPLANT
ELECT REM PT RETURN 9FT ADLT (ELECTROSURGICAL) ×3
ELECTRODE REM PT RTRN 9FT ADLT (ELECTROSURGICAL) ×1 IMPLANT
GLOVE BIO SURGEON STRL SZ 6.5 (GLOVE) ×2 IMPLANT
GLOVE BIO SURGEONS STRL SZ 6.5 (GLOVE) ×1
GLOVE BIOGEL PI IND STRL 7.0 (GLOVE) ×2 IMPLANT
GLOVE BIOGEL PI INDICATOR 7.0 (GLOVE) ×4
GOWN STRL REUS W/ TWL LRG LVL3 (GOWN DISPOSABLE) ×2 IMPLANT
GOWN STRL REUS W/TWL LRG LVL3 (GOWN DISPOSABLE) ×6
KIT PROCEDURE FLUENT (KITS) ×3 IMPLANT
KIT TURNOVER KIT B (KITS) ×3 IMPLANT
PACK VAGINAL MINOR WOMEN LF (CUSTOM PROCEDURE TRAY) ×3 IMPLANT
PAD OB MATERNITY 4.3X12.25 (PERSONAL CARE ITEMS) ×3 IMPLANT
SEAL ROD LENS SCOPE MYOSURE (ABLATOR) ×2 IMPLANT
TOWEL GREEN STERILE FF (TOWEL DISPOSABLE) ×6 IMPLANT

## 2019-06-01 NOTE — H&P (Signed)
Chief Complaint  Patient presents with  . New Patient (Initial Visit)  postmenopausal bleeding  HPI Kendra Schultz is a 73 y.o. female.  [id number] LMP approximately age 24. She had bilateral mastectomy 2012 and she took tamoxifen for about 5 years. No oncology f/u now. Her PCP referred her to evaluate vaginal bleeding that started in May and still comes and goes. Occasionally heavy, just spotting recently. Scheduled for hysteroscopy and curettage today HPI      Past Medical History:  Diagnosis Date  . Anemia   . Arthritis   . Breast cancer ([place]) 09/06/2013  . Cancer ([place]) 1995, 2012   breast bilateral   . Cataract   . GERD (gastroesophageal reflux disease)   . Hypertension   . Osteoporosis     Past Surgical History:  Procedure Laterality Date  . APPENDECTOMY    . BREAST LUMPECTOMY  1997   right  . CATARACT EXTRACTION, BILATERAL    . ESOPHAGOGASTRODUODENOSCOPY    . MASTECTOMY  1995   left  . MASTECTOMY W/ SENTINEL NODE BIOPSY  09/17/2011   Procedure: MASTECTOMY WITH SENTINEL LYMPH NODE BIOPSY;  Surgeon: Joyice Faster. Cornett, MD;  Location: [facility];  Service: General;  Laterality: Right;  nuclear medicine injection 30 minutes prior for surgery   . OVARIAN CYST REMOVAL    . TONSILLECTOMY           Family History  Problem Relation Age of Onset  . Heart disease Father   . Cancer Father        lymphoma  . Heart disease Brother     Social History Social History        Tobacco Use  . Smoking status: Former Smoker    Packs/day: 1.00    Types: Cigarettes    Quit date: 11/22/2011    Years since quitting: 7.3  . Smokeless tobacco: Never Used  Substance Use Topics  . Alcohol use: No    Comment: occasionally  . Drug use: No        Allergies  Allergen Reactions  . Cephalexin Itching and Nausea And Vomiting          Current Outpatient Medications  Medication Sig Dispense Refill  . albuterol (PROVENTIL HFA;VENTOLIN HFA) 108  (90 BASE) MCG/ACT inhaler Inhale 2 puffs into the lungs every 6 (six) hours as needed for wheezing. Reported on 03/20/2016    . benazepril-hydrochlorthiazide (LOTENSIN HCT) 20-25 MG per tablet Take 1 tablet by mouth daily.    Marland Kitchen CALCIUM PO Take 1,200 mg by mouth daily.    . Cholecalciferol (VITAMIN D PO) Take 2,000 Units by mouth daily.     . Cyclobenzaprine HCl (FLEXERIL PO) Take 1 tablet by mouth daily as needed.    Marland Kitchen ibuprofen (ADVIL,MOTRIN) 200 MG tablet Take 400-600 mg by mouth every 6 (six) hours as needed. For pain     . pantoprazole (PROTONIX) 40 MG tablet Take 1 tablet (40 mg total) by mouth daily before breakfast. 90 tablet 3  . famotidine (PEPCID) 10 MG tablet Take 10 mg by mouth 2 (two) times daily as needed.     . tamoxifen (NOLVADEX) 20 MG tablet Take 1 tablet (20 mg total) by mouth daily. (Patient not taking: Reported on 04/07/2019) 90 tablet 3   No current facility-administered medications for this visit.     Review of Systems Review of Systems  Constitutional: Negative.   Respiratory: Positive for cough.   Gastrointestinal: Negative.   Genitourinary: Positive for frequency, vaginal bleeding and vaginal  discharge. Negative for pelvic pain and vaginal pain.    Blood pressure 137/79, pulse 70, temperature 99.1 F (37.3 C), resp. rate 20, height 5\' 1"  (1.549 m), weight 73 kg, SpO2 94 %.  Physical Exam Physical Exam from history Constitutional:      Appearance: Normal appearance.  Cardiovascular:     Rate and Rhythm: Normal rate.  Pulmonary:     Effort: Pulmonary effort is normal.  Abdominal:     General: Abdomen is flat.     Palpations: Abdomen is soft.  Genitourinary:    General: Normal vulva.     Vagina: Vaginal discharge (very small amount, no blood) present.     Comments: Cervix no lesion, no pelvic mass. Minimal tenderness Skin:    General: Skin is warm and dry.  Neurological:     General: No focal deficit present.     Mental Status: She is  alert.  Psychiatric:        Mood and Affect: Mood normal.        Behavior: Behavior normal.    CBC    Component Value Date/Time   WBC 6.5 06/01/2019 0604   RBC 4.96 06/01/2019 0604   HGB 14.1 06/01/2019 0604   HGB 12.6 05/05/2017 1053   HCT 43.3 06/01/2019 0604   HCT 38.0 05/05/2017 1053   PLT 301 06/01/2019 0604   PLT 204 05/05/2017 1053   MCV 87.3 06/01/2019 0604   MCV 89.6 05/05/2017 1053   MCH 28.4 06/01/2019 0604   MCHC 32.6 06/01/2019 0604   RDW 14.1 06/01/2019 0604   RDW 13.3 05/05/2017 1053   LYMPHSABS 2.2 05/05/2017 1053   MONOABS 0.5 05/05/2017 1053   EOSABS 0.2 05/05/2017 1053   BASOSABS 0.0 05/05/2017 1053     Data Reviewed CLINICAL DATA: History of right breast carcinoma with mastectomy 3 years ago, remote history of left breast carcinoma, ascites  EXAM: CT ABDOMEN AND PELVIS WITH CONTRAST  TECHNIQUE: Multidetector CT imaging of the abdomen and pelvis was performed using the standard protocol following bolus administration of intravenous contrast.  CONTRAST: 150mL OMNIPAQUE IOHEXOL 300 MG/ML SOLN  BUN and creatinine were obtained on site at [facility] at [address]Results: BUN 16 mg/dL, Creatinine 0.9 mg/dL.  COMPARISON: None.  FINDINGS: The lung bases are clear. There is some thickening of the mucosa of the distal esophagus of uncertain significance possibly due to esophagitis. Correlate clinically. The liver is low in attenuation diffusely most consistent with fatty infiltration. No focal hepatic abnormality is seen. Several gallstones are noted within the gallbladder. The gallbladder wall is not thickened. The pancreas is normal in size and the pancreatic duct is not dilated. The right adrenal gland is unremarkable, and there is a 9 mm incidental left adrenal adenomas present the spleen is normal in size. The kidneys enhance with no calculus or mass, and no hydronephrosis is seen. The abdominal aorta is normal  in caliber. No adenopathy is seen, with small retroperitoneal nodes present.  The urinary bladder is not well distended but no abnormality is noted. The uterus is normal in size. There is some nodularity to the fundus and small uterine fibroid is a definite consideration. No adnexal lesion is seen. There is no fluid noted within the pelvis. Multiple rectosigmoid colonic diverticula are present with changes of diverticulosis. Diverticula also are present throughout the descending colon. The terminal ileum is unremarkable and the appendix is not definitely seen. The lumbar vertebrae are in normal alignment.  IMPRESSION: 1. No ascites is  seen. There is no evidence of metastatic involvement of the abdomen and pelvis. 2. Gallstones. 3. Low-attenuation of the liver consistent with fatty infiltration. 4. Somewhat thickened mucosa of the distal esophagus. Question esophagitis. Correlate clinically. 5. Probable small uterine fundal fibroid. 6. Multiple rectosigmoid and descending colon.   Electronically Signed By: Ivar Drape M.D. On: 08/18/2014 15:13 Assessment Postmenopausal bleeding H/o breast cancer, exposure to tamoxifen, no longer taking Uterine fibroid seen on CT 2015, I reviewed images Benign endometrial biopsy but scant tissue, recommend D&C Plan She still has some bleeding. I recommend dx hysteroscopy and curettage. Patient desires surgical management   The risks of surgery were discussed in detail with the patient including but not limited to: bleeding which may require transfusion or reoperation; infection which may require prolonged hospitalization or re-hospitalization and antibiotic therapy; injury to bowel, bladder, ureters and major vessels or other surrounding organs; need for additional procedures including laparotomy; thromboembolic phenomenon, incisional problems and other postoperative or anesthesia complications.  Patient was told that the likelihood that her  condition and symptoms will be treated effectively with this surgical management was very high; the postoperative expectations were also discussed in detail. The patient also understands the alternative treatment options which were discussed in full. All questions were answered.       Woodroe Mode, MD 06/01/2019 7:18 AM

## 2019-06-01 NOTE — Anesthesia Procedure Notes (Signed)
Procedure Name: LMA Insertion Date/Time: 06/01/2019 7:29 AM Performed by: Raenette Rover, CRNA Pre-anesthesia Checklist: Patient identified, Emergency Drugs available, Suction available and Patient being monitored Patient Re-evaluated:Patient Re-evaluated prior to induction Oxygen Delivery Method: Circle system utilized Preoxygenation: Pre-oxygenation with 100% oxygen Induction Type: IV induction LMA: LMA inserted LMA Size: 4.0 Number of attempts: 1 Placement Confirmation: positive ETCO2,  CO2 detector and breath sounds checked- equal and bilateral Tube secured with: Tape Dental Injury: Teeth and Oropharynx as per pre-operative assessment

## 2019-06-01 NOTE — Discharge Instructions (Signed)
Hysteroscopy, Care After This sheet gives you information about how to care for yourself after your procedure. Your health care provider may also give you more specific instructions. If you have problems or questions, contact your health care provider. What can I expect after the procedure? After the procedure, it is common to have:  Cramping.  Bleeding. This can vary from light spotting to menstrual-like bleeding. Follow these instructions at home: Activity  Rest for 1-2 days after the procedure.  Do not douche, use tampons, or have sex for 2 weeks after the procedure, or until your health care provider approves.  Do not drive for 24 hours after the procedure, or for as long as told by your health care provider.  Do not drive, use heavy machinery, or drink alcohol while taking prescription pain medicines. Medicines   Take over-the-counter and prescription medicines only as told by your health care provider.  Do not take aspirin during recovery. It can increase the risk of bleeding. General instructions  Do not take baths, swim, or use a hot tub until your health care provider approves. Take showers instead of baths for 2 weeks, or for as long as told by your health care provider.  To prevent or treat constipation while you are taking prescription pain medicine, your health care provider may recommend that you: ? Drink enough fluid to keep your urine clear or pale yellow. ? Take over-the-counter or prescription medicines. ? Eat foods that are high in fiber, such as fresh fruits and vegetables, whole grains, and beans. ? Limit foods that are high in fat and processed sugars, such as fried and sweet foods.  Keep all follow-up visits as told by your health care provider. This is important. Contact a health care provider if:  You feel dizzy or lightheaded.  You feel nauseous.  You have abnormal vaginal discharge.  You have a rash.  You have pain that does not get better with  medicine.  You have chills. Get help right away if:  You have bleeding that is heavier than a normal menstrual period.  You have a fever.  You have pain or cramps that get worse.  You develop new abdominal pain.  You faint.  You have pain in your shoulders.  You have shortness of breath. Summary  After the procedure, you may have cramping and some vaginal bleeding.  Do not douche, use tampons, or have sex for 2 weeks after the procedure, or until your health care provider approves.  Do not take baths, swim, or use a hot tub until your health care provider approves. Take showers instead of baths for 2 weeks, or for as long as told by your health care provider.  Report any unusual symptoms to your health care provider.  Keep all follow-up visits as told by your health care provider. This is important. This information is not intended to replace advice given to you by your health care provider. Make sure you discuss any questions you have with your health care provider. Document Released: 07/28/2013 Document Revised: 09/19/2017 Document Reviewed: 11/05/2016 Elsevier Patient Education  2020 [employer].

## 2019-06-01 NOTE — Anesthesia Postprocedure Evaluation (Signed)
Anesthesia Post Note  Patient: Kendra Schultz  Procedure(s) Performed: DILATATION AND CURETTAGE /HYSTEROSCOPY (N/A Uterus)     Patient location during evaluation: PACU Anesthesia Type: General Level of consciousness: sedated and patient cooperative Pain management: pain level controlled Vital Signs Assessment: post-procedure vital signs reviewed and stable Respiratory status: spontaneous breathing Cardiovascular status: stable Anesthetic complications: no    Last Vitals:  Vitals:   06/01/19 0859 06/01/19 0900  BP:  110/65  Pulse: (!) 49 (!) 47  Resp: 15 13  Temp:  36.6 C  SpO2: 100% 100%    Last Pain:  Vitals:   06/01/19 0817  PainSc: 0-No pain                 Nolon Nations

## 2019-06-01 NOTE — Op Note (Signed)
PREOPERATIVE DIAGNOSIS:  Postmenopausal bleeding POSTOPERATIVE DIAGNOSIS: Endometrial polyps PROCEDURE: Hysteroscopy, Dilation and Curettage resection of endometrial polyps SURGEON:  Dr. Emeterio Reeve   INDICATIONS: 73 y.o. [id number]  here for scheduled surgery for the aforementioned diagnoses.   Risks of surgery were discussed with the patient including but not limited to: bleeding which may require transfusion; infection which may require antibiotics; injury to uterus or surrounding organs; intrauterine scarring which may impair future fertility; need for additional procedures including laparotomy or laparoscopy; and other postoperative/anesthesia complications. Written informed consent was obtained.    FINDINGS:  A 4 week size uterus.  Endometrial polyps originating from the anterior wall.  Normal ostia bilaterally.  ANESTHESIA:   General, paracervical block with 10 ml of 0.5% Marcaine INTRAVENOUS FLUIDS:  800 ml of LR FLUID DEFICITS:  181ml of Glycine ESTIMATED BLOOD LOSS:  Less than 20 ml SPECIMENS: Endometrial curettings, resected polyp sent to pathology COMPLICATIONS:  None immediate.  PROCEDURE DETAILS:  The patient received intravenous antibiotics while in the preoperative area.  She was then taken to the operating room where general anesthesia was administered and was found to be adequate.  After an adequate timeout was performed, she was placed in the dorsal lithotomy position and examined; then prepped and draped in the sterile manner.   Her bladder was catheterized for an unmeasured amount of clear, yellow urine. A speculum was then placed in the patient's vagina and a single tooth tenaculum was applied to the anterior lip of the cervix.   A paracervical block using 10 ml of 0.5% Marcaine was administered.  The cervix was sounded to 9 cm and dilated manually with metal dilators to accommodate the 5 mm diagnostic hysteroscope.  Once the cervix was dilated, the hysteroscope was inserted  under direct visualization using salineas a suspension medium.  The uterine cavity was carefully examined with the findings as noted above. The hysteroscopy was replaced with the assembly for MyoSure resection and the endometrial polyps were removed  A sharp curettage was then performed to obtain a small amount of endometrial curettings.  The tenaculum was removed from the anterior lip of the cervix and the vaginal speculum was removed after noting good hemostasis.  The patient tolerated the procedure well and was taken to the recovery area awake, extubated and in stable condition.  The patient will be discharged to home as per PACU criteria.  Routine postoperative instructions given.  She was prescribed Percocet, Ibuprofen.  She will follow up in the clinic in 3 weeks  for postoperative evaluation.   Woodroe Mode, MD 06/01/2019 8:13 AM

## 2019-06-01 NOTE — Transfer of Care (Signed)
Immediate Anesthesia Transfer of Care Note  Patient: Kendra Schultz  Procedure(s) Performed: DILATATION AND CURETTAGE /HYSTEROSCOPY (N/A Uterus)  Patient Location: PACU  Anesthesia Type:General  Level of Consciousness: awake, alert , oriented, drowsy and patient cooperative  Airway & Oxygen Therapy: Patient Spontanous Breathing and Patient connected to nasal cannula oxygen  Post-op Assessment: Report given to RN and Post -op Vital signs reviewed and stable  Post vital signs: Reviewed and stable  Last Vitals:  Vitals Value Taken Time  BP    Temp    Pulse    Resp    SpO2      Last Pain:  Vitals:   06/01/19 0605  PainSc: 0-No pain      Patients Stated Pain Goal: 3 (28/76/81 1572)  Complications: No apparent anesthesia complications

## 2019-06-02 ENCOUNTER — Encounter (HOSPITAL_COMMUNITY): Payer: Self-pay | Admitting: Obstetrics & Gynecology

## 2020-02-25 ENCOUNTER — Encounter: Payer: Self-pay | Admitting: Physician Assistant

## 2020-02-25 ENCOUNTER — Other Ambulatory Visit: Payer: Self-pay

## 2020-02-25 ENCOUNTER — Ambulatory Visit (INDEPENDENT_AMBULATORY_CARE_PROVIDER_SITE_OTHER): Payer: Medicare Other

## 2020-02-25 ENCOUNTER — Ambulatory Visit (INDEPENDENT_AMBULATORY_CARE_PROVIDER_SITE_OTHER): Payer: Medicare Other | Admitting: Physician Assistant

## 2020-02-25 VITALS — BP 142/88 | HR 59 | Temp 98.2°F | Ht 61.0 in | Wt 158.4 lb

## 2020-02-25 DIAGNOSIS — L299 Pruritus, unspecified: Secondary | ICD-10-CM

## 2020-02-25 DIAGNOSIS — M810 Age-related osteoporosis without current pathological fracture: Secondary | ICD-10-CM | POA: Diagnosis not present

## 2020-02-25 DIAGNOSIS — M25551 Pain in right hip: Secondary | ICD-10-CM

## 2020-02-25 DIAGNOSIS — I10 Essential (primary) hypertension: Secondary | ICD-10-CM

## 2020-02-25 DIAGNOSIS — J45909 Unspecified asthma, uncomplicated: Secondary | ICD-10-CM | POA: Insufficient documentation

## 2020-02-25 DIAGNOSIS — Z1211 Encounter for screening for malignant neoplasm of colon: Secondary | ICD-10-CM

## 2020-02-25 DIAGNOSIS — J4520 Mild intermittent asthma, uncomplicated: Secondary | ICD-10-CM

## 2020-02-25 DIAGNOSIS — M1990 Unspecified osteoarthritis, unspecified site: Secondary | ICD-10-CM | POA: Insufficient documentation

## 2020-02-25 DIAGNOSIS — K2970 Gastritis, unspecified, without bleeding: Secondary | ICD-10-CM | POA: Insufficient documentation

## 2020-02-25 LAB — COMPREHENSIVE METABOLIC PANEL
ALT: 21 U/L (ref 0–35)
AST: 16 U/L (ref 0–37)
Albumin: 4.4 g/dL (ref 3.5–5.2)
Alkaline Phosphatase: 64 U/L (ref 39–117)
BUN: 19 mg/dL (ref 6–23)
CO2: 27 mEq/L (ref 19–32)
Calcium: 9.6 mg/dL (ref 8.4–10.5)
Chloride: 107 mEq/L (ref 96–112)
Creatinine, Ser: 0.81 mg/dL (ref 0.40–1.20)
GFR: 69.19 mL/min (ref >60.00)
Glucose, Bld: 97 mg/dL (ref 70–99)
Potassium: 3.8 mEq/L (ref 3.5–5.1)
Sodium: 139 mEq/L (ref 135–145)
Total Bilirubin: 0.5 mg/dL (ref 0.2–1.2)
Total Protein: 7 g/dL (ref 6.0–8.3)

## 2020-02-25 LAB — LIPID PANEL
Cholesterol: 214 mg/dL — ABNORMAL HIGH (ref 0–200)
HDL: 36.3 mg/dL — ABNORMAL LOW (ref >39.00)
NonHDL: 177.94
Total CHOL/HDL Ratio: 6
Triglycerides: 292 mg/dL — ABNORMAL HIGH (ref 0.0–149.0)
VLDL: 58.4 mg/dL — ABNORMAL HIGH (ref 0.0–40.0)

## 2020-02-25 LAB — LDL CHOLESTEROL, DIRECT: Direct LDL: 132 mg/dL

## 2020-02-25 MED ORDER — ALBUTEROL SULFATE HFA 108 (90 BASE) MCG/ACT IN AERS: 2.0000 | INHALATION_SPRAY | Freq: Four times a day (QID) | RESPIRATORY_TRACT | 2 refills | Status: DC | PRN

## 2020-02-25 MED ORDER — ACETIC ACID 2 % OT SOLN: 4.0000 [drp] | Freq: Every day | OTIC | 0 refills | Status: DC | PRN

## 2020-02-25 NOTE — Patient Instructions (Addendum)
It was great to see you!  1. Get hip xray here, we will be in touch with the results. We will also be in touch with your lab results.  2. Please schedule your dexa scan on the way out. This will update [place] on your osteoporosis so we can figure out the best treatment options for you.  3. Please call your insurance company and find out if and where you can get your pneumonia and tetanus vaccines.  4. You will be contacted about scheduling your colonoscopy.  I have sent in albuterol inhaler for your cough. I have sent in ear drops for your itchy ears.  Let's follow-up in 3 months, sooner if you have concerns.  Take care,  Inda Coke PA-C

## 2020-02-25 NOTE — Progress Notes (Signed)
Kendra Schultz is a 74 y.o. female is here to establish care.  I acted as a Education administrator for [employer], PA-C [employer] [facility], [place]  History of Present Illness:   Chief Complaint  Patient presents with  . New Patient (Initial Visit)  . Hip Pain    HPI   Pt is here to establish care. Previous PCP is [facility] but they no longer accept her insurance.  R hip pain Pt c/o right hip pain that radiates down to her buttocks. She takes Advil for pain which helps temporarily. Has been going on for months. Hurts with activity. Pain is not severe. She trialed flexeril but it causes mood disturbances. Denies numbness/tingling down leg or swelling. She does have history of arthritis and osteoporosis.  Osteoporosis Last DEXA scan June 2017 -- showed osteopenia. Chart reveals prior hx of osteoporosis however I do not have records confirming this. She does take vitamin D and calcium regularly. She denies hx of taking rx meds for osteoporosis.  HTN Currently taking Lotensin HCT 20-25 mg. At home blood pressure readings are: not checked. Patient denies chest pain, SOB, blurred vision, dizziness, unusual headaches, lower leg swelling. Patient is compliant with medication. Denies excessive caffeine intake, stimulant usage, excessive alcohol intake, or increase in salt consumption.  BP Readings from Last 3 Encounters:  02/25/20 (!) 142/88  06/01/19 110/65  05/26/19 117/80   Asthma Feels like she has had a cough with wheezing recently. Denies recent travel or history of COVID-19 contacts. Denies: fever, chills, chest pain, SOB. Chart review shows hx of asthma and bronchitis, treated with prn inhaler.  Itching ears Has itching inside of her ears with a bit of flaking. No pain/discharge/bleeding. Hasn't tried anything.   Health Maintenance Due  Topic Date Due  . COVID-19 Vaccine (1) Never done  . TETANUS/TDAP  Never done  . COLONOSCOPY  Never done  . PNA vac Low Risk Adult (1 of 2 -  PCV13) Never done  . MAMMOGRAM  08/26/2013    Past Medical History:  Diagnosis Date  . Anemia   . Arthritis   . Cataract    surgery to remove  . GERD (gastroesophageal reflux disease)   . Osteoporosis      Social History   Socioeconomic History  . Marital status: Single    Spouse name: Not on file  . Number of children: 2  . Years of education: Not on file  . Highest education level: Not on file  Occupational History  . Occupation: retired  Tobacco Use  . Smoking status: Former Smoker    Packs/day: 1.00    Types: Cigarettes    Quit date: 11/22/2011    Years since quitting: 8.2  . Smokeless tobacco: Never Used  Substance and Sexual Activity  . Alcohol use: Not Currently    Comment: occasional wine  . Drug use: No  . Sexual activity: Not Currently    Birth control/protection: Post-menopausal  Other Topics Concern  . Not on file  Social History Narrative   Used to work at [employer]   Two sons -- live with her   Divorced/widowed   Social Determinants of Radio broadcast assistant Strain:   . Difficulty of Paying Living Expenses:   Food Insecurity:   . Worried About Charity fundraiser in the Last Year:   . Arboriculturist in the Last Year:   Transportation Needs:   . Film/video editor (Medical):   Marland Kitchen Lack  of Transportation (Non-Medical):   Physical Activity:   . Days of Exercise per Week:   . Minutes of Exercise per Session:   Stress:   . Feeling of Stress :   Social Connections:   . Frequency of Communication with Friends and Family:   . Frequency of Social Gatherings with Friends and Family:   . Attends Religious Services:   . Active Member of Clubs or Organizations:   . Attends Archivist Meetings:   Marland Kitchen Marital Status:   Intimate Partner Violence:   . Fear of Current or Ex-Partner:   . Emotionally Abused:   Marland Kitchen Physically Abused:   . Sexually Abused:     Past Surgical History:  Procedure Laterality Date  . APPENDECTOMY    .  BREAST LUMPECTOMY  1997   right  . CATARACT EXTRACTION, BILATERAL    . ESOPHAGOGASTRODUODENOSCOPY    . HYSTEROSCOPY WITH D & C N/A 06/01/2019   Procedure: DILATATION AND CURETTAGE /HYSTEROSCOPY;  Surgeon: Woodroe Mode, MD;  Location: [facility];  Service: Gynecology;  Laterality: N/A;  . MASTECTOMY  1995   left  . MASTECTOMY W/ SENTINEL NODE BIOPSY  09/17/2011   Procedure: MASTECTOMY WITH SENTINEL LYMPH NODE BIOPSY;  Surgeon: Joyice Faster. Cornett, MD;  Location: [facility];  Service: General;  Laterality: Right;  nuclear medicine injection 30 minutes prior for surgery   . OVARIAN CYST REMOVAL    . TONSILLECTOMY      Family History  Problem Relation Age of Onset  . Heart disease Father   . Cancer Father        lymphoma  . Heart disease Brother   . Breast cancer Sister   . Breast cancer Sister     PMHx, SurgHx, SocialHx, FamHx, Medications, and Allergies were reviewed in the Visit Navigator and updated as appropriate.   Patient Active Problem List   Diagnosis Date Noted  . Benign essential hypertension 02/25/2020  . Asthma 02/25/2020  . Gastritis 02/25/2020  . Arthritis   . Postmenopausal bleeding 04/07/2019  . GERD (gastroesophageal reflux disease) 01/24/2017  . Osteoporosis 03/09/2014  . Cancer of right breast, stage 1 ([place]) 09/06/2013    Social History   Tobacco Use  . Smoking status: Former Smoker    Packs/day: 1.00    Types: Cigarettes    Quit date: 11/22/2011    Years since quitting: 8.2  . Smokeless tobacco: Never Used  Substance Use Topics  . Alcohol use: Not Currently    Comment: occasional wine  . Drug use: No    Current Medications and Allergies:    Current Outpatient Medications:  .  albuterol (VENTOLIN HFA) 108 (90 Base) MCG/ACT inhaler, Inhale 2 puffs into the lungs every 6 (six) hours as needed for wheezing. Reported on 03/20/2016, Disp: 18 g, Rfl: 2 .  benazepril-hydrochlorthiazide (LOTENSIN HCT) 20-25 MG per tablet, Take 1 tablet by mouth daily., Disp: ,  Rfl:  .  Calcium-Phosphorus-Vitamin D (CITRACAL +D3 PO), Take 2 tablets by mouth daily., Disp: , Rfl:  .  Cholecalciferol (VITAMIN D3) 50 MCG (2000 UT) TABS, Take 2,000 Units by mouth daily., Disp: , Rfl:  .  ibuprofen (ADVIL,MOTRIN) 200 MG tablet, Take 400 mg by mouth every 6 (six) hours as needed. For pain , Disp: , Rfl:  .  pantoprazole (PROTONIX) 40 MG tablet, Take 1 tablet (40 mg total) by mouth daily before breakfast., Disp: 90 tablet, Rfl: 3 .  acetic acid 2 % otic solution, Place 4 drops into both ears  daily as needed (itching)., Disp: 15 mL, Rfl: 0 .  omeprazole (PRILOSEC) 20 MG capsule, Take 20 mg by mouth daily., Disp: , Rfl:    Allergies  Allergen Reactions  . Cephalexin Itching and Nausea And Vomiting    Review of Systems   ROS Negative unless otherwise specified per HPI.  Vitals:   Vitals:   02/25/20 1030  BP: (!) 142/88  Pulse: (!) 59  Temp: 98.2 F (36.8 C)  TempSrc: Temporal  SpO2: 93%  Weight: 158 lb 6.4 oz (71.8 kg)  Height: 5\' 1"  (1.549 m)     Body mass index is 29.93 kg/m.   Physical Exam:    Physical Exam Vitals and nursing note reviewed.  Constitutional:      General: She is not in acute distress.    Appearance: She is well-developed. She is not ill-appearing or toxic-appearing.  HENT:     Right Ear: Tympanic membrane, ear canal and external ear normal.     Left Ear: Tympanic membrane, ear canal and external ear normal.  Cardiovascular:     Rate and Rhythm: Normal rate and regular rhythm.     Pulses: Normal pulses.     Heart sounds: Normal heart sounds, S1 normal and S2 normal.     Comments: No LE edema Pulmonary:     Effort: Pulmonary effort is normal.     Breath sounds: Normal breath sounds.  Musculoskeletal:     Comments: Pain to R hip with straight leg raise and resisted adduction. No decreased ROM. No swelling to lower legs.  Skin:    General: Skin is warm and dry.  Neurological:     Mental Status: She is alert.     GCS: GCS eye  subscore is 4. GCS verbal subscore is 5. GCS motor subscore is 6.  Psychiatric:        Speech: Speech normal.        Behavior: Behavior normal. Behavior is cooperative.      Assessment and Plan:    [place] was seen today for new patient (initial visit) and hip pain.  Diagnoses and all orders for this visit:  Right hip pain Possible bursitis, however will update xray given chronicity and hx of osteoporosis. Likely referral to PT if xray WNL. No red flags on exam. -     DG Hip Unilat W OR W/O Pelvis 2-3 Views Right; Future  Osteoporosis, unspecified osteoporosis type, unspecified pathological fracture presence Continue Vit D and calcium. Overdue for DEXA, ordered and counseled. -     DG Bone Density; Future  Benign essential hypertension Update CMP and check lipid panel today. Continue current regimen. Follow-up in 3 months, sooner if concerns. -     Comprehensive metabolic panel -     Lipid panel  Special screening for malignant neoplasms, colon -     Ambulatory referral to Gastroenterology  Mild intermittent asthma without complication No wheezing or red flags on exam. Sent albuterol inhaler prn. Refuses COVID-19 vaccine presently. Follow-up if worsening.  Itching of ear No red flags on exam, will trial acetic acid solution. Follow-up if worsening or lack of improvement.  Other orders -     albuterol (VENTOLIN HFA) 108 (90 Base) MCG/ACT inhaler; Inhale 2 puffs into the lungs every 6 (six) hours as needed for wheezing. Reported on 03/20/2016 -     acetic acid 2 % otic solution; Place 4 drops into both ears daily as needed (itching).   . Reviewed expectations re: course of current medical  issues. . Discussed self-management of symptoms. . Outlined signs and symptoms indicating need for more acute intervention. . Patient verbalized understanding and all questions were answered. . See orders for this visit as documented in the electronic medical record. . Patient received an  After Visit Summary.  CMA or LPN served as scribe during this visit. History, Physical, and Plan performed by medical provider. The above documentation has been reviewed and is accurate and complete.   Inda Coke, PA-C [place] [place] 02/25/2020  Follow-up: No follow-ups on file.

## 2020-03-02 ENCOUNTER — Telehealth: Payer: Self-pay | Admitting: Physician Assistant

## 2020-03-02 NOTE — Telephone Encounter (Signed)
Pt called requesting lab & x-ray results. Told pt CMA would give her a call back. Updated number in pt chart.

## 2020-03-03 MED ORDER — ATORVASTATIN CALCIUM 20 MG PO TABS
20.0000 mg | ORAL_TABLET | Freq: Every day | ORAL | 1 refills | Status: DC
Start: 2020-03-03 — End: 2020-09-01

## 2020-03-03 NOTE — Telephone Encounter (Signed)
Spoke to pt, told her per [facility] Hip xray negative -- I would like to pursue physical therapy. If she is agreeable, I can put the referral in. Pt verbalized understanding and said she will think about therapy. Told her that is fine if she decides she wants to do therapy just call back and we will put referral in. Also Cholesterol is quite elevated and she would benefit from a cholesterol medication to reduce her risk of having a stroke or heart attack. She would like to start you on lipitor 20 mg daily if you are agreeable. Pt verbalized understanding and said yes she would start medication. Told her okay I will send to pharmacy and  Follow-up in 6 months. Pt verbalized understanding. Rx sent.

## 2020-03-03 NOTE — Addendum Note (Signed)
Addended by: Marian Sorrow on: 03/03/2020 10:27 AM   Modules accepted: Orders

## 2020-03-03 NOTE — Telephone Encounter (Signed)
Hip xray negative -- I would like to pursue physical therapy. If she is agreeable, I can put the referral in.   Cholesterol is quite elevated and she would benefit from a cholesterol medication to reduce her risk of having a stroke or heart attack. (If patient is agreeable, send in lipitor 20 mg, 90 tabs with 1 refill.) Follow-up in 6 months.

## 2020-03-03 NOTE — Telephone Encounter (Signed)
Pt called for results. Please advise

## 2020-05-09 ENCOUNTER — Other Ambulatory Visit: Payer: Self-pay | Admitting: Physician Assistant

## 2020-05-18 ENCOUNTER — Telehealth: Payer: Self-pay | Admitting: Physician Assistant

## 2020-05-18 NOTE — Telephone Encounter (Signed)
NO ANSWER/NO VM ON EITHER PHONE.  Pt due to schedule Medicare Annual Wellness Visit (AWV) either virtually/audio only OR in office. Whatever the patients preference is.  No hx; please schedule at anytime with [facility]-Nurse Health Advisor at [facility].  This should be a 45 minute visit.

## 2020-06-02 ENCOUNTER — Encounter: Payer: Self-pay | Admitting: Genetic Counselor

## 2020-07-04 ENCOUNTER — Other Ambulatory Visit: Payer: Self-pay | Admitting: Physician Assistant

## 2020-08-25 ENCOUNTER — Other Ambulatory Visit: Payer: Self-pay

## 2020-08-25 MED ORDER — BENAZEPRIL-HYDROCHLOROTHIAZIDE 20-25 MG PO TABS: 1.0000 | ORAL_TABLET | Freq: Every day | ORAL | 2 refills | Status: DC

## 2020-08-25 NOTE — Telephone Encounter (Signed)
MEDICATION: benazepril-hydrochlorthiazide (LOTENSIN HCT) 20-25 MG per tablet   PHARMACY:  [facility] [place], [place] [address] Phone:  [phone]  Fax:  [phone]       Comments: Patient stated she is completely out. Patient is scheduled for appt on 09/01/20  **Let patient know to contact pharmacy at the end of the day to make sure medication is ready. **  ** Please notify patient to allow 48-72 hours to process**  **Encourage patient to contact the pharmacy for refills or they can request refills through [facility]**

## 2020-08-25 NOTE — Addendum Note (Signed)
Addended by: Marian Sorrow on: 08/25/2020 05:06 PM   Modules accepted: Orders

## 2020-08-25 NOTE — Telephone Encounter (Signed)
Pt notified Rx sent to pharmacy

## 2020-09-01 ENCOUNTER — Other Ambulatory Visit: Payer: Self-pay

## 2020-09-01 ENCOUNTER — Encounter: Payer: Self-pay | Admitting: Physician Assistant

## 2020-09-01 ENCOUNTER — Ambulatory Visit (INDEPENDENT_AMBULATORY_CARE_PROVIDER_SITE_OTHER): Payer: Medicare Other | Admitting: Physician Assistant

## 2020-09-01 VITALS — BP 126/80 | HR 75 | Temp 98.3°F | Ht 61.0 in | Wt 157.0 lb

## 2020-09-01 DIAGNOSIS — Z1211 Encounter for screening for malignant neoplasm of colon: Secondary | ICD-10-CM | POA: Diagnosis not present

## 2020-09-01 DIAGNOSIS — E785 Hyperlipidemia, unspecified: Secondary | ICD-10-CM | POA: Diagnosis not present

## 2020-09-01 DIAGNOSIS — Z23 Encounter for immunization: Secondary | ICD-10-CM | POA: Diagnosis not present

## 2020-09-01 DIAGNOSIS — M810 Age-related osteoporosis without current pathological fracture: Secondary | ICD-10-CM | POA: Diagnosis not present

## 2020-09-01 MED ORDER — OMEPRAZOLE 20 MG PO CPDR: 20.0000 mg | DELAYED_RELEASE_CAPSULE | Freq: Every day | ORAL | 3 refills | Status: DC

## 2020-09-01 MED ORDER — ATORVASTATIN CALCIUM 20 MG PO TABS: 20.0000 mg | ORAL_TABLET | Freq: Every day | ORAL | 1 refills | Status: DC

## 2020-09-01 NOTE — Addendum Note (Signed)
Addended by: Marian Sorrow on: 09/01/2020 10:08 AM   Modules accepted: Orders

## 2020-09-01 NOTE — Progress Notes (Signed)
Kendra Schultz is a 74 y.o. female is here to discuss:Cholesterol  I acted as a Education administrator for [employer], PA-C Kendra Pickler, LPN   History of Present Illness:   Chief Complaint  Patient presents with  . Hyperlipidemia    HPI   Hyperlipidemia Pt following up today, was started on Lipitor 20 mg last visit in May 2021. Pt tolerating, has some leg cramps off and on but had some prior to taking medication and they are no worse.  Screening colonoscopy Referral placed in May 2021. Per chart she did not return phone calls. Per patient she did not receive phone calls. She would like new referral.  Osteoporosis Last DEXA scan was in 2017. She is overdue for repeat DEXA. This order was placed in May 2021 but she has yet to schedule.  GERD Takes prilosec 20 mg daily. Tolerates this well. Needs refill. Denies breakthrough symptoms.   Health Maintenance Due  Topic Date Due  . COLONOSCOPY  Never done  . PNA vac Low Risk Adult (1 of 2 - PCV13) Never done  . MAMMOGRAM  08/26/2013    Past Medical History:  Diagnosis Date  . Anemia   . Arthritis   . Cataract    surgery to remove  . GERD (gastroesophageal reflux disease)   . Osteoporosis      Social History   Tobacco Use  . Smoking status: Former Smoker    Packs/day: 1.00    Types: Cigarettes    Quit date: 11/22/2011    Years since quitting: 8.7  . Smokeless tobacco: Never Used  Vaping Use  . Vaping Use: Never used  Substance Use Topics  . Alcohol use: Not Currently    Comment: occasional wine  . Drug use: No    Past Surgical History:  Procedure Laterality Date  . APPENDECTOMY    . BREAST LUMPECTOMY  1997   right  . CATARACT EXTRACTION, BILATERAL    . ESOPHAGOGASTRODUODENOSCOPY    . HYSTEROSCOPY WITH D & C N/A 06/01/2019   Procedure: DILATATION AND CURETTAGE /HYSTEROSCOPY;  Surgeon: Kendra Mode, MD;  Location: [place];  Service: Gynecology;  Laterality: N/A;  . MASTECTOMY  1995   left  . MASTECTOMY W/  SENTINEL NODE BIOPSY  09/17/2011   Procedure: MASTECTOMY WITH SENTINEL LYMPH NODE BIOPSY;  Surgeon: Kendra Faster. Cornett, MD;  Location: [facility];  Service: General;  Laterality: Right;  nuclear medicine injection 30 minutes prior for surgery   . OVARIAN CYST REMOVAL    . TONSILLECTOMY      Family History  Problem Relation Age of Onset  . Heart disease Father   . Cancer Father        lymphoma  . Heart disease Brother   . Breast cancer Sister   . Breast cancer Sister     PMHx, SurgHx, SocialHx, FamHx, Medications, and Allergies were reviewed in the Visit Navigator and updated as appropriate.   Patient Active Problem List   Diagnosis Date Noted  . Benign essential hypertension 02/25/2020  . Asthma 02/25/2020  . Gastritis 02/25/2020  . Arthritis   . Postmenopausal bleeding 04/07/2019  . GERD (gastroesophageal reflux disease) 01/24/2017  . Osteoporosis 03/09/2014  . Cancer of right breast, stage 1 ([place]) 09/06/2013    Social History   Tobacco Use  . Smoking status: Former Smoker    Packs/day: 1.00    Types: Cigarettes    Quit date: 11/22/2011    Years since quitting: 8.7  . Smokeless tobacco: Never  Used  Vaping Use  . Vaping Use: Never used  Substance Use Topics  . Alcohol use: Not Currently    Comment: occasional wine  . Drug use: No    Current Medications and Allergies:    Current Outpatient Medications:  .  albuterol (VENTOLIN HFA) 108 (90 Base) MCG/ACT inhaler, INHALE 2 PUFFS BY MOUTH INTO THE LUNGS EVERY 6 HOURS AS NEEDED FOR WHEEZING, Disp: 8.5 g, Rfl: 0 .  atorvastatin (LIPITOR) 20 MG tablet, Take 1 tablet (20 mg total) by mouth daily., Disp: 90 tablet, Rfl: 1 .  benazepril-hydrochlorthiazide (LOTENSIN HCT) 20-25 MG tablet, Take 1 tablet by mouth daily., Disp: 30 tablet, Rfl: 2 .  Calcium-Phosphorus-Vitamin D (CITRACAL +D3 PO), Take 2 tablets by mouth daily., Disp: , Rfl:  .  Cholecalciferol (VITAMIN D3) 50 MCG (2000 UT) TABS, Take 2,000 Units by mouth daily., Disp: ,  Rfl:  .  ibuprofen (ADVIL,MOTRIN) 200 MG tablet, Take 400 mg by mouth every 6 (six) hours as needed. For pain , Disp: , Rfl:  .  omeprazole (PRILOSEC) 20 MG capsule, Take 1 capsule (20 mg total) by mouth daily., Disp: 90 capsule, Rfl: 3   Allergies  Allergen Reactions  . Cephalexin Itching and Nausea And Vomiting    Review of Systems   ROS Negative unless otherwise specified per HPI.  Vitals:   Vitals:   09/01/20 0901  BP: 126/80  Pulse: 75  Temp: 98.3 F (36.8 C)  TempSrc: Temporal  SpO2: 93%  Weight: 157 lb (71.2 kg)  Height: 5\' 1"  (1.549 m)     Body mass index is 29.66 kg/m.   Physical Exam:    Physical Exam Vitals and nursing note reviewed.  Constitutional:      General: She is not in acute distress.    Appearance: She is well-developed. She is not ill-appearing or toxic-appearing.  Cardiovascular:     Rate and Rhythm: Normal rate and regular rhythm.     Pulses: Normal pulses.     Heart sounds: Normal heart sounds, S1 normal and S2 normal.     Comments: No LE edema Pulmonary:     Effort: Pulmonary effort is normal.     Breath sounds: Normal breath sounds.  Skin:    General: Skin is warm and dry.  Neurological:     Mental Status: She is alert.     GCS: GCS eye subscore is 4. GCS verbal subscore is 5. GCS motor subscore is 6.  Psychiatric:        Speech: Speech normal.        Behavior: Behavior normal. Behavior is cooperative.      Assessment and Plan:    Kendra Schultz was seen today for hyperlipidemia.  Diagnoses and all orders for this visit:  Hyperlipidemia, unspecified hyperlipidemia type Update lipid panel today and determine need for change in lipitor 20 mg based on results. Follow-up in 6 months. -     Lipid panel; Future -     Lipid panel  Special screening for malignant neoplasms, colon Referral re-submitted and I have given her the phone number to call and have this scheduled. -     Ambulatory referral to  Gastroenterology  Osteoporosis, unspecified osteoporosis type, unspecified pathological fracture presence DEXA ordered. Patient encouraged to schedule at our front desk.  Other orders -     atorvastatin (LIPITOR) 20 MG tablet; Take 1 tablet (20 mg total) by mouth daily. -     omeprazole (PRILOSEC) 20 MG capsule; Take 1 capsule (20  mg total) by mouth daily.  CMA or LPN served as scribe during this visit. History, Physical, and Plan performed by medical provider. The above documentation has been reviewed and is accurate and complete.  Inda Coke, PA-C [place] [place] 09/01/2020  Follow-up: No follow-ups on file.

## 2020-09-01 NOTE — Patient Instructions (Signed)
It was great to see you!  Schedule your bone density scan with the front desk on your way out.  Here is the number for to schedule your colonoscopy:  [phone]  Let's follow-up in 6 months, sooner if you have concerns.  Take care,  Inda Coke PA-C

## 2020-09-02 LAB — LIPID PANEL
Cholesterol: 128 mg/dL (ref <200)
HDL: 41 mg/dL — ABNORMAL LOW (ref >=50)
LDL Cholesterol (Calc): 62 mg/dL (calc)
Non-HDL Cholesterol (Calc): 87 mg/dL (calc) (ref <130)
Total CHOL/HDL Ratio: 3.1 (calc) (ref <5.0)
Triglycerides: 171 mg/dL — ABNORMAL HIGH (ref <150)

## 2020-09-12 ENCOUNTER — Ambulatory Visit (INDEPENDENT_AMBULATORY_CARE_PROVIDER_SITE_OTHER)
Admission: RE | Admit: 2020-09-12 | Discharge: 2020-09-12 | Disposition: A | Discharge status: Home / Self Care | Payer: Medicare Other | Source: Ambulatory Visit | Attending: Physician Assistant | Admitting: Physician Assistant

## 2020-09-12 ENCOUNTER — Other Ambulatory Visit: Payer: Self-pay

## 2020-09-12 DIAGNOSIS — M810 Age-related osteoporosis without current pathological fracture: Secondary | ICD-10-CM | POA: Diagnosis not present

## 2020-09-17 ENCOUNTER — Encounter: Payer: Self-pay | Admitting: Physician Assistant

## 2020-11-10 ENCOUNTER — Ambulatory Visit (INDEPENDENT_AMBULATORY_CARE_PROVIDER_SITE_OTHER): Payer: Medicare Other

## 2020-11-10 DIAGNOSIS — Z0000 Encounter for general adult medical examination without abnormal findings: Secondary | ICD-10-CM | POA: Diagnosis not present

## 2020-11-10 NOTE — Progress Notes (Addendum)
Virtual Visit via Telephone Note  I connected with  Kendra Schultz on 11/10/20 at 11:45 AM EST by telephone and verified that I am speaking with the correct person using two identifiers.  Medicare Annual Wellness visit completed telephonically due to Covid-19 pandemic.   Persons participating in this call: This Health Coach and this patient.   Location: Patient: Home Provider: Office   I discussed the limitations, risks, security and privacy concerns of performing an evaluation and management service by telephone and the availability of in person appointments. The patient expressed understanding and agreed to proceed.  Unable to perform video visit due to video visit attempted and failed and/or patient does not have video capability.   Some vital signs may be absent or patient reported.   Willette Brace, LPN    Subjective:   Kendra Schultz is a 75 y.o. female who presents for an Initial Medicare Annual Wellness Visit.  Review of Systems     Cardiac Risk Factors include: advanced age (>24men, >33 women);hypertension     Objective:    There were no vitals filed for this visit. There is no height or weight on file to calculate BMI.  Advanced Directives 11/10/2020 06/01/2019 05/05/2017 11/04/2016 07/10/2016 03/20/2016 06/20/2015  Does Patient Have a Medical Advance Directive? No No No No No No No  Would patient like information on creating a medical advance directive? No - Patient declined No - Patient declined - - No - patient declined information No - patient declined information -  Pre-existing out of facility DNR order (yellow form or pink MOST form) - - - - - - -    Current Medications (verified) Outpatient Encounter Medications as of 11/10/2020  Medication Sig  . atorvastatin (LIPITOR) 20 MG tablet Take 1 tablet (20 mg total) by mouth daily.  . benazepril-hydrochlorthiazide (LOTENSIN HCT) 20-25 MG tablet Take 1 tablet by mouth daily.  . Calcium-Phosphorus-Vitamin D (CITRACAL  +D3 PO) Take 2 tablets by mouth daily.  . Cholecalciferol (VITAMIN D3) 50 MCG (2000 UT) TABS Take 2,000 Units by mouth daily.  Marland Kitchen ibuprofen (ADVIL,MOTRIN) 200 MG tablet Take 400 mg by mouth every 6 (six) hours as needed. For pain  . omeprazole (PRILOSEC) 20 MG capsule Take 1 capsule (20 mg total) by mouth daily.  Marland Kitchen albuterol (VENTOLIN HFA) 108 (90 Base) MCG/ACT inhaler INHALE 2 PUFFS BY MOUTH INTO THE LUNGS EVERY 6 HOURS AS NEEDED FOR WHEEZING (Patient not taking: Reported on 11/10/2020)   No facility-administered encounter medications on file as of 11/10/2020.    Allergies (verified) Cephalexin   History: Past Medical History:  Diagnosis Date  . Anemia   . Arthritis   . Cataract    surgery to remove  . GERD (gastroesophageal reflux disease)   . Osteoporosis    Past Surgical History:  Procedure Laterality Date  . APPENDECTOMY    . BREAST LUMPECTOMY  1997   right  . CATARACT EXTRACTION, BILATERAL    . ESOPHAGOGASTRODUODENOSCOPY    . HYSTEROSCOPY WITH D & C N/A 06/01/2019   Procedure: DILATATION AND CURETTAGE /HYSTEROSCOPY;  Surgeon: Woodroe Mode, MD;  Location: [facility];  Service: Gynecology;  Laterality: N/A;  . MASTECTOMY  1995   left  . MASTECTOMY W/ SENTINEL NODE BIOPSY  09/17/2011   Procedure: MASTECTOMY WITH SENTINEL LYMPH NODE BIOPSY;  Surgeon: Joyice Faster. Cornett, MD;  Location: [facility];  Service: General;  Laterality: Right;  nuclear medicine injection 30 minutes prior for surgery   . OVARIAN CYST REMOVAL    .  TONSILLECTOMY     Family History  Problem Relation Age of Onset  . Heart disease Father   . Cancer Father        lymphoma  . Heart disease Brother   . Breast cancer Sister   . Breast cancer Sister    Social History   Socioeconomic History  . Marital status: Single    Spouse name: Not on file  . Number of children: 2  . Years of education: Not on file  . Highest education level: Not on file  Occupational History  . Occupation: retired  Tobacco Use  .  Smoking status: Former Smoker    Packs/day: 1.00    Types: Cigarettes    Quit date: 11/22/2011    Years since quitting: 8.9  . Smokeless tobacco: Never Used  Vaping Use  . Vaping Use: Never used  Substance and Sexual Activity  . Alcohol use: Not Currently    Comment: occasional wine  . Drug use: No  . Sexual activity: Not Currently    Birth control/protection: Post-menopausal  Other Topics Concern  . Not on file  Social History Narrative   Used to work at [employer]   Two sons -- live with her   Divorced/widowed   Social Determinants of Health   Financial Resource Strain: Low Risk   . Difficulty of Paying Living Expenses: Not hard at all  Food Insecurity: No Food Insecurity  . Worried About Charity fundraiser in the Last Year: Never true  . Ran Out of Food in the Last Year: Never true  Transportation Needs: No Transportation Needs  . Lack of Transportation (Medical): No  . Lack of Transportation (Non-Medical): No  Physical Activity: Inactive  . Days of Exercise per Week: 0 days  . Minutes of Exercise per Session: 0 min  Stress: No Stress Concern Present  . Feeling of Stress : Not at all  Social Connections: Socially Isolated  . Frequency of Communication with Friends and Family: Twice a week  . Frequency of Social Gatherings with Friends and Family: Never  . Attends Religious Services: Never  . Active Member of Clubs or Organizations: No  . Attends Archivist Meetings: Never  . Marital Status: Divorced    Tobacco Counseling Counseling given: Not Answered   Clinical Intake:  Pre-visit preparation completed: Yes  Pain : No/denies pain     BMI - recorded: 29.68 Nutritional Status: BMI 25 -29 Overweight Nutritional Risks: None Diabetes: No  How often do you need to have someone help you when you read instructions, pamphlets, or other written materials from your doctor or pharmacy?: 1 - Never  Diabetic?No  Interpreter Needed?: No  Information  entered by :: Charlott Rakes, LPN   Activities of Daily Living In your present state of health, do you have any difficulty performing the following activities: 11/10/2020  Hearing? N  Vision? N  Difficulty concentrating or making decisions? N  Walking or climbing stairs? N  Dressing or bathing? N  Doing errands, shopping? N  Preparing Food and eating ? N  Using the Toilet? N  In the past six months, have you accidently leaked urine? Y  Comment wears a pad for accidents  Do you have problems with loss of bowel control? N  Managing your Medications? N  Managing your Finances? N  Housekeeping or managing your Housekeeping? N  Some recent data might be hidden    Patient Care Team: Inda Coke, [place] as PCP - General (Physician Assistant)  Indicate any recent Medical Services you may have received from other than [facility] providers in the past year (date may be approximate).     Assessment:   This is a routine wellness examination for [facility].  Hearing/Vision screen  Hearing Screening   125Hz  250Hz  500Hz  1000Hz  2000Hz  3000Hz  4000Hz  6000Hz  8000Hz   Right ear:           Left ear:           Comments: Pt denies any hearing issues  Vision Screening Comments: Pt hasn't seen anyone for eye exams encouraged to follow up  Dietary issues and exercise activities discussed: Current Exercise Habits: The patient does not participate in regular exercise at present  Goals    . Patient Stated     Stay healthy      Depression Screen PHQ 2/9 Scores 11/10/2020 02/25/2020  PHQ - 2 Score 0 0    Fall Risk Fall Risk  11/10/2020 02/25/2020  Falls in the past year? 0 0  Number falls in past yr: 0 0  Injury with Fall? 0 -  Follow up Falls prevention discussed Falls evaluation completed;Education provided    FALL RISK PREVENTION PERTAINING TO THE HOME:  Any stairs in or around the home? Yes  If so, are there any without handrails? No  Home free of loose throw rugs in walkways, pet beds,  electrical cords, etc? Yes  Adequate lighting in your home to reduce risk of falls? Yes   ASSISTIVE DEVICES UTILIZED TO PREVENT FALLS:  Life alert? No  Use of a cane, walker or w/c? No  Grab bars in the bathroom? No  Shower chair or bench in shower? Yes  Elevated toilet seat or a handicapped toilet? No   TIMED UP AND GO:  Was the test performed? No .      Cognitive Function:     6CIT Screen 11/10/2020  What Year? 0 points  What month? 0 points  Count back from 20 0 points  Months in reverse 4 points  Repeat phrase 0 points    Immunizations Immunization History  Administered Date(s) Administered  . Pneumococcal Conjugate-13 09/01/2020    TDAP status: Due, Education has been provided regarding the importance of this vaccine. Advised may receive this vaccine at local pharmacy or Health Dept. Aware to provide a copy of the vaccination record if obtained from local pharmacy or Health Dept. Verbalized acceptance and understanding.  Flu Vaccine status: Declined, Education has been provided regarding the importance of this vaccine but patient still declined. Advised may receive this vaccine at local pharmacy or Health Dept. Aware to provide a copy of the vaccination record if obtained from local pharmacy or Health Dept. Verbalized acceptance and understanding.  Pneumococcal vaccine status: Declined,  Education has been provided regarding the importance of this vaccine but patient still declined. Advised may receive this vaccine at local pharmacy or Health Dept. Aware to provide a copy of the vaccination record if obtained from local pharmacy or Health Dept. Verbalized acceptance and understanding.   Covid-19 vaccine status: Declined, Education has been provided regarding the importance of this vaccine but patient still declined. Advised may receive this vaccine at local pharmacy or Health Dept.or vaccine clinic. Aware to provide a copy of the vaccination record if obtained from local  pharmacy or Health Dept. Verbalized acceptance and understanding.  Qualifies for Shingles Vaccine? Yes   Zostavax completed No   Shingrix Completed?: No.    Education has been provided regarding the importance of this  vaccine. Patient has been advised to call insurance company to determine out of pocket expense if they have not yet received this vaccine. Advised may also receive vaccine at local pharmacy or Health Dept. Verbalized acceptance and understanding.  Screening Tests Health Maintenance  Topic Date Due  . TETANUS/TDAP  09/01/2021 (Originally 07/09/1965)  . PNA vac Low Risk Adult (2 of 2 - PPSV23) 09/01/2021  . DEXA SCAN  Completed  . Hepatitis C Screening  Completed  . INFLUENZA VACCINE  Discontinued  . MAMMOGRAM  Discontinued  . COLONOSCOPY (Pts 45-24yrs Insurance coverage will need to be confirmed)  Discontinued  . COVID-19 Vaccine  Discontinued    Health Maintenance  There are no preventive care reminders to display for this patient.  Colorectal cancer screening: No longer required. Per pt preference  Mammogram status: No longer required due to pt request .  Bone Density status: Completed 09/12/20. Results reflect: Bone density results: OSTEOPENIA. Repeat every 2 years.   Additional Screening:  Hepatitis C Screening: Completed 01/24/17  Vision Screening: Recommended annual ophthalmology exams for early detection of glaucoma and other disorders of the eye. Is the patient up to date with their annual eye exam?  No  Who is the provider or what is the name of the office in which the patient attends annual eye exams? Pt declined  If pt is not established with a provider, would they like to be referred to a provider to establish care? No .   Dental Screening: Recommended annual dental exams for proper oral hygiene  Community Resource Referral / Chronic Care Management: CRR required this visit?  No   CCM required this visit?  No      Plan:     I have personally  reviewed and noted the following in the patient's chart:   . Medical and social history . Use of alcohol, tobacco or illicit drugs  . Current medications and supplements . Functional ability and status . Nutritional status . Physical activity . Advanced directives . List of other physicians . Hospitalizations, surgeries, and ER visits in previous 12 months . Vitals . Screenings to include cognitive, depression, and falls . Referrals and appointments  In addition, I have reviewed and discussed with patient certain preventive protocols, quality metrics, and best practice recommendations. A written personalized care plan for preventive services as well as general preventive health recommendations were provided to patient.     Willette Brace, LPN   [id number]   Nurse Notes: None   I have reviewed documentation for AWV and Advance Care planning provided by Health Coach, I agree with documentation, I was immediately available for any questions. Inda Coke, [place]

## 2020-11-10 NOTE — Patient Instructions (Addendum)
Kendra Schultz , Thank you for taking time to come for your Medicare Wellness Visit. I appreciate your ongoing commitment to your health goals. Please review the following plan we discussed and let me know if I can assist you in the future.   Screening recommendations/referrals: Colonoscopy: Declined and discussed Mammogram: Declined and discussed Bone Density: Done 09/12/20 Recommended yearly ophthalmology/optometry visit for glaucoma screening and checkup Recommended yearly dental visit for hygiene and checkup  Vaccinations: Influenza vaccine: Declined and discussed Pneumococcal vaccine: Declined and discussed Tdap vaccine: Declined and discussed Shingles vaccine: Shingrix discussed. Please contact your pharmacy for coverage information.  Covid-19:Declined and discussed  Advanced directives: Advance directive discussed with you today. Even though you declined this today please call our office should you change your mind and we can give you the proper paperwork for you to fill out.  Conditions/risks identified: Stay healthy as I am right now!  Next appointment: Follow up in one year for your annual wellness visit   Preventive Care 75 Years and Older, Female Preventive care refers to lifestyle choices and visits with your health care provider that can promote health and wellness. What does preventive care include?  A yearly physical exam. This is also called an annual well check.  Dental exams once or twice a year.  Routine eye exams. Ask your health care provider how often you should have your eyes checked.  Personal lifestyle choices, including:  Daily care of your teeth and gums.  Regular physical activity.  Eating a healthy diet.  Avoiding tobacco and drug use.  Limiting alcohol use.  Practicing safe sex.  Taking low-dose aspirin every day.  Taking vitamin and mineral supplements as recommended by your health care provider. What happens during an annual well  check? The services and screenings done by your health care provider during your annual well check will depend on your age, overall health, lifestyle risk factors, and family history of disease. Counseling  Your health care provider may ask you questions about your:  Alcohol use.  Tobacco use.  Drug use.  Emotional well-being.  Home and relationship well-being.  Sexual activity.  Eating habits.  History of falls.  Memory and ability to understand (cognition).  Work and work Statistician.  Reproductive health. Screening  You may have the following tests or measurements:  Height, weight, and BMI.  Blood pressure.  Lipid and cholesterol levels. These may be checked every 5 years, or more frequently if you are over 20 years old.  Skin check.  Lung cancer screening. You may have this screening every year starting at age 88 if you have a 30-pack-year history of smoking and currently smoke or have quit within the past 15 years.  Fecal occult blood test (FOBT) of the stool. You may have this test every year starting at age 26.  Flexible sigmoidoscopy or colonoscopy. You may have a sigmoidoscopy every 5 years or a colonoscopy every 10 years starting at age 94.  Hepatitis C blood test.  Hepatitis B blood test.  Sexually transmitted disease (STD) testing.  Diabetes screening. This is done by checking your blood sugar (glucose) after you have not eaten for a while (fasting). You may have this done every 1-3 years.  Bone density scan. This is done to screen for osteoporosis. You may have this done starting at age 32.  Mammogram. This may be done every 1-2 years. Talk to your health care provider about how often you should have regular mammograms. Talk with your health care provider  about your test results, treatment options, and if necessary, the need for more tests. Vaccines  Your health care provider may recommend certain vaccines, such as:  Influenza vaccine. This is  recommended every year.  Tetanus, diphtheria, and acellular pertussis (Tdap, Td) vaccine. You may need a Td booster every 10 years.  Zoster vaccine. You may need this after age 40.  Pneumococcal 13-valent conjugate (PCV13) vaccine. One dose is recommended after age 5.  Pneumococcal polysaccharide (PPSV23) vaccine. One dose is recommended after age 29. Talk to your health care provider about which screenings and vaccines you need and how often you need them. This information is not intended to replace advice given to you by your health care provider. Make sure you discuss any questions you have with your health care provider. Document Released: 11/03/2015 Document Revised: 06/26/2016 Document Reviewed: 08/08/2015 Elsevier Interactive Patient Education  2017 [place] Prevention in the Home Falls can cause injuries. They can happen to people of all ages. There are many things you can do to make your home safe and to help prevent falls. What can I do on the outside of my home?  Regularly fix the edges of walkways and driveways and fix any cracks.  Remove anything that might make you trip as you walk through a door, such as a raised step or threshold.  Trim any bushes or trees on the path to your home.  Use bright outdoor lighting.  Clear any walking paths of anything that might make someone trip, such as rocks or tools.  Regularly check to see if handrails are loose or broken. Make sure that both sides of any steps have handrails.  Any raised decks and porches should have guardrails on the edges.  Have any leaves, snow, or ice cleared regularly.  Use sand or salt on walking paths during winter.  Clean up any spills in your garage right away. This includes oil or grease spills. What can I do in the bathroom?  Use night lights.  Install grab bars by the toilet and in the tub and shower. Do not use towel bars as grab bars.  Use non-skid mats or decals in the tub or  shower.  If you need to sit down in the shower, use a plastic, non-slip stool.  Keep the floor dry. Clean up any water that spills on the floor as soon as it happens.  Remove soap buildup in the tub or shower regularly.  Attach bath mats securely with double-sided non-slip rug tape.  Do not have throw rugs and other things on the floor that can make you trip. What can I do in the bedroom?  Use night lights.  Make sure that you have a light by your bed that is easy to reach.  Do not use any sheets or blankets that are too big for your bed. They should not hang down onto the floor.  Have a firm chair that has side arms. You can use this for support while you get dressed.  Do not have throw rugs and other things on the floor that can make you trip. What can I do in the kitchen?  Clean up any spills right away.  Avoid walking on wet floors.  Keep items that you use a lot in easy-to-reach places.  If you need to reach something above you, use a strong step stool that has a grab bar.  Keep electrical cords out of the way.  Do not use floor polish or  wax that makes floors slippery. If you must use wax, use non-skid floor wax.  Do not have throw rugs and other things on the floor that can make you trip. What can I do with my stairs?  Do not leave any items on the stairs.  Make sure that there are handrails on both sides of the stairs and use them. Fix handrails that are broken or loose. Make sure that handrails are as long as the stairways.  Check any carpeting to make sure that it is firmly attached to the stairs. Fix any carpet that is loose or worn.  Avoid having throw rugs at the top or bottom of the stairs. If you do have throw rugs, attach them to the floor with carpet tape.  Make sure that you have a light switch at the top of the stairs and the bottom of the stairs. If you do not have them, ask someone to add them for you. What else can I do to help prevent  falls?  Wear shoes that:  Do not have high heels.  Have rubber bottoms.  Are comfortable and fit you well.  Are closed at the toe. Do not wear sandals.  If you use a stepladder:  Make sure that it is fully opened. Do not climb a closed stepladder.  Make sure that both sides of the stepladder are locked into place.  Ask someone to hold it for you, if possible.  Clearly mark and make sure that you can see:  Any grab bars or handrails.  First and last steps.  Where the edge of each step is.  Use tools that help you move around (mobility aids) if they are needed. These include:  Canes.  Walkers.  Scooters.  Crutches.  Turn on the lights when you go into a dark area. Replace any light bulbs as soon as they burn out.  Set up your furniture so you have a clear path. Avoid moving your furniture around.  If any of your floors are uneven, fix them.  If there are any pets around you, be aware of where they are.  Review your medicines with your doctor. Some medicines can make you feel dizzy. This can increase your chance of falling. Ask your doctor what other things that you can do to help prevent falls. This information is not intended to replace advice given to you by your health care provider. Make sure you discuss any questions you have with your health care provider. Document Released: 08/03/2009 Document Revised: 03/14/2016 Document Reviewed: 11/11/2014 Elsevier Interactive Patient Education  2017 [employer].

## 2020-11-20 ENCOUNTER — Other Ambulatory Visit: Payer: Self-pay | Admitting: Physician Assistant

## 2020-12-18 ENCOUNTER — Encounter: Payer: Self-pay | Admitting: Internal Medicine

## 2021-02-09 ENCOUNTER — Telehealth (INDEPENDENT_AMBULATORY_CARE_PROVIDER_SITE_OTHER): Payer: Medicare Other | Admitting: Physician Assistant

## 2021-02-09 ENCOUNTER — Encounter: Payer: Self-pay | Admitting: Physician Assistant

## 2021-02-09 VITALS — Ht 61.0 in | Wt 155.0 lb

## 2021-02-09 DIAGNOSIS — R059 Cough, unspecified: Secondary | ICD-10-CM

## 2021-02-09 MED ORDER — PREDNISONE 20 MG PO TABS: 40.0000 mg | ORAL_TABLET | Freq: Every day | ORAL | 0 refills | Status: DC

## 2021-02-09 MED ORDER — AZITHROMYCIN 250 MG PO TABS: ORAL_TABLET | ORAL | 0 refills | Status: DC

## 2021-02-09 MED ORDER — ALBUTEROL SULFATE HFA 108 (90 BASE) MCG/ACT IN AERS: INHALATION_SPRAY | RESPIRATORY_TRACT | 0 refills | Status: DC

## 2021-02-09 NOTE — Progress Notes (Signed)
Virtual Visit via Video   I connected with Kendra Schultz on 02/09/21 at 12:00 PM EDT by a video enabled telemedicine application and verified that I am speaking with the correct person using two identifiers. Location patient: Home Location provider: [facility], Office Persons participating in the virtual visit: Asusena Sigley, Inda Coke PA-C, Anselmo Pickler, LPN   I discussed the limitations of evaluation and management by telemedicine and the availability of in person appointments. The patient expressed understanding and agreed to proceed.  I acted as a Education administrator for [employer], [employer], LPN   Subjective:   HPI:   Cough Pt c/o cough x 2 weeks, low back pain worse with coughing. Coughing and expectorating white sputum. Also having nose bleeds for the past few days, that last for about 3-4 minutes. Denies headaches, dizziness, no fever or chills and denies urinary symptoms. She is feeling SOB at time. Denies chest pain, palpitations, calf pain, LE swelling.  ROS: See pertinent positives and negatives per HPI.  Patient Active Problem List   Diagnosis Date Noted  . Benign essential hypertension 02/25/2020  . Gastritis 02/25/2020  . Arthritis   . Postmenopausal bleeding 04/07/2019  . GERD (gastroesophageal reflux disease) 01/24/2017  . Osteopenia 03/09/2014  . Cancer of right breast, stage 1 ([place]) 09/06/2013    Social History   Tobacco Use  . Smoking status: Former Smoker    Packs/day: 1.00    Types: Cigarettes    Quit date: 11/22/2011    Years since quitting: 9.2  . Smokeless tobacco: Never Used  Substance Use Topics  . Alcohol use: Not Currently    Comment: occasional wine    Current Outpatient Medications:  .  albuterol (VENTOLIN HFA) 108 (90 Base) MCG/ACT inhaler, INHALE 2 PUFFS BY MOUTH INTO THE LUNGS EVERY 6 HOURS AS NEEDED FOR WHEEZING, Disp: 8.5 g, Rfl: 0 .  atorvastatin (LIPITOR) 20 MG tablet, Take 1 tablet (20 mg total) by mouth daily.,  Disp: 90 tablet, Rfl: 1 .  azithromycin (ZITHROMAX) 250 MG tablet, Take 2 tablets on day 1, then 1 tablet daily on days 2 through 5, Disp: 6 tablet, Rfl: 0 .  benazepril-hydrochlorthiazide (LOTENSIN HCT) 20-25 MG tablet, TAKE 1 TABLET BY MOUTH DAILY, Disp: 30 tablet, Rfl: 2 .  Calcium-Phosphorus-Vitamin D (CITRACAL +D3 PO), Take 2 tablets by mouth daily., Disp: , Rfl:  .  Cholecalciferol (VITAMIN D3) 50 MCG (2000 UT) TABS, Take 2,000 Units by mouth daily., Disp: , Rfl:  .  ibuprofen (ADVIL,MOTRIN) 200 MG tablet, Take 400 mg by mouth every 6 (six) hours as needed. For pain, Disp: , Rfl:  .  omeprazole (PRILOSEC) 20 MG capsule, Take 1 capsule (20 mg total) by mouth daily., Disp: 90 capsule, Rfl: 3 .  predniSONE (DELTASONE) 20 MG tablet, Take 2 tablets (40 mg total) by mouth daily., Disp: 10 tablet, Rfl: 0  Allergies  Allergen Reactions  . Cephalexin Itching and Nausea And Vomiting   Vitals: Oxygen 93-94% HR 85   Objective:   VITALS: Per patient if applicable, see vitals. GENERAL: Alert, appears well and in no acute distress. HEENT: Atraumatic, conjunctiva clear, no obvious abnormalities on inspection of external nose and ears. NECK: Normal movements of the head and neck. CARDIOPULMONARY: No increased WOB. Speaking in clear sentences. I:E ratio WNL.  MS: Moves all visible extremities without noticeable abnormality. PSYCH: Pleasant and cooperative, well-groomed. Speech normal rate and rhythm. Affect is appropriate. Insight and judgement are appropriate. Attention is focused, linear, and appropriate.  NEURO: CN grossly intact. Oriented as arrived to appointment on time with no prompting. Moves both UE equally.  SKIN: No obvious lesions, wounds, erythema, or cyanosis noted on face or hands.  Patient arrived briefly in parking lot for me to evaluate her in the car She had decreased breath sounds in bilateral lower lungs but no crackles/wheeze No respiratory distress Adequate pulses in  bilateral DP/PT No LE edema or calf TTP  Assessment and Plan:   Dewey was seen today for cough.  Diagnoses and all orders for this visit:  Cough No red flags on exam. (O2 level is 93-94% but this is consistent readings from past visits.)  Concern for possible bronchitis / sinusitis. Will initiate oral prednisone, zpack and albuterol inhaler prn per orders. Discussed taking medications as prescribed. Reviewed return precautions including worsening fever, SOB, worsening cough or other concerns. Push fluids and rest. I recommend that patient follow-up if symptoms worsen or persist despite treatment x 7-10 days, sooner if needed. We are also treating for COVID-19 and I recommended quarantining until results have returned.  Any worsening symptoms over the weekend, she was instructed to go to the ER. -     Novel Coronavirus, NAA ([facility])  Other orders -     predniSONE (DELTASONE) 20 MG tablet; Take 2 tablets (40 mg total) by mouth daily. -     azithromycin (ZITHROMAX) 250 MG tablet; Take 2 tablets on day 1, then 1 tablet daily on days 2 through 5    I discussed the assessment and treatment plan with the patient. The patient was provided an opportunity to ask questions and all were answered. The patient agreed with the plan and demonstrated an understanding of the instructions.   The patient was advised to call back or seek an in-person evaluation if the symptoms worsen or if the condition fails to improve as anticipated.   CMA or LPN served as scribe during this visit. History, Physical, and Plan performed by medical provider. The above documentation has been reviewed and is accurate and complete.  [place], [place] 02/09/2021

## 2021-02-10 LAB — NOVEL CORONAVIRUS, NAA: SARS-CoV-2, NAA: NOT DETECTED

## 2021-02-10 LAB — SARS-COV-2, NAA 2 DAY TAT

## 2021-02-12 ENCOUNTER — Telehealth: Payer: Self-pay | Admitting: *Deleted

## 2021-02-12 NOTE — Telephone Encounter (Signed)
Left message on voicemail to call office. [name] would like pt to be seen tomorrow by Dutch Quint. Please schedule.

## 2021-02-13 ENCOUNTER — Ambulatory Visit: Payer: Medicare Other | Admitting: Family

## 2021-02-14 ENCOUNTER — Ambulatory Visit (AMBULATORY_SURGERY_CENTER): Payer: Self-pay | Admitting: *Deleted

## 2021-02-14 ENCOUNTER — Other Ambulatory Visit: Payer: Self-pay

## 2021-02-14 VITALS — Ht 61.0 in | Wt 155.0 lb

## 2021-02-14 DIAGNOSIS — Z1211 Encounter for screening for malignant neoplasm of colon: Secondary | ICD-10-CM

## 2021-02-14 MED ORDER — NA SULFATE-K SULFATE-MG SULF 17.5-3.13-1.6 GM/177ML PO SOLN: 1.0000 | Freq: Once | ORAL | 0 refills | Status: AC

## 2021-02-14 NOTE — Progress Notes (Signed)

## 2021-02-18 ENCOUNTER — Other Ambulatory Visit: Payer: Self-pay | Admitting: Physician Assistant

## 2021-02-28 ENCOUNTER — Encounter: Payer: Self-pay | Admitting: Internal Medicine

## 2021-02-28 ENCOUNTER — Ambulatory Visit (AMBULATORY_SURGERY_CENTER): Payer: Medicare Other | Admitting: Internal Medicine

## 2021-02-28 ENCOUNTER — Other Ambulatory Visit: Payer: Self-pay

## 2021-02-28 VITALS — BP 145/76 | HR 52 | Temp 98.0°F | Resp 16 | Ht 61.0 in | Wt 155.0 lb

## 2021-02-28 DIAGNOSIS — K621 Rectal polyp: Secondary | ICD-10-CM | POA: Diagnosis not present

## 2021-02-28 DIAGNOSIS — Z1211 Encounter for screening for malignant neoplasm of colon: Secondary | ICD-10-CM | POA: Diagnosis not present

## 2021-02-28 DIAGNOSIS — D122 Benign neoplasm of ascending colon: Secondary | ICD-10-CM | POA: Diagnosis not present

## 2021-02-28 DIAGNOSIS — D128 Benign neoplasm of rectum: Secondary | ICD-10-CM

## 2021-02-28 MED ORDER — SODIUM CHLORIDE 0.9 % IV SOLN: 500.0000 mL | Freq: Once | INTRAVENOUS | Status: AC

## 2021-02-28 NOTE — Progress Notes (Signed)
PT taken to PACU. Monitors in place. VSS. Report given to RN. 

## 2021-02-28 NOTE — Progress Notes (Signed)
Called to room to assist during endoscopic procedure.  Patient ID and intended procedure confirmed with present staff. Received instructions for my participation in the procedure from the performing physician.  

## 2021-02-28 NOTE — Op Note (Signed)
[name]: Kendra Schultz Procedure Date: 02/28/2021 11:10 AM MRN: [id number] Endoscopist: [name] , MD Age: 75 Referring MD:  Date of Birth: [birth year] Gender: Female Account #: [id number] Procedure:                Colonoscopy Indications:              Screening for colorectal malignant neoplasm, This                            is the patient's first colonoscopy Medicines:                Propofol per Anesthesia, Monitored Anesthesia Care Procedure:                Pre-Anesthesia Assessment:                           - Prior to the procedure, a History and Physical                            was performed, and patient medications and                            allergies were reviewed. The patient's tolerance of                            previous anesthesia was also reviewed. The risks                            and benefits of the procedure and the sedation                            options and risks were discussed with the patient.                            All questions were answered, and informed consent                            was obtained. Prior Anticoagulants: The patient has                            taken no previous anticoagulant or antiplatelet                            agents. ASA Grade Assessment: III - A patient with                            severe systemic disease. After reviewing the risks                            and benefits, the patient was deemed in                            satisfactory condition to undergo the procedure.  After obtaining informed consent, the colonoscope                            was passed under direct vision. Throughout the                            procedure, the patient's blood pressure, pulse, and                            oxygen saturations were monitored continuously. The                            Olympus CF-HQ190 [id number]) Colonoscope was                             introduced through the anus and advanced to the the                            cecum, identified by appendiceal orifice and                            ileocecal valve. The colonoscopy was performed                            without difficulty. The patient tolerated the                            procedure well. The quality of the bowel                            preparation was adequate. The ileocecal valve,                            appendiceal orifice, and rectum were photographed.                            The bowel preparation used was SUPREP via split                            dose instruction. Scope In: 11:14:30 AM Scope Out: 11:40:50 AM Scope Withdrawal Time: 0 hours 23 minutes 45 seconds  Total Procedure Duration: 0 hours 26 minutes 20 seconds  Findings:                 The perianal and digital rectal examinations were                            normal.                           Four sessile polyps were found in the rectum and                            ascending colon. The polyps were 1 to 10 mm in  size. These polyps were removed with a cold snare.                            Resection and retrieval were complete. Verification                            of patient identification for the specimen was                            done. Estimated blood loss was minimal.                           Diverticula were found in the entire colon.                           The exam was otherwise without abnormality on                            direct and retroflexion views. Complications:            No immediate complications. Estimated Blood Loss:     Estimated blood loss was minimal. Impression:               - Four 1 to 10 mm polyps in the rectum and in the                            ascending colon, removed with a cold snare.                            Resected and retrieved.                           - Diverticulosis in the entire examined colon.                            - The examination was otherwise normal on direct                            and retroflexion views. Recommendation:           - Patient has a contact number available for                            emergencies. The signs and symptoms of potential                            delayed complications were discussed with the                            patient. Return to normal activities tomorrow.                            Written discharge instructions were provided to the  patient.                           - Resume previous diet.                           - Continue present medications.                           - Repeat colonoscopy is recommended. The                            colonoscopy date will be determined after pathology                            results from today's exam become available for                            review. Diofferent vs extra prep - copious                            irrigation required [name], MD 02/28/2021 11:48:09 AM This report has been signed electronically.

## 2021-02-28 NOTE — Patient Instructions (Addendum)
I found and removed 4 polyps - all look benign. You also have a condition called diverticulosis - common and not usually a problem. Please read the handout provided.  I will let you know pathology results and when to have another routine colonoscopy by mail and/or My Chart.  I appreciate the opportunity to care for you. YOU HAD AN ENDOSCOPIC PROCEDURE TODAY AT [facility]:   Refer to the procedure report that was given to you for any specific questions about what was found during the examination.  If the procedure report does not answer your questions, please call your gastroenterologist to clarify.  If you requested that your care partner not be given the details of your procedure findings, then the procedure report has been included in a sealed envelope for you to review at your convenience later.  YOU SHOULD EXPECT: Some feelings of bloating in the abdomen. Passage of more gas than usual.  Walking can help get rid of the air that was put into your GI tract during the procedure and reduce the bloating. If you had a lower endoscopy (such as a colonoscopy or flexible sigmoidoscopy) you may notice spotting of blood in your stool or on the toilet paper. If you underwent a bowel prep for your procedure, you may not have a normal bowel movement for a few days.  Please Note:  You might notice some irritation and congestion in your nose or some drainage.  This is from the oxygen used during your procedure.  There is no need for concern and it should clear up in a day or so.  SYMPTOMS TO REPORT IMMEDIATELY:   Following lower endoscopy (colonoscopy or flexible sigmoidoscopy):  Excessive amounts of blood in the stool  Significant tenderness or worsening of abdominal pains  Swelling of the abdomen that is new, acute  Fever of 100F or higher   For urgent or emergent issues, a gastroenterologist can be reached at any hour by calling [phone]. Do not use MyChart messaging for urgent  concerns.    DIET:  We do recommend a small meal at first, but then you may proceed to your regular diet.  Drink plenty of fluids but you should avoid alcoholic beverages for 24 hours.  MEDICATIONS: Continue present medications.  Please see handouts given to you by your recovery nurse.  Thank you for allowing [place] to provide for your healthcare needs today.  ACTIVITY:  You should plan to take it easy for the rest of today and you should NOT DRIVE or use heavy machinery until tomorrow (because of the sedation medicines used during the test).    FOLLOW UP: Our staff will call the number listed on your records 48-72 hours following your procedure to check on you and address any questions or concerns that you may have regarding the information given to you following your procedure. If we do not reach you, we will leave a message.  We will attempt to reach you two times.  During this call, we will ask if you have developed any symptoms of COVID 19. If you develop any symptoms (ie: fever, flu-like symptoms, shortness of breath, cough etc.) before then, please call [phone].  If you test positive for Covid 19 in the 2 weeks post procedure, please call and report this information to [place].    If any biopsies were taken you will be contacted by phone or by letter within the next 1-3 weeks.  Please call us at [phone] if you  have not heard about the biopsies in 3 weeks.    SIGNATURES/CONFIDENTIALITY: You and/or your care partner have signed paperwork which will be entered into your electronic medical record.  These signatures attest to the fact that that the information above on your After Visit Summary has been reviewed and is understood.  Full responsibility of the confidentiality of this discharge information lies with you and/or your care-partner.

## 2021-02-28 NOTE — Progress Notes (Signed)
No new medical updates. Vitals- Kendra Schultz  Patient said yes to put IV in right arm.

## 2021-03-02 ENCOUNTER — Other Ambulatory Visit: Payer: Self-pay

## 2021-03-02 ENCOUNTER — Telehealth: Payer: Self-pay | Admitting: *Deleted

## 2021-03-02 ENCOUNTER — Encounter: Payer: Self-pay | Admitting: Family Medicine

## 2021-03-02 ENCOUNTER — Telehealth: Payer: Self-pay

## 2021-03-02 ENCOUNTER — Ambulatory Visit: Payer: Medicare Other | Admitting: Physician Assistant

## 2021-03-02 ENCOUNTER — Ambulatory Visit (INDEPENDENT_AMBULATORY_CARE_PROVIDER_SITE_OTHER): Payer: Medicare Other | Admitting: Family Medicine

## 2021-03-02 VITALS — BP 120/81 | HR 61 | Temp 98.6°F | Ht 61.0 in | Wt 153.4 lb

## 2021-03-02 DIAGNOSIS — I10 Essential (primary) hypertension: Secondary | ICD-10-CM

## 2021-03-02 DIAGNOSIS — M549 Dorsalgia, unspecified: Secondary | ICD-10-CM

## 2021-03-02 DIAGNOSIS — E785 Hyperlipidemia, unspecified: Secondary | ICD-10-CM | POA: Diagnosis not present

## 2021-03-02 LAB — LIPID PANEL
Cholesterol: 154 mg/dL (ref 0–200)
HDL: 43.7 mg/dL (ref >39.00)
LDL Cholesterol: 75 mg/dL (ref 0–99)
NonHDL: 109.98
Total CHOL/HDL Ratio: 4
Triglycerides: 177 mg/dL — ABNORMAL HIGH (ref 0.0–149.0)
VLDL: 35.4 mg/dL (ref 0.0–40.0)

## 2021-03-02 LAB — COMPREHENSIVE METABOLIC PANEL
ALT: 16 U/L (ref 0–35)
AST: 17 U/L (ref 0–37)
Albumin: 4.2 g/dL (ref 3.5–5.2)
Alkaline Phosphatase: 64 U/L (ref 39–117)
BUN: 14 mg/dL (ref 6–23)
CO2: 26 mEq/L (ref 19–32)
Calcium: 9.7 mg/dL (ref 8.4–10.5)
Chloride: 106 mEq/L (ref 96–112)
Creatinine, Ser: 0.91 mg/dL (ref 0.40–1.20)
GFR: 62.09 mL/min (ref >60.00)
Glucose, Bld: 108 mg/dL — ABNORMAL HIGH (ref 70–99)
Potassium: 3.7 mEq/L (ref 3.5–5.1)
Sodium: 141 mEq/L (ref 135–145)
Total Bilirubin: 0.4 mg/dL (ref 0.2–1.2)
Total Protein: 7.1 g/dL (ref 6.0–8.3)

## 2021-03-02 NOTE — Patient Instructions (Signed)
It was very nice to see you today!  We will check blood work and a urine sample today.  Depending on results we may need to send in an anti-inflammatory for your back.  Please come back to see [name] in 6 months.  Take care, Dr Jerline Pain  PLEASE NOTE:  If you had any lab tests please let us know if you have not heard back within a few days. You may see your results on mychart before we have a chance to review them but we will give you a call once they are reviewed by [place]. If we ordered any referrals today, please let us know if you have not heard from their office within the next week.   Please try these tips to maintain a healthy lifestyle:   Eat at least 3 REAL meals and 1-2 snacks per day.  Aim for no more than 5 hours between eating.  If you eat breakfast, please do so within one hour of getting up.    Each meal should contain half fruits/vegetables, one quarter protein, and one quarter carbs (no bigger than a computer mouse)   Cut down on sweet beverages. This includes juice, soda, and sweet tea.     Drink at least 1 glass of water with each meal and aim for at least 8 glasses per day   Exercise at least 150 minutes every week.

## 2021-03-02 NOTE — Progress Notes (Signed)
   Kendra Schultz is a 75 y.o. female who presents today for an office visit.  Assessment/Plan:  New/Acute Problems: Back pain No red flags.  Likely muscular.  She has no bony tenderness and no midline tenderness-doubt that this is a skeletal issue.  Offered muscle relaxer however patient declined.  We will update labs today assuming her renal function is normal we can try NSAIDs as well.  She will let me know if symptoms are not not improving over the next couple of weeks.  We will need x-ray if not proving.  Chronic Problems Addressed Today: Dyslipidemia Check lipids as directed by PCP.  She is on Lipitor 20 mg daily.  Benign essential hypertension Well-controlled on benazepril-HCTZ 20-25 once daily.  Check labs today.     Subjective:  HPI:  See A/P for status of chronic conditions  She has also had back pain for the last few weeks.  She had viral illness and had some pain with coughing.  Pain located in mid back.  No reported lower extremity weakness or numbness.  She has tried ibuprofen with no significant improvement.  Symptoms have been overall stable.       Objective:  Physical Exam: BP 120/81   Pulse 61   Temp 98.6 F (37 C) (Temporal)   Ht 5\' 1"  (1.549 m)   Wt 153 lb 6.4 oz (69.6 kg)   LMP  (LMP Unknown) Comment: LMP 1998  SpO2 96%   BMI 28.98 kg/m   Gen: No acute distress, resting comfortably CV: Regular rate and rhythm with no murmurs appreciated Pulm: Normal work of breathing, clear to auscultation bilaterally with no crackles, wheezes, or rhonchi MSK: Back without deformities.  Tender to palpation along upper lumbar lower thoracic paraspinal muscles.  No CVA tenderness.  No midline tenderness. Neuro: Grossly normal, moves all extremities Psych: Normal affect and thought content      Dominik Lauricella M. Jerline Pain, MD 03/02/2021 11:31 AM

## 2021-03-02 NOTE — Assessment & Plan Note (Signed)
Well-controlled on benazepril-HCTZ 20-25 once daily.  Check labs today.

## 2021-03-02 NOTE — Telephone Encounter (Signed)
  Follow up Call-  Call back number 02/28/2021  Post procedure Call Back phone  # [phone]  Permission to leave phone message Yes  Some recent data might be hidden     Patient questions:  Person hung up on me.

## 2021-03-02 NOTE — Telephone Encounter (Signed)
LVM

## 2021-03-02 NOTE — Assessment & Plan Note (Signed)
Check lipids as directed by PCP.  She is on Lipitor 20 mg daily.

## 2021-03-03 LAB — URINE CULTURE
MICRO NUMBER:: 11888041
Result:: NO GROWTH
SPECIMEN QUALITY:: ADEQUATE

## 2021-03-06 NOTE — Progress Notes (Signed)
Please inform patient of the following:  Her blood sugar and cholesterol were just a little bit high but everything else was normal.  We can try an anti-inflammatory for her back pain if she wishes.  I would like for her to continue her other medications.

## 2021-03-10 ENCOUNTER — Encounter: Payer: Self-pay | Admitting: Internal Medicine

## 2021-05-19 ENCOUNTER — Other Ambulatory Visit: Payer: Self-pay | Admitting: Physician Assistant

## 2021-05-29 ENCOUNTER — Telehealth (INDEPENDENT_AMBULATORY_CARE_PROVIDER_SITE_OTHER): Payer: Medicare Other | Admitting: Family Medicine

## 2021-05-29 ENCOUNTER — Other Ambulatory Visit: Payer: Self-pay

## 2021-05-29 ENCOUNTER — Encounter: Payer: Self-pay | Admitting: Family Medicine

## 2021-05-29 ENCOUNTER — Telehealth: Payer: Self-pay

## 2021-05-29 DIAGNOSIS — R42 Dizziness and giddiness: Secondary | ICD-10-CM | POA: Diagnosis not present

## 2021-05-29 DIAGNOSIS — Z20822 Contact with and (suspected) exposure to covid-19: Secondary | ICD-10-CM | POA: Diagnosis not present

## 2021-05-29 NOTE — Patient Instructions (Signed)
Seek inperson medical care right away today.  If you are having dizziness or severe or life-threatening symptoms please do not drive and please call 911 and seek emergency medical care.    I hope you are feeling better soon!   It was nice to talk to you you today. I help  out with telemedicine visits on Tuesdays and Thursdays and am available for visits on those days. If you have any concerns or questions following this visit please schedule a follow up visit with your Primary Care doctor or seek care at a local urgent care clinic to avoid delays in care.

## 2021-05-29 NOTE — Telephone Encounter (Signed)
Patient is scheduled for a virtual   Nurse Assessment Nurse: Vallery Sa, RN, Tye [place] Date/Time (Eastern Time): 05/29/2021 8:34:50 AM Confirm and document reason for call. If symptomatic, describe symptoms. ---Kendra Schultz states she developed cold, cough, chills, and dizziness yesterday. No severe breathing difficulty or blueness around her lips. No chest pain. Alert and responsive. (Exposed to [place] via sister) Does the patient have any new or worsening symptoms? ---Yes Will a triage be completed? ---Yes Related visit to physician within the last 2 weeks? ---No Does the PT have any chronic conditions? (i.e. diabetes, asthma, this includes High risk factors for pregnancy, etc.) ---Yes List chronic conditions. ---High Blood Pressure, Breast Cancer 1995 and 2012, Is this a behavioral health or substance abuse call? ---No Guidelines Guideline Title Affirmed Question Affirmed Notes Nurse Date/Time ([place] Time) COVID-19 - Diagnosed or Suspected MILD difficulty breathing (e.g., minimal/no SOB at rest, SOB with walking, pulse <100) [place], RN, Tye [place] 05/29/2021 8:37:38 AM PLEASE NOTE: All timestamps contained within this report are represented as [place] Standard Time. CONFIDENTIALTY NOTICE: This fax transmission is intended only for the addressee. It contains information that is legally privileged, confidential or otherwise protected from use or disclosure. If you are not the intended recipient, you are strictly prohibited from reviewing, disclosing, copying using or disseminating any of this information or taking any action in reliance on or regarding this information. If you have received this fax in error, please notify us immediately by telephone so that we can arrange for its return to [place]. Phone: [phone], Toll-Free: [phone], Fax: [phone] Page: 2 of 2 Call Id: [id number] [place]. Time [name] Time) Disposition Final User 05/29/2021 8:40:52 AM See HCP within 4 Hours (or PCP  triage) Yes Vallery Sa, RN, Rosey Bath Disagree/Comply Comply Caller Understands Yes PreDisposition Call Doctor Care Advice Given Per Guideline SEE HCP (OR PCP TRIAGE) WITHIN 4 HOURS: * IF OFFICE WILL BE OPEN: You need to be seen within the next 3 or 4 hours. Call your doctor (or NP/PA) now or as soon as the office opens. ALTERNATE DISPOSITION - CALL TELEMEDICINE DOCTOR NOW: * Telemedicine may be your best choice for care during this COVID-19 outbreak. * Fever: For fever over 101 F (38.3 C), take acetaminophen every 4 to 6 hours (Adults 650 mg) OR ibuprofen every 6 to 8 hours (Adults 400 mg). Before taking any medicine, read all the instructions on the package. Do not take aspirin unless your doctor has prescribed it for you. * Muscle aches, headache, and other pains: Often this comes and goes with the fever. Take acetaminophen every 4 to 6 hours (Adults 650 mg) OR ibuprofen every 6 to 8 hours (Adults 400 mg). Before taking any medicine, read all the instructions on the package. * Feeling dehydrated: Drink extra liquids. If the air in your home is dry, use a humidifier. GENERAL CARE ADVICE FOR COVID-19 SYMPTOMS: * The symptoms are generally treated the same whether you have COVID-19, influenza or some other respiratory virus. * Cough: Use cough drops. * Sore throat: Try throat lozenges, hard candy or warm chicken broth. CALL BACK IF: * You become worse CARE ADVICE given per COVID-19 - DIAGNOSED OR SUSPECTED (Adult) guideline. Comments User: [name], RN Date/Time [name] Time): 05/29/2021 8:43:37 AM Transferred to Andee Poles to check on virtual appointment. Referrals REFERRED TO PCP OFFICE Warm transfer to backline

## 2021-05-29 NOTE — Progress Notes (Signed)
Virtual Visit via Telephone Note  I connected with Kendra Schultz on 05/29/21 at 12:20 PM EDT by telephone and verified that I am speaking with the correct person using two identifiers.   I discussed the limitations, risks, security and privacy concerns of performing an evaluation and management service by telephone and the availability of in person appointments. I also discussed with the patient that there may be a patient responsible charge related to this service. The patient expressed understanding and agreed to proceed.  Location patient: home, [place] Location provider: work or home office Participants present for the call: patient, provider Patient did not have a visit with me in the prior 7 days to address this/these issue(s).   History of Present Illness:  Acute telemedicine visit for Covid exposures: -Onset: exposed to covid on Sunday and and symptoms started the next days -Symptoms include:cough, headache, emesis yesterday, malaise, chills, today started feeling dizzy and feels like she can not eat or drink, reports has had 1 sip of soda since yesterday.  Reports she cannot drink. -denies CP, SOB, dizziness currently -no covid vaccines listed in epic, declined covid vaccines per chart notes and unvacinated   Observations/Objective: Patient sounds cheerful and well on the phone. I do not appreciate any SOB. Speech and thought processing are grossly intact. Patient reported vitals:  Assessment and Plan:  Dizziness  Close exposure to COVID-19 virus  -we discussed possible serious and likely etiologies, options for evaluation and workup, limitations of telemedicine visit vs in person visit, treatment, treatment risks and precautions. Give dizziness, inability to tolerate oral intake, likely unvaccinated and covid exposure advised prompt inperson evaluation to assess stability, check for COVID19 vs other and tx/management. She agrees to seek inperson care today at a higher level of  care. She prefers to go to an [facility] near her house to start with and reports feels safe to drive as is not dizzy now. Advises if feels dizzy or severe symptoms and can not drive to call [phone] and seek emergency care.   Did let the patient know that I only do telemedicine shifts for [place] on Tuesdays and Thursdays and advised a follow up visit with PCP or at an [facility] if has further questions or concerns.   Follow Up Instructions:  I did not refer this patient for an OV with me in the next 24 hours for this/these issue(s).  I discussed the assessment and treatment plan with the patient. The patient was provided an opportunity to ask questions and all were answered. The patient agreed with the plan and demonstrated an understanding of the instructions.   I spent 16 minutes on the date of this visit in the care of this patient. See summary of tasks completed to properly care for this patient in the detailed notes above which also included counseling of above, review of PMH, medications, allergies, evaluation of the patient and ordering and/or  instructing patient on testing and care options.     Lucretia Kern, DO

## 2021-05-29 NOTE — Telephone Encounter (Signed)
FYI, pt scheduled with Dr. Maudie Mercury today.

## 2021-06-15 ENCOUNTER — Ambulatory Visit (INDEPENDENT_AMBULATORY_CARE_PROVIDER_SITE_OTHER): Payer: Medicare Other | Admitting: Physician Assistant

## 2021-06-15 ENCOUNTER — Encounter: Payer: Self-pay | Admitting: Physician Assistant

## 2021-06-15 ENCOUNTER — Other Ambulatory Visit: Payer: Self-pay

## 2021-06-15 VITALS — BP 144/84 | HR 68 | Temp 97.2°F | Ht 61.0 in | Wt 155.6 lb

## 2021-06-15 DIAGNOSIS — R300 Dysuria: Secondary | ICD-10-CM | POA: Diagnosis not present

## 2021-06-15 LAB — POCT URINALYSIS DIPSTICK
Bilirubin, UA: NEGATIVE
Blood, UA: POSITIVE
Glucose, UA: NEGATIVE
Ketones, UA: NEGATIVE
Nitrite, UA: NEGATIVE
Protein, UA: NEGATIVE
Spec Grav, UA: 1.02 (ref 1.010–1.025)
Urobilinogen, UA: 0.2 E.U./dL
pH, UA: 6 (ref 5.0–8.0)

## 2021-06-15 MED ORDER — NITROFURANTOIN MONOHYD MACRO 100 MG PO CAPS: 100.0000 mg | ORAL_CAPSULE | Freq: Two times a day (BID) | ORAL | 0 refills | Status: AC

## 2021-06-15 NOTE — Patient Instructions (Signed)
You may have a bladder infection. Start on Macrobid this weekend. Push fluids. I will call with culture report next week. Call sooner if any changes.

## 2021-06-15 NOTE — Progress Notes (Signed)
Acute Office Visit  Subjective:    Patient ID: Kendra Schultz, female    DOB: 1946-02-13, 75 y.o.   MRN: [id number]  Chief Complaint  Patient presents with   Dysuria    Started 1 week ago. She complains of pelvic pressure, but she denies nausea and dizziness.     Dysuria   Patient is in today for dysuria x 1 week. Also complains of pressure. She has had bladder infections previously. No hematuria, low back pain, or abdominal pain. No fever or chills. No N/V.   Past Medical History:  Diagnosis Date   Anemia    Arthritis    Cataract    surgery to remove   GERD (gastroesophageal reflux disease)    Hyperlipidemia    Hypertension    Osteoporosis     Past Surgical History:  Procedure Laterality Date   APPENDECTOMY     BREAST LUMPECTOMY  1997   right   CATARACT EXTRACTION, BILATERAL     ESOPHAGOGASTRODUODENOSCOPY     HYSTEROSCOPY WITH D & C N/A 06/01/2019   Procedure: DILATATION AND CURETTAGE /HYSTEROSCOPY;  Surgeon: Woodroe Mode, MD;  Location: [facility];  Service: Gynecology;  Laterality: N/A;   MASTECTOMY  1995   left   MASTECTOMY W/ SENTINEL NODE BIOPSY  09/17/2011   Procedure: MASTECTOMY WITH SENTINEL LYMPH NODE BIOPSY;  Surgeon: Joyice Faster. Cornett, MD;  Location: [facility];  Service: General;  Laterality: Right;  nuclear medicine injection 30 minutes prior for surgery    OVARIAN CYST REMOVAL     TONSILLECTOMY      Family History  Problem Relation Age of Onset   Heart disease Father    Cancer Father        lymphoma   Heart disease Brother    Breast cancer Sister    Breast cancer Sister    Colon cancer Neg Hx    Colon polyps Neg Hx    Esophageal cancer Neg Hx    Stomach cancer Neg Hx    Rectal cancer Neg Hx     Social History   Socioeconomic History   Marital status: Single    Spouse name: Not on file   Number of children: 2   Years of education: Not on file   Highest education level: Not on file  Occupational History   Occupation: retired  Tobacco Use    Smoking status: Former    Packs/day: 1.00    Types: Cigarettes    Quit date: 11/22/2011    Years since quitting: 9.5   Smokeless tobacco: Never  Vaping Use   Vaping Use: Never used  Substance and Sexual Activity   Alcohol use: Not Currently    Comment: occasional wine   Drug use: No   Sexual activity: Not Currently    Birth control/protection: Post-menopausal  Other Topics Concern   Not on file  Social History Narrative   Used to work at [employer]   Two sons -- live with her   Divorced/widowed   Social Determinants of Radio broadcast assistant Strain: Low Risk    Difficulty of Paying Living Expenses: Not hard at all  Food Insecurity: No Food Insecurity   Worried About Charity fundraiser in the Last Year: Never true   Arboriculturist in the Last Year: Never true  Transportation Needs: No Transportation Needs   Lack of Transportation (Medical): No   Lack of Transportation (Non-Medical): No  Physical Activity: Inactive   Days of Exercise  per Week: 0 days   Minutes of Exercise per Session: 0 min  Stress: No Stress Concern Present   Feeling of Stress : Not at all  Social Connections: Socially Isolated   Frequency of Communication with Friends and Family: Twice a week   Frequency of Social Gatherings with Friends and Family: Never   Attends Religious Services: Never   Marine scientist or Organizations: No   Attends Music therapist: Never   Marital Status: Divorced  Human resources officer Violence: Not At Risk   Fear of Current or Ex-Partner: No   Emotionally Abused: No   Physically Abused: No   Sexually Abused: No    Outpatient Medications Prior to Visit  Medication Sig Dispense Refill   albuterol (VENTOLIN HFA) 108 (90 Base) MCG/ACT inhaler INHALE 2 PUFFS BY MOUTH INTO THE LUNGS EVERY 6 HOURS AS NEEDED FOR WHEEZING 8.5 g 0   atorvastatin (LIPITOR) 20 MG tablet TAKE 1 TABLET(20 MG) BY MOUTH DAILY 90 tablet 1   benazepril-hydrochlorthiazide (LOTENSIN  HCT) 20-25 MG tablet TAKE 1 TABLET BY MOUTH DAILY 30 tablet 2   Calcium-Phosphorus-Vitamin D (CITRACAL +D3 PO) Take 2 tablets by mouth daily.     Cholecalciferol (VITAMIN D3) 50 MCG (2000 UT) TABS Take 2,000 Units by mouth daily.     ibuprofen (ADVIL,MOTRIN) 200 MG tablet Take 400 mg by mouth every 6 (six) hours as needed. For pain     omeprazole (PRILOSEC) 20 MG capsule Take 1 capsule (20 mg total) by mouth daily. 90 capsule 3   PAXLOVID, 300/100, 20 x 150 MG & 10 x 100MG TBPK Take by mouth as directed. (Patient not taking: Reported on 06/15/2021)     Facility-Administered Medications Prior to Visit  Medication Dose Route Frequency Provider Last Rate Last Admin   0.9 %  sodium chloride infusion  500 mL Intravenous Once [name], MD        Allergies  Allergen Reactions   Cephalexin Itching and Nausea And Vomiting    Review of Systems  Genitourinary:  Positive for dysuria.  REFER TO HPI FOR PERTINENT POSITIVES AND NEGATIVES     Objective:    Physical Exam Vitals and nursing note reviewed.  Constitutional:      General: She is not in acute distress.    Appearance: Normal appearance. She is not ill-appearing.  HENT:     Head: Normocephalic and atraumatic.  Cardiovascular:     Rate and Rhythm: Normal rate and regular rhythm.     Pulses: Normal pulses.     Heart sounds: Normal heart sounds.  Pulmonary:     Effort: Pulmonary effort is normal.     Breath sounds: Normal breath sounds.  Abdominal:     General: Abdomen is flat. Bowel sounds are normal.     Palpations: Abdomen is soft.     Tenderness: There is no right CVA tenderness or left CVA tenderness.  Skin:    General: Skin is warm and dry.  Neurological:     General: No focal deficit present.     Mental Status: She is alert.  Psychiatric:        Mood and Affect: Mood normal.    BP (!) 144/84   Pulse 68   Temp (!) 97.2 F (36.2 C) (Temporal)   Ht _0  (1.549 m)   Wt 155 lb 9.6 oz (70.6 kg)   LMP  (LMP  Unknown) Comment: LMP 1998  SpO2 95%   BMI 29.40 kg/m  Wt  Readings from Last 3 Encounters:  06/15/21 155 lb 9.6 oz (70.6 kg)  03/02/21 153 lb 6.4 oz (69.6 kg)  02/28/21 155 lb (70.3 kg)    Health Maintenance Due  Topic Date Due   Zoster Vaccines- Shingrix (1 of 2) Never done    There are no preventive care reminders to display for this patient.   No results found for: TSH Lab Results  Component Value Date   WBC 6.5 06/01/2019   HGB 14.1 06/01/2019   HCT 43.3 06/01/2019   MCV 87.3 06/01/2019   PLT 301 06/01/2019   Lab Results  Component Value Date   NA 141 03/02/2021   K 3.7 03/02/2021   CHLORIDE 107 05/05/2017   CO2 26 03/02/2021   GLUCOSE 108 (H) 03/02/2021   BUN 14 03/02/2021   CREATININE 0.91 03/02/2021   BILITOT 0.4 03/02/2021   ALKPHOS 64 03/02/2021   AST 17 03/02/2021   ALT 16 03/02/2021   PROT 7.1 03/02/2021   ALBUMIN 4.2 03/02/2021   CALCIUM 9.7 03/02/2021   ANIONGAP 10 06/01/2019   EGFR 68 (L) 05/05/2017   GFR 62.09 03/02/2021   Lab Results  Component Value Date   CHOL 154 03/02/2021   Lab Results  Component Value Date   HDL 43.70 03/02/2021   Lab Results  Component Value Date   LDLCALC 75 03/02/2021   Lab Results  Component Value Date   TRIG 177.0 (H) 03/02/2021   Lab Results  Component Value Date   CHOLHDL 4 03/02/2021   No results found for: HGBA1C     Assessment & Plan:   Problem List Items Addressed This Visit   None Visit Diagnoses     Dysuria    -  Primary   Relevant Orders   POCT Urinalysis Dipstick   Urine Culture       1. Dysuria Dysuria - U/A performed in office today. Will send urine for culture and treat with Macrobid. Increase water intake. May take AZO for symptomatic relief. Recheck sooner if fever, severe back pain, vomiting, or other acutely worsening symptoms.    Nur Rabold M Werner Labella, PA-C

## 2021-06-17 LAB — URINE CULTURE
MICRO NUMBER:: 12296979
SPECIMEN QUALITY:: ADEQUATE

## 2021-08-08 ENCOUNTER — Other Ambulatory Visit: Payer: Self-pay | Admitting: Nurse Practitioner

## 2021-08-17 ENCOUNTER — Other Ambulatory Visit: Payer: Self-pay | Admitting: Family Medicine

## 2021-08-17 ENCOUNTER — Other Ambulatory Visit: Payer: Self-pay | Admitting: Physician Assistant

## 2021-10-22 ENCOUNTER — Other Ambulatory Visit: Payer: Self-pay | Admitting: Physician Assistant

## 2021-11-15 ENCOUNTER — Other Ambulatory Visit: Payer: Self-pay | Admitting: Physician Assistant

## 2021-11-16 ENCOUNTER — Ambulatory Visit (INDEPENDENT_AMBULATORY_CARE_PROVIDER_SITE_OTHER): Payer: Medicare Other | Admitting: Physician Assistant

## 2021-11-16 ENCOUNTER — Encounter: Payer: Self-pay | Admitting: Physician Assistant

## 2021-11-16 ENCOUNTER — Ambulatory Visit: Payer: Medicare Other

## 2021-11-16 ENCOUNTER — Other Ambulatory Visit: Payer: Self-pay

## 2021-11-16 VITALS — BP 128/76 | HR 55 | Temp 97.7°F | Ht 61.0 in | Wt 147.0 lb

## 2021-11-16 DIAGNOSIS — R059 Cough, unspecified: Secondary | ICD-10-CM | POA: Diagnosis not present

## 2021-11-16 DIAGNOSIS — R300 Dysuria: Secondary | ICD-10-CM

## 2021-11-16 LAB — POC URINALSYSI DIPSTICK (AUTOMATED)
Bilirubin, UA: 1
Blood, UA: NEGATIVE
Glucose, UA: NEGATIVE
Ketones, UA: 5
Nitrite, UA: NEGATIVE
Protein, UA: POSITIVE — AB
Spec Grav, UA: >=1.03 — AB
Urobilinogen, UA: 0.2 U/dL
pH, UA: 5

## 2021-11-16 LAB — POC INFLUENZA A&B (BINAX/QUICKVUE)
Influenza A, POC: NEGATIVE
Influenza B, POC: NEGATIVE

## 2021-11-16 LAB — POC COVID19 BINAXNOW: SARS Coronavirus 2 Ag: NEGATIVE

## 2021-11-16 MED ORDER — AMOXICILLIN-POT CLAVULANATE 875-125 MG PO TABS: 1.0000 | ORAL_TABLET | Freq: Two times a day (BID) | ORAL | 0 refills | Status: DC

## 2021-11-16 MED ORDER — PREDNISONE 20 MG PO TABS: 40.0000 mg | ORAL_TABLET | Freq: Every day | ORAL | 0 refills | Status: DC

## 2021-11-16 MED ORDER — AZITHROMYCIN 250 MG PO TABS: ORAL_TABLET | ORAL | 0 refills | Status: AC

## 2021-11-16 NOTE — Progress Notes (Signed)
Kendra Schultz is a 76 y.o. female here for a follow up of a pre-existing problem.  History of Present Illness:   Chief Complaint  Patient presents with   Cough    Pt c/o nasal congestion clear drainage, cough expectorating clear x 1 week. Chills last week, did not take temperature.   Dysuria    Pt c/o burning with urination x 3 days, frequency and some lower back pain.    HPI  Cough One week ago she developed, cough, nasal congestion. Her son recently had COVID. She has had poor appetite. She is taking OTC cough/cold medicine without relief. Denies: SOB, chest pain, sore throat, n/v/d. Has had subjective fevers/chills. Has not taken COVID test.  Dysuria For the past 3 days has had painful urination, frequency and low back pain. She has hx of UTI and feels like one is starting. Denies blood in urine, n/v/d.  Past Medical History:  Diagnosis Date   Anemia    Arthritis    Cataract    surgery to remove   GERD (gastroesophageal reflux disease)    Hyperlipidemia    Hypertension    Osteoporosis      Social History   Tobacco Use   Smoking status: Former    Packs/day: 1.00    Types: Cigarettes    Quit date: 11/22/2011    Years since quitting: 9.9   Smokeless tobacco: Never  Vaping Use   Vaping Use: Never used  Substance Use Topics   Alcohol use: Not Currently    Comment: occasional wine   Drug use: No    Past Surgical History:  Procedure Laterality Date   APPENDECTOMY     BREAST LUMPECTOMY  1997   right   CATARACT EXTRACTION, BILATERAL     ESOPHAGOGASTRODUODENOSCOPY     HYSTEROSCOPY WITH D & C N/A 06/01/2019   Procedure: DILATATION AND CURETTAGE /HYSTEROSCOPY;  Surgeon: Woodroe Mode, MD;  Location: [place];  Service: Gynecology;  Laterality: N/A;   MASTECTOMY  1995   left   MASTECTOMY W/ SENTINEL NODE BIOPSY  09/17/2011   Procedure: MASTECTOMY WITH SENTINEL LYMPH NODE BIOPSY;  Surgeon: Joyice Faster. Cornett, MD;  Location: [facility];  Service: General;  Laterality: Right;   nuclear medicine injection 30 minutes prior for surgery    OVARIAN CYST REMOVAL     TONSILLECTOMY      Family History  Problem Relation Age of Onset   Heart disease Father    Cancer Father        lymphoma   Heart disease Brother    Breast cancer Sister    Breast cancer Sister    Colon cancer Neg Hx    Colon polyps Neg Hx    Esophageal cancer Neg Hx    Stomach cancer Neg Hx    Rectal cancer Neg Hx     Allergies  Allergen Reactions   Cephalexin Itching and Nausea And Vomiting    Current Medications:   Current Outpatient Medications:    albuterol (VENTOLIN HFA) 108 (90 Base) MCG/ACT inhaler, INHALE 2 PUFFS BY MOUTH INTO THE LUNGS EVERY 6 HOURS AS NEEDED FOR WHEEZING, Disp: 8.5 g, Rfl: 0   amoxicillin-clavulanate (AUGMENTIN) 875-125 MG tablet, Take 1 tablet by mouth 2 (two) times daily., Disp: 20 tablet, Rfl: 0   atorvastatin (LIPITOR) 20 MG tablet, TAKE 1 TABLET(20 MG) BY MOUTH DAILY, Disp: 90 tablet, Rfl: 1   azithromycin (ZITHROMAX) 250 MG tablet, Take 2 tablets on day 1, then 1 tablet daily on days  2 through 5, Disp: 6 tablet, Rfl: 0   benazepril-hydrochlorthiazide (LOTENSIN HCT) 20-25 MG tablet, TAKE 1 TABLET BY MOUTH DAILY, Disp: 30 tablet, Rfl: 2   Calcium-Phosphorus-Vitamin D (CITRACAL +D3 PO), Take 2 tablets by mouth daily., Disp: , Rfl:    Cholecalciferol (VITAMIN D3) 50 MCG (2000 UT) TABS, Take 2,000 Units by mouth daily., Disp: , Rfl:    ibuprofen (ADVIL,MOTRIN) 200 MG tablet, Take 400 mg by mouth every 6 (six) hours as needed. For pain, Disp: , Rfl:    omeprazole (PRILOSEC) 20 MG capsule, TAKE 1 CAPSULE(20 MG) BY MOUTH DAILY, Disp: 90 capsule, Rfl: 3   predniSONE (DELTASONE) 20 MG tablet, Take 2 tablets (40 mg total) by mouth daily., Disp: 10 tablet, Rfl: 0   BREO ELLIPTA 100-25 MCG/ACT AEPB, 1 puff daily. (Patient not taking: Reported on 11/16/2021), Disp: , Rfl:   Current Facility-Administered Medications:    0.9 %  sodium chloride infusion, 500 mL, Intravenous,  Once, [name], MD   Review of Systems:   ROS Negative unless otherwise specified per HPI.  Vitals:   Vitals:   11/16/21 1201  BP: 128/76  Pulse: (!) 55  Temp: 97.7 F (36.5 C)  TempSrc: Temporal  SpO2: 94%  Weight: 147 lb (66.7 kg)  Height: 5\' 1"  (1.549 m)     Body mass index is 27.78 kg/m.  Physical Exam:   Physical Exam Vitals and nursing note reviewed.  Constitutional:      General: She is not in acute distress.    Appearance: She is well-developed. She is not ill-appearing or toxic-appearing.  Cardiovascular:     Rate and Rhythm: Normal rate and regular rhythm.     Pulses: Normal pulses.     Heart sounds: Normal heart sounds, S1 normal and S2 normal.  Pulmonary:     Effort: Pulmonary effort is normal.     Breath sounds: No stridor or decreased air movement. Examination of the right-lower field reveals rales. Rales present.  Skin:    General: Skin is warm and dry.  Neurological:     Mental Status: She is alert.     GCS: GCS eye subscore is 4. GCS verbal subscore is 5. GCS motor subscore is 6.  Psychiatric:        Speech: Speech normal.        Behavior: Behavior normal. Behavior is cooperative.   Results for orders placed or performed in visit on 11/16/21  POC Influenza A&B(BINAX/QUICKVUE)  Result Value Ref Range   Influenza A, POC Negative Negative   Influenza B, POC Negative Negative  POC COVID-19  Result Value Ref Range   SARS Coronavirus 2 Ag Negative Negative  POCT Urinalysis Dipstick (Automated)  Result Value Ref Range   Color, UA yellow    Clarity, UA slightly cloudy    Glucose, UA Negative Negative   Bilirubin, UA 1    Ketones, UA 5    Spec Grav, UA >=1.030 (A) 1.010 - 1.025   Blood, UA Negative    pH, UA 5.0 5.0 - 8.0   Protein, UA Positive (A) Negative   Urobilinogen, UA 0.2 0.2 or 1.0 E.U./dL   Nitrite, UA Negative    Leukocytes, UA Small (1+) (A) Negative     Assessment and Plan:   Cough, unspecified type No red flags on  exam. Suspect possible early PNA.  Will initiate augmentin and azithromycin per orders. Prednisone also prescribed. Discussed taking medications as prescribed. Reviewed return precautions including worsening fever, SOB, worsening cough  or other concerns. Push fluids and rest. I recommend that patient follow-up if symptoms worsen or persist despite treatment x 7-10 days, sooner if needed.  Dysuria No red flags UA is concerning for possible acute cystitis Will start abx regimen above for early PNA Urine culture obtained -- will adjust treatment if indicated Follow-up if new/worsening symptoms  CMA or LPN served as scribe during this visit. History, Physical, and Plan performed by medical provider. The above documentation has been reviewed and is accurate and complete.  Inda Coke, PA-C

## 2021-11-16 NOTE — Patient Instructions (Signed)
It was great to see you!  Start two antibiotics: azithromycin and augmentin (amoxicillin-clavulanate)  Start steroid: prednisone  Push fluids  We will call you with your urine test result in a few days when we get the culture back  Let's follow-up in 1 week to recheck your lungs, sooner if you have concerns.  If any WORSENING symptoms, please go to the ER.  Take care,  Inda Coke PA-C

## 2021-11-18 LAB — URINE CULTURE
MICRO NUMBER:: 12930010
SPECIMEN QUALITY:: ADEQUATE

## 2021-11-23 ENCOUNTER — Encounter: Payer: Self-pay | Admitting: Physician Assistant

## 2021-11-23 ENCOUNTER — Ambulatory Visit (INDEPENDENT_AMBULATORY_CARE_PROVIDER_SITE_OTHER): Payer: Medicare Other | Admitting: Physician Assistant

## 2021-11-23 ENCOUNTER — Other Ambulatory Visit: Payer: Self-pay

## 2021-11-23 VITALS — BP 118/70 | HR 58 | Temp 98.2°F | Ht 61.0 in | Wt 152.5 lb

## 2021-11-23 DIAGNOSIS — R059 Cough, unspecified: Secondary | ICD-10-CM | POA: Diagnosis not present

## 2021-11-23 DIAGNOSIS — R252 Cramp and spasm: Secondary | ICD-10-CM | POA: Diagnosis not present

## 2021-11-23 DIAGNOSIS — R300 Dysuria: Secondary | ICD-10-CM

## 2021-11-23 MED ORDER — BACLOFEN 10 MG PO TABS: 10.0000 mg | ORAL_TABLET | Freq: Two times a day (BID) | ORAL | 0 refills | Status: DC | PRN

## 2021-11-23 NOTE — Patient Instructions (Signed)
It was great to see you!  Please use your Brio-Ellipta  Take baclofen muscle relaxer as needed for your cramping  If your urinary symptoms do not resolve by Monday, please follow-up with [place] next week and we will repeat your urine studies  Let's follow-up in 1 month for a physical, sooner if you have concerns.  If a referral was placed today, you will be contacted for an appointment. Please note that routine referrals can sometimes take up to 3-4 weeks to process. Please call our office if you haven't heard anything after this time frame.  Take care,  Inda Coke PA-C

## 2021-11-23 NOTE — Progress Notes (Signed)
Kendra Schultz is a 76 y.o. female here for a follow up of cough.  History of Present Illness:   Chief Complaint  Patient presents with   Cough    Pt is feeling better, some cough thinks expectorating clear sputum. Denies fever or chills. Has some body aches at times.    HPI  Cough Kendra Schultz returns to have her lungs re-checked due to suspicion of early PNA. Pt was previously seen by me on 11/16/21 with c/o cough and dysuria when I noticed the presence of rales sounds in her lungs. She was prescribed [facility]. [facility] with azithromycin, Augmentin, prednisone 20 mg BID.  She is here for 1 week follow-up.  Currently pt reports she has been compliant with taking prescribed medication with no complications. She does admit that she has not been using her Breo Ellipta 25 mg inhaler, but is open to doing so if it'll provide additional relief. Despite this she is feeling better and managing well.   Dysuria In regards to this, Kendra Schultz is currently compliant with taking augmentin 875 mg twice daily with no complications. States that she has noticed an improvement in sx but they are not fully resolved, but still has three days left of this antibiotic.   Muscle cramps Pt expresses she has been experiencing muscle cramping in her hands and legs that has been onset for at least a year. Pt is currently taking Lipitor 20 mg daily, but admits this issue was occurring prior to starting this medication. She has also tried to remain hydrated. At this time, she would like to trial a medication that could provide relief.   Past Medical History:  Diagnosis Date   Anemia    Arthritis    Cataract    surgery to remove   GERD (gastroesophageal reflux disease)    Hyperlipidemia    Hypertension    Osteoporosis      Social History   Tobacco Use   Smoking status: Former    Packs/day: 1.00    Types: Cigarettes    Quit date: 11/22/2011    Years since quitting: 10.0   Smokeless tobacco: Never  Vaping Use   Vaping  Use: Never used  Substance Use Topics   Alcohol use: Not Currently    Comment: occasional wine   Drug use: No    Past Surgical History:  Procedure Laterality Date   APPENDECTOMY     BREAST LUMPECTOMY  1997   right   CATARACT EXTRACTION, BILATERAL     ESOPHAGOGASTRODUODENOSCOPY     HYSTEROSCOPY WITH D & C N/A 06/01/2019   Procedure: DILATATION AND CURETTAGE /HYSTEROSCOPY;  Surgeon: Woodroe Mode, MD;  Location: [place];  Service: Gynecology;  Laterality: N/A;   MASTECTOMY  1995   left   MASTECTOMY W/ SENTINEL NODE BIOPSY  09/17/2011   Procedure: MASTECTOMY WITH SENTINEL LYMPH NODE BIOPSY;  Surgeon: Joyice Faster. Cornett, MD;  Location: [place];  Service: General;  Laterality: Right;  nuclear medicine injection 30 minutes prior for surgery    OVARIAN CYST REMOVAL     TONSILLECTOMY      Family History  Problem Relation Age of Onset   Heart disease Father    Cancer Father        lymphoma   Heart disease Brother    Breast cancer Sister    Breast cancer Sister    Colon cancer Neg Hx    Colon polyps Neg Hx    Esophageal cancer Neg Hx    Stomach cancer Neg  Hx    Rectal cancer Neg Hx     Allergies  Allergen Reactions   Cephalexin Itching and Nausea And Vomiting    Current Medications:   Current Outpatient Medications:    albuterol (VENTOLIN HFA) 108 (90 Base) MCG/ACT inhaler, INHALE 2 PUFFS BY MOUTH INTO THE LUNGS EVERY 6 HOURS AS NEEDED FOR WHEEZING, Disp: 8.5 g, Rfl: 0   amoxicillin-clavulanate (AUGMENTIN) 875-125 MG tablet, Take 1 tablet by mouth 2 (two) times daily., Disp: 20 tablet, Rfl: 0   atorvastatin (LIPITOR) 20 MG tablet, TAKE 1 TABLET(20 MG) BY MOUTH DAILY, Disp: 90 tablet, Rfl: 1   benazepril-hydrochlorthiazide (LOTENSIN HCT) 20-25 MG tablet, TAKE 1 TABLET BY MOUTH DAILY, Disp: 30 tablet, Rfl: 2   Calcium-Phosphorus-Vitamin D (CITRACAL +D3 PO), Take 2 tablets by mouth daily., Disp: , Rfl:    Cholecalciferol (VITAMIN D3) 50 MCG (2000 UT) TABS, Take 2,000 Units by mouth  daily., Disp: , Rfl:    ibuprofen (ADVIL,MOTRIN) 200 MG tablet, Take 400 mg by mouth every 6 (six) hours as needed. For pain, Disp: , Rfl:    omeprazole (PRILOSEC) 20 MG capsule, TAKE 1 CAPSULE(20 MG) BY MOUTH DAILY, Disp: 90 capsule, Rfl: 3   BREO ELLIPTA 100-25 MCG/ACT AEPB, 1 puff daily. (Patient not taking: Reported on 11/16/2021), Disp: , Rfl:   Current Facility-Administered Medications:    0.9 %  sodium chloride infusion, 500 mL, Intravenous, Once, [name], MD   Review of Systems:   ROS Negative unless otherwise specified per HPI.  Vitals:   Vitals:   11/23/21 0956  BP: 118/70  Pulse: (!) 58  Temp: 98.2 F (36.8 C)  TempSrc: Temporal  SpO2: 94%  Weight: 152 lb 8 oz (69.2 kg)  Height: 5\' 1"  (1.549 m)     Body mass index is 28.81 kg/m.  Physical Exam:   Physical Exam Vitals and nursing note reviewed.  Constitutional:      General: She is not in acute distress.    Appearance: She is well-developed. She is not ill-appearing or toxic-appearing.  Cardiovascular:     Rate and Rhythm: Normal rate and regular rhythm.     Pulses: Normal pulses.     Heart sounds: Normal heart sounds, S1 normal and S2 normal.  Pulmonary:     Effort: Pulmonary effort is normal.     Breath sounds: Normal breath sounds.  Musculoskeletal:     Comments: No obvious swelling or tenderness to bilateral legs  Skin:    General: Skin is warm and dry.  Neurological:     Mental Status: She is alert.     GCS: GCS eye subscore is 4. GCS verbal subscore is 5. GCS motor subscore is 6.     Comments: Normal grip strength and range of motion in hands  Psychiatric:        Speech: Speech normal.        Behavior: Behavior normal. Behavior is cooperative.    Assessment and Plan:   Cough, unspecified type Improving Reminded patient to use her Breo-Ellipta 25 mcg inhaler, 1 puff daily  Follow up if new/worsening symptoms or concerns occur  Dysuria Improving Complete oral Augmentin  regimen Follow up if symptoms do not completely improve by Monday, asked patient to follow-up in our office and we will repeat her urine studies  Muscle spasms No red flags  Start baclofen 10 mg twice daily as needed --sleepy precautions discussed Encouraged patient to drink at least 64 ounces of water daily  Follow up in  1 month for a physical, sooner if concerns We will do blood work at that time  [employer] as a Education administrator for [employer], PA.,have documented all relevant documentation on the behalf of Inda Coke, PA,as directed by  Inda Coke, PA while in the presence of Inda Coke, [place].  I, Inda Coke, [place], have reviewed all documentation for this visit. The documentation on 11/23/21 for the exam, diagnosis, procedures, and orders are all accurate and complete.   Inda Coke, PA-C

## 2022-01-11 ENCOUNTER — Other Ambulatory Visit: Payer: Self-pay

## 2022-01-11 ENCOUNTER — Ambulatory Visit (INDEPENDENT_AMBULATORY_CARE_PROVIDER_SITE_OTHER): Payer: Medicare Other

## 2022-01-11 DIAGNOSIS — Z0000 Encounter for general adult medical examination without abnormal findings: Secondary | ICD-10-CM

## 2022-01-11 NOTE — Progress Notes (Addendum)
Virtual Visit via Telephone Note ? ?I connected with  Kendra Schultz on 01/11/22 at  9:30 AM EDT by telephone and verified that I am speaking with the correct person using two identifiers. ? ?Medicare Annual Wellness visit completed telephonically due to Covid-19 pandemic.  ? ?Persons participating in this call: This Health Coach and this patient.  ? ?Location: ?Patient: Home ?Provider: Office  ?  ?I discussed the limitations, risks, security and privacy concerns of performing an evaluation and management service by telephone and the availability of in person appointments. The patient expressed understanding and agreed to proceed. ? ?Unable to perform video visit due to video visit attempted and failed and/or patient does not have video capability.  ? ?Some vital signs may be absent or patient reported.  ? ?Willette Brace, LPN ? ? ?Subjective:  ? Kendra Schultz is a 76 y.o. female who presents for Medicare Annual (Subsequent) preventive examination. ? ?Review of Systems    ? ?Cardiac Risk Factors include: advanced age (>52mn, >>35women);hypertension;dyslipidemia ? ?   ?Objective:  ?  ?Today's Vitals  ? 01/11/22 0924  ?PainSc: 0-No pain  ? ?There is no height or weight on file to calculate BMI. ? ? ?  01/11/2022  ?  9:28 AM 11/10/2020  ? 11:54 AM 06/01/2019  ?  6:02 AM 05/05/2017  ? 12:00 PM 11/04/2016  ?  2:04 PM 07/10/2016  ? 10:22 AM 03/20/2016  ? 11:37 AM  ?Advanced Directives  ?Does Patient Have a Medical Advance Directive? No No No No No No No  ?Would patient like information on creating a medical advance directive? No - Patient declined No - Patient declined No - Patient declined   No - patient declined information No - patient declined information  ? ? ?Current Medications (verified) ?Outpatient Encounter Medications as of 01/11/2022  ?Medication Sig  ? albuterol (VENTOLIN HFA) 108 (90 Base) MCG/ACT inhaler INHALE 2 PUFFS BY MOUTH INTO THE LUNGS EVERY 6 HOURS AS NEEDED FOR WHEEZING  ? atorvastatin (LIPITOR) 20 MG  tablet TAKE 1 TABLET(20 MG) BY MOUTH DAILY  ? baclofen (LIORESAL) 10 MG tablet Take 1 tablet (10 mg total) by mouth 2 (two) times daily as needed for muscle spasms.  ? benazepril-hydrochlorthiazide (LOTENSIN HCT) 20-25 MG tablet TAKE 1 TABLET BY MOUTH DAILY  ? BREO ELLIPTA 100-25 MCG/ACT AEPB 1 puff daily.  ? Calcium-Phosphorus-Vitamin D (CITRACAL +D3 PO) Take 2 tablets by mouth daily.  ? Cholecalciferol (VITAMIN D3) 50 MCG (2000 UT) TABS Take 2,000 Units by mouth daily.  ? ibuprofen (ADVIL,MOTRIN) 200 MG tablet Take 400 mg by mouth every 6 (six) hours as needed. For pain  ? omeprazole (PRILOSEC) 20 MG capsule TAKE 1 CAPSULE(20 MG) BY MOUTH DAILY  ? [DISCONTINUED] amoxicillin-clavulanate (AUGMENTIN) 875-125 MG tablet Take 1 tablet by mouth 2 (two) times daily.  ? ?Facility-Administered Encounter Medications as of 01/11/2022  ?Medication  ? 0.9 %  sodium chloride infusion  ? ? ?Allergies (verified) ?Cephalexin  ? ?History: ?Past Medical History:  ?Diagnosis Date  ? Anemia   ? Arthritis   ? Cataract   ? surgery to remove  ? GERD (gastroesophageal reflux disease)   ? Hyperlipidemia   ? Hypertension   ? Osteoporosis   ? ?Past Surgical History:  ?Procedure Laterality Date  ? APPENDECTOMY    ? BREAST LUMPECTOMY  1997  ? right  ? CATARACT EXTRACTION, BILATERAL    ? ESOPHAGOGASTRODUODENOSCOPY    ? HYSTEROSCOPY WITH D & C N/A 06/01/2019  ? Procedure:  DILATATION AND CURETTAGE /HYSTEROSCOPY;  Surgeon: Woodroe Mode, MD;  Location: [place];  Service: Gynecology;  Laterality: N/A;  ? MASTECTOMY  1995  ? left  ? MASTECTOMY W/ SENTINEL NODE BIOPSY  09/17/2011  ? Procedure: MASTECTOMY WITH SENTINEL LYMPH NODE BIOPSY;  Surgeon: Joyice Faster. Cornett, MD;  Location: [place];  Service: General;  Laterality: Right;  nuclear medicine injection 30 minutes prior for surgery   ? OVARIAN CYST REMOVAL    ? TONSILLECTOMY    ? ?Family History  ?Problem Relation Age of Onset  ? Heart disease Father   ? Cancer Father   ?     lymphoma  ? Heart disease  Brother   ? Breast cancer Sister   ? Breast cancer Sister   ? Colon cancer Neg Hx   ? Colon polyps Neg Hx   ? Esophageal cancer Neg Hx   ? Stomach cancer Neg Hx   ? Rectal cancer Neg Hx   ? ?Social History  ? ?Socioeconomic History  ? Marital status: Single  ?  Spouse name: Not on file  ? Number of children: 2  ? Years of education: Not on file  ? Highest education level: Not on file  ?Occupational History  ? Occupation: retired  ?Tobacco Use  ? Smoking status: Former  ?  Packs/day: 1.00  ?  Types: Cigarettes  ?  Quit date: 11/22/2011  ?  Years since quitting: 10.1  ? Smokeless tobacco: Never  ?Vaping Use  ? Vaping Use: Never used  ?Substance and Sexual Activity  ? Alcohol use: Not Currently  ?  Comment: occasional wine  ? Drug use: No  ? Sexual activity: Not Currently  ?  Birth control/protection: Post-menopausal  ?Other Topics Concern  ? Not on file  ?Social History Narrative  ? Used to work at [employer]  ? Two sons -- live with her  ? Divorced/widowed  ? ?Social Determinants of Health  ? ?Financial Resource Strain: Low Risk   ? Difficulty of Paying Living Expenses: Not hard at all  ?Food Insecurity: No Food Insecurity  ? Worried About Charity fundraiser in the Last Year: Never true  ? Ran Out of Food in the Last Year: Never true  ?Transportation Needs: No Transportation Needs  ? Lack of Transportation (Medical): No  ? Lack of Transportation (Non-Medical): No  ?Physical Activity: Inactive  ? Days of Exercise per Week: 0 days  ? Minutes of Exercise per Session: 0 min  ?Stress: No Stress Concern Present  ? Feeling of Stress : Not at all  ?Social Connections: Socially Isolated  ? Frequency of Communication with Friends and Family: More than three times a week  ? Frequency of Social Gatherings with Friends and Family: More than three times a week  ? Attends Religious Services: Never  ? Active Member of Clubs or Organizations: No  ? Attends Archivist Meetings: Never  ? Marital Status: Divorced   ? ? ?Tobacco Counseling ?Counseling given: Not Answered ? ? ?Clinical Intake: ? ?Pre-visit preparation completed: Yes ? ?Pain : No/denies pain ?Pain Score: 0-No pain ? ?  ? ?BMI - recorded: 28.83 ?Nutritional Status: BMI 25 -29 Overweight ?Nutritional Risks: None ?Diabetes: No ? ?How often do you need to have someone help you when you read instructions, pamphlets, or other written materials from your doctor or pharmacy?: 1 - Never ? ?Diabetic?no ? ?Interpreter Needed?: No ? ?Information entered by :: Charlott Rakes, LPN ? ? ?Activities of Daily Living ? ?  01/11/2022  ?  9:29 AM  ?In your present state of health, do you have any difficulty performing the following activities:  ?Hearing? 0  ?Vision? 0  ?Difficulty concentrating or making decisions? 0  ?Walking or climbing stairs? 0  ?Dressing or bathing? 0  ?Doing errands, shopping? 0  ?Preparing Food and eating ? N  ?Using the Toilet? N  ?In the past six months, have you accidently leaked urine? Y  ?Comment wears a brief  ?Do you have problems with loss of bowel control? N  ?Managing your Medications? N  ?Managing your Finances? N  ?Housekeeping or managing your Housekeeping? N  ? ? ?Patient Care Team: ?Inda Coke, [place] as PCP - General (Physician Assistant) ? ?Indicate any recent Medical Services you may have received from other than [facility] providers in the past year (date may be approximate). ? ?   ?Assessment:  ? This is a routine wellness examination for [facility]. ? ?Hearing/Vision screen ?Hearing Screening - Comments:: Pt denies any hearing issues  ?V[facility] Screening - Comments:: [facility]  ? ?Dietary issues and exercise activities discussed: ?Current Exercise Habits: The patient does not participate in regular exercise at present ? ? Goals Addressed   ? ?  ?  ?  ?  ? This Visit's Progress  ?  Patient Stated     ?  None at this time  ?  ? ?  ? ?Depression Screen ? ?  01/11/2022  ?  9:27 AM 06/15/2021  ?  1:47 PM 11/10/2020  ? 11:52  AM 02/25/2020  ? 10:29 AM  ?PHQ 2/9 Scores  ?PHQ - 2 Score 0 0 0 0  ?  ?Fall Risk ? ?  01/11/2022  ?  9:28 AM 11/10/2020  ? 11:55 AM 02/25/2020  ? 10:29 AM  ?Fall Risk   ?Falls in the past year? 0 0 0  ?Number falls in

## 2022-01-11 NOTE — Patient Instructions (Signed)
..Kendra Schultz , ?Thank you for taking time to come for your Medicare Wellness Visit. I appreciate your ongoing commitment to your health goals. Please review the following plan we discussed and let me know if I can assist you in the future.  ? ?Screening recommendations/referrals: ?Colonoscopy: No longer required  ?Bone Density: Done 09/12/20 repeat every 2 years  ?Recommended yearly ophthalmology/optometry visit for glaucoma screening and checkup ?Recommended yearly dental visit for hygiene and checkup ? ?Vaccinations: ?Influenza vaccine: Declined  ?Pneumococcal vaccine: due  ?Tdap vaccine: Due  ?Shingles vaccine: Shingrix discussed. Please contact your pharmacy for coverage information.    ?Covid-19:Declined  ? ?Advanced directives: Advance directive discussed with you today. Even though you declined this today please call our office should you change your mind and we can give you the proper paperwork for you to fill out. ? ?Conditions/risks identified: None at this time  ? ?Next appointment: Follow up in one year for your annual wellness visit  ? ? ?Preventive Care 38 Years and Older, Female ?Preventive care refers to lifestyle choices and visits with your health care provider that can promote health and wellness. ?What does preventive care include? ?A yearly physical exam. This is also called an annual well check. ?Dental exams once or twice a year. ?Routine eye exams. Ask your health care provider how often you should have your eyes checked. ?Personal lifestyle choices, including: ?Daily care of your teeth and gums. ?Regular physical activity. ?Eating a healthy diet. ?Avoiding tobacco and drug use. ?Limiting alcohol use. ?Practicing safe sex. ?Taking low-dose aspirin every day. ?Taking vitamin and mineral supplements as recommended by your health care provider. ?What happens during an annual well check? ?The services and screenings done by your health care provider during your annual well check will depend on  your age, overall health, lifestyle risk factors, and family history of disease. ?Counseling  ?Your health care provider may ask you questions about your: ?Alcohol use. ?Tobacco use. ?Drug use. ?Emotional well-being. ?Home and relationship well-being. ?Sexual activity. ?Eating habits. ?History of falls. ?Memory and ability to understand (cognition). ?Work and work Statistician. ?Reproductive health. ?Screening  ?You may have the following tests or measurements: ?Height, weight, and BMI. ?Blood pressure. ?Lipid and cholesterol levels. These may be checked every 5 years, or more frequently if you are over 20 years old. ?Skin check. ?Lung cancer screening. You may have this screening every year starting at age 13 if you have a 30-pack-year history of smoking and currently smoke or have quit within the past 15 years. ?Fecal occult blood test (FOBT) of the stool. You may have this test every year starting at age 25. ?Flexible sigmoidoscopy or colonoscopy. You may have a sigmoidoscopy every 5 years or a colonoscopy every 10 years starting at age 57. ?Hepatitis C blood test. ?Hepatitis B blood test. ?Sexually transmitted disease (STD) testing. ?Diabetes screening. This is done by checking your blood sugar (glucose) after you have not eaten for a while (fasting). You may have this done every 1-3 years. ?Bone density scan. This is done to screen for osteoporosis. You may have this done starting at age 71. ?Mammogram. This may be done every 1-2 years. Talk to your health care provider about how often you should have regular mammograms. ?Talk with your health care provider about your test results, treatment options, and if necessary, the need for more tests. ?Vaccines  ?Your health care provider may recommend certain vaccines, such as: ?Influenza vaccine. This is recommended every year. ?Tetanus, diphtheria, and acellular  pertussis (Tdap, Td) vaccine. You may need a Td booster every 10 years. ?Zoster vaccine. You may need this  after age 50. ?Pneumococcal 13-valent conjugate (PCV13) vaccine. One dose is recommended after age 67. ?Pneumococcal polysaccharide (PPSV23) vaccine. One dose is recommended after age 32. ?Talk to your health care provider about which screenings and vaccines you need and how often you need them. ?This information is not intended to replace advice given to you by your health care provider. Make sure you discuss any questions you have with your health care provider. ?Document Released: 11/03/2015 Document Revised: 06/26/2016 Document Reviewed: 08/08/2015 ?Elsevier Interactive Patient Education ? 2017 [place]. ? ?Fall Prevention in the Home ?Falls can cause injuries. They can happen to people of all ages. There are many things you can do to make your home safe and to help prevent falls. ?What can I do on the outside of my home? ?Regularly fix the edges of walkways and driveways and fix any cracks. ?Remove anything that might make you trip as you walk through a door, such as a raised step or threshold. ?Trim any bushes or trees on the path to your home. ?Use bright outdoor lighting. ?Clear any walking paths of anything that might make someone trip, such as rocks or tools. ?Regularly check to see if handrails are loose or broken. Make sure that both sides of any steps have handrails. ?Any raised decks and porches should have guardrails on the edges. ?Have any leaves, snow, or ice cleared regularly. ?Use sand or salt on walking paths during winter. ?Clean up any spills in your garage right away. This includes oil or grease spills. ?What can I do in the bathroom? ?Use night lights. ?Install grab bars by the toilet and in the tub and shower. Do not use towel bars as grab bars. ?Use non-skid mats or decals in the tub or shower. ?If you need to sit down in the shower, use a plastic, non-slip stool. ?Keep the floor dry. Clean up any water that spills on the floor as soon as it happens. ?Remove soap buildup in the tub or  shower regularly. ?Attach bath mats securely with double-sided non-slip rug tape. ?Do not have throw rugs and other things on the floor that can make you trip. ?What can I do in the bedroom? ?Use night lights. ?Make sure that you have a light by your bed that is easy to reach. ?Do not use any sheets or blankets that are too big for your bed. They should not hang down onto the floor. ?Have a firm chair that has side arms. You can use this for support while you get dressed. ?Do not have throw rugs and other things on the floor that can make you trip. ?What can I do in the kitchen? ?Clean up any spills right away. ?Avoid walking on wet floors. ?Keep items that you use a lot in easy-to-reach places. ?If you need to reach something above you, use a strong step stool that has a grab bar. ?Keep electrical cords out of the way. ?Do not use floor polish or wax that makes floors slippery. If you must use wax, use non-skid floor wax. ?Do not have throw rugs and other things on the floor that can make you trip. ?What can I do with my stairs? ?Do not leave any items on the stairs. ?Make sure that there are handrails on both sides of the stairs and use them. Fix handrails that are broken or loose. Make sure that handrails  are as long as the stairways. ?Check any carpeting to make sure that it is firmly attached to the stairs. Fix any carpet that is loose or worn. ?Avoid having throw rugs at the top or bottom of the stairs. If you do have throw rugs, attach them to the floor with carpet tape. ?Make sure that you have a light switch at the top of the stairs and the bottom of the stairs. If you do not have them, ask someone to add them for you. ?What else can I do to help prevent falls? ?Wear shoes that: ?Do not have high heels. ?Have rubber bottoms. ?Are comfortable and fit you well. ?Are closed at the toe. Do not wear sandals. ?If you use a stepladder: ?Make sure that it is fully opened. Do not climb a closed stepladder. ?Make  sure that both sides of the stepladder are locked into place. ?Ask someone to hold it for you, if possible. ?Clearly mark and make sure that you can see: ?Any grab bars or handrails. ?First and last steps. ?

## 2022-02-12 ENCOUNTER — Encounter: Payer: Self-pay | Admitting: Physician Assistant

## 2022-02-12 ENCOUNTER — Ambulatory Visit (INDEPENDENT_AMBULATORY_CARE_PROVIDER_SITE_OTHER): Payer: Medicare Other | Admitting: Physician Assistant

## 2022-02-12 VITALS — BP 110/70 | HR 72 | Temp 98.2°F | Ht 61.0 in | Wt 150.4 lb

## 2022-02-12 DIAGNOSIS — B0229 Other postherpetic nervous system involvement: Secondary | ICD-10-CM | POA: Diagnosis not present

## 2022-02-12 MED ORDER — GABAPENTIN 100 MG PO CAPS: ORAL_CAPSULE | ORAL | 1 refills | Status: DC

## 2022-02-12 MED ORDER — PREDNISONE 20 MG PO TABS: 40.0000 mg | ORAL_TABLET | Freq: Every day | ORAL | 0 refills | Status: DC

## 2022-02-12 NOTE — Patient Instructions (Signed)
It was great to see you! ? ?I have sent in prednisone - take this first ? ?After this completed, then you may start the gabapentin as directed. ? ?Follow-up with me if pain is still problematic. ? ?Take care, ? ?Inda Coke PA-C  ?

## 2022-02-12 NOTE — Progress Notes (Signed)
Kendra Schultz is a 76 y.o. female here for shingles ? ?History of Present Illness:  ? ?Chief Complaint  ?Patient presents with  ? Herpes Zoster  ?  Pt is c/o shingles spreading, having pain right side of face, ear and head. Pt was treated on 4/15.  ? ? ?HPI ? ?Herpes Zoster  ?On 02/02/22, pt presented to the [facility] with c/o painful rash on the right side of her face that had been onset for 2 days. Stated at the time that although the rash was pruritic it was bearable. Upon further discussion she also admitted that she had been experiencing a headache and sinus congestion, but wasn't sure if this was related. After further evaluation of sx, pt was found to have herpes zoster. As a result she was prescribed acyclovir 800 mg five times daily x 7 days. Although she was compliant with the medication she was still experiencing pain and spreading of her shingles which led to her visit today.  ? ?Currently reports that her pain in focused on the right side of her head, worse on the side of her eye and behind her right ear. In an effort to manage her pain she has tried taking ibuprofen, tylenol and her sister's hydrocodone but nothing provided her with any relief. She has visited her eye doctor between visits and was told there was no sign of shingles in her right eye. At this time she is looking for any possible relief that can be provided. Denies any vision changes. ? ? ?Past Medical History:  ?Diagnosis Date  ? Anemia   ? Arthritis   ? Cataract   ? surgery to remove  ? GERD (gastroesophageal reflux disease)   ? Hyperlipidemia   ? Hypertension   ? Osteoporosis   ? ?  ?Social History  ? ?Tobacco Use  ? Smoking status: Former  ?  Packs/day: 1.00  ?  Types: Cigarettes  ?  Quit date: 11/22/2011  ?  Years since quitting: 10.2  ? Smokeless tobacco: Never  ?Vaping Use  ? Vaping Use: Never used  ?Substance Use Topics  ? Alcohol use: Not Currently  ?  Comment: occasional wine  ? Drug use: No  ? ? ?Past Surgical History:  ?Procedure  Laterality Date  ? APPENDECTOMY    ? BREAST LUMPECTOMY  1997  ? right  ? CATARACT EXTRACTION, BILATERAL    ? ESOPHAGOGASTRODUODENOSCOPY    ? HYSTEROSCOPY WITH D & C N/A 06/01/2019  ? Procedure: DILATATION AND CURETTAGE /HYSTEROSCOPY;  Surgeon: Woodroe Mode, MD;  Location: [facility];  Service: Gynecology;  Laterality: N/A;  ? MASTECTOMY  1995  ? left  ? MASTECTOMY W/ SENTINEL NODE BIOPSY  09/17/2011  ? Procedure: MASTECTOMY WITH SENTINEL LYMPH NODE BIOPSY;  Surgeon: Joyice Faster. Cornett, MD;  Location: [facility];  Service: General;  Laterality: Right;  nuclear medicine injection 30 minutes prior for surgery   ? OVARIAN CYST REMOVAL    ? TONSILLECTOMY    ? ? ?Family History  ?Problem Relation Age of Onset  ? Heart disease Father   ? Cancer Father   ?     lymphoma  ? Heart disease Brother   ? Breast cancer Sister   ? Breast cancer Sister   ? Colon cancer Neg Hx   ? Colon polyps Neg Hx   ? Esophageal cancer Neg Hx   ? Stomach cancer Neg Hx   ? Rectal cancer Neg Hx   ? ? ?Allergies  ?Allergen Reactions  ?  Cephalexin Itching and Nausea And Vomiting  ? ? ?Current Medications:  ? ?Current Outpatient Medications:  ?  albuterol (VENTOLIN HFA) 108 (90 Base) MCG/ACT inhaler, INHALE 2 PUFFS BY MOUTH INTO THE LUNGS EVERY 6 HOURS AS NEEDED FOR WHEEZING, Disp: 8.5 g, Rfl: 0 ?  atorvastatin (LIPITOR) 20 MG tablet, TAKE 1 TABLET(20 MG) BY MOUTH DAILY, Disp: 90 tablet, Rfl: 1 ?  baclofen (LIORESAL) 10 MG tablet, Take 1 tablet (10 mg total) by mouth 2 (two) times daily as needed for muscle spasms., Disp: 30 each, Rfl: 0 ?  benazepril-hydrochlorthiazide (LOTENSIN HCT) 20-25 MG tablet, TAKE 1 TABLET BY MOUTH DAILY, Disp: 30 tablet, Rfl: 2 ?  BREO ELLIPTA 100-25 MCG/ACT AEPB, 1 puff daily., Disp: , Rfl:  ?  Calcium-Phosphorus-Vitamin D (CITRACAL +D3 PO), Take 2 tablets by mouth daily., Disp: , Rfl:  ?  Cholecalciferol (VITAMIN D3) 50 MCG (2000 UT) TABS, Take 2,000 Units by mouth daily., Disp: , Rfl:  ?  ibuprofen (ADVIL,MOTRIN) 200 MG tablet, Take  400 mg by mouth every 6 (six) hours as needed. For pain, Disp: , Rfl:  ?  omeprazole (PRILOSEC) 20 MG capsule, TAKE 1 CAPSULE(20 MG) BY MOUTH DAILY, Disp: 90 capsule, Rfl: 3 ? ?Current Facility-Administered Medications:  ?  0.9 %  sodium chloride infusion, 500 mL, Intravenous, Once, [name], MD  ? ?Review of Systems:  ? ?ROS ?Negative unless otherwise specified per HPI. ?Vitals:  ? ?Vitals:  ? 02/12/22 1010  ?BP: 110/70  ?Pulse: 72  ?Temp: 98.2 ?F (36.8 ?C)  ?TempSrc: Temporal  ?SpO2: 95%  ?Weight: 150 lb 6.1 oz (68.2 kg)  ?Height: '5\' 1"'$  (1.549 m)  ?   ?Body mass index is 28.41 kg/m?. ? ?Physical Exam:  ? ?Physical Exam ?Vitals and nursing note reviewed.  ?Constitutional:   ?   General: She is not in acute distress. ?   Appearance: She is well-developed. She is not ill-appearing or toxic-appearing.  ?Cardiovascular:  ?   Rate and Rhythm: Normal rate and regular rhythm.  ?   Pulses: Normal pulses.  ?   Heart sounds: Normal heart sounds, S1 normal and S2 normal.  ?Pulmonary:  ?   Effort: Pulmonary effort is normal.  ?   Breath sounds: Normal breath sounds.  ?Skin: ?   General: Skin is warm and dry.  ?   Comments: No visible lesions on auricle or in canal  ?Neurological:  ?   Mental Status: She is alert.  ?   GCS: GCS eye subscore is 4. GCS verbal subscore is 5. GCS motor subscore is 6.  ?Psychiatric:     ?   Speech: Speech normal.     ?   Behavior: Behavior normal. Behavior is cooperative.  ? ? ?Assessment and Plan:  ? ?Herpes Zoster ?No red flags ?Start prednisone 40 mg daily x 5 days  ?Informed patient that following completion of prednisone to start gabapentin 100 mg three times daily ?She was instructed that if she develops any visual symptoms to immediately proceed to an eye doctor or inform us ?May increase by 100 mg daily as tolerated up to 300 mg three times daily ?Advised patient that if worsening pain/lack of improvement of symptoms within a week of starting gabapentin to reach out and medication  will be adjusted  ? ? ?I,Havlyn C Ratchford,acting as a scribe for [employer], PA.,have documented all relevant documentation on the behalf of Inda Coke, PA,as directed by  Inda Coke, PA while in the presence of  Inda Coke, [place]. ? ?IInda Coke, PA, have reviewed all documentation for this visit. The documentation on 02/12/22 for the exam, diagnosis, procedures, and orders are all accurate and complete. ? ? ?Inda Coke, PA-C ? ?

## 2022-02-13 ENCOUNTER — Other Ambulatory Visit: Payer: Self-pay | Admitting: Physician Assistant

## 2022-04-04 ENCOUNTER — Other Ambulatory Visit: Payer: Self-pay | Admitting: *Deleted

## 2022-04-04 MED ORDER — BACLOFEN 10 MG PO TABS: 10.0000 mg | ORAL_TABLET | Freq: Two times a day (BID) | ORAL | 0 refills | Status: DC | PRN

## 2022-06-11 ENCOUNTER — Other Ambulatory Visit: Payer: Self-pay | Admitting: *Deleted

## 2022-06-11 MED ORDER — BENAZEPRIL-HYDROCHLOROTHIAZIDE 20-25 MG PO TABS: 1.0000 | ORAL_TABLET | Freq: Every day | ORAL | 2 refills | Status: DC

## 2022-09-23 ENCOUNTER — Other Ambulatory Visit: Payer: Self-pay | Admitting: Physician Assistant

## 2022-10-17 ENCOUNTER — Other Ambulatory Visit: Payer: Self-pay | Admitting: Physician Assistant

## 2022-12-06 ENCOUNTER — Other Ambulatory Visit: Payer: Self-pay | Admitting: Physician Assistant

## 2023-01-06 ENCOUNTER — Telehealth: Payer: Self-pay | Admitting: Physician Assistant

## 2023-01-06 NOTE — Telephone Encounter (Signed)
Contacted Arleni Kwiatek to schedule their annual wellness visit. Appointment made for 01/14/2023.  [address] Direct Dial [phone]

## 2023-01-14 ENCOUNTER — Ambulatory Visit (INDEPENDENT_AMBULATORY_CARE_PROVIDER_SITE_OTHER): Payer: 59

## 2023-01-14 ENCOUNTER — Other Ambulatory Visit: Payer: Self-pay | Admitting: Physician Assistant

## 2023-01-14 VITALS — Wt 150.0 lb

## 2023-01-14 DIAGNOSIS — Z0000 Encounter for general adult medical examination without abnormal findings: Secondary | ICD-10-CM

## 2023-01-14 NOTE — Patient Instructions (Signed)
Kendra Schultz , Thank you for taking time to come for your Medicare Wellness Visit. I appreciate your ongoing commitment to your health goals. Please review the following plan we discussed and let me know if I can assist you in the future.   These are the goals we discussed:  Goals      Patient Stated     Stay healthy     Patient Stated     None at this time      Patient Stated     None at this time         This is a list of the screening recommended for you and due dates:  Health Maintenance  Topic Date Due   DTaP/Tdap/Td vaccine (1 - Tdap) Never done   Zoster (Shingles) Vaccine (1 of 2) Never done   Pneumonia Vaccine (2 of 2 - PPSV23 or PCV20) 09/01/2021   Medicare Annual Wellness Visit  01/14/2024   DEXA scan (bone density measurement)  Completed   Hepatitis C Screening: USPSTF Recommendation to screen - Ages 66-79 yo.  Completed   HPV Vaccine  Aged Out   Flu Shot  Discontinued   Colon Cancer Screening  Discontinued   COVID-19 Vaccine  Discontinued    Advanced directives: Advance directive discussed with you today. Even though you declined this today please call our office should you change your mind and we can give you the proper paperwork for you to fill out.  Conditions/risks identified: none at this time  Next appointment: Follow up in one year for your annual wellness visit    Preventive Care 65 Years and Older, Female Preventive care refers to lifestyle choices and visits with your health care provider that can promote health and wellness. What does preventive care include? A yearly physical exam. This is also called an annual well check. Dental exams once or twice a year. Routine eye exams. Ask your health care provider how often you should have your eyes checked. Personal lifestyle choices, including: Daily care of your teeth and gums. Regular physical activity. Eating a healthy diet. Avoiding tobacco and drug use. Limiting alcohol use. Practicing safe  sex. Taking low-dose aspirin every day. Taking vitamin and mineral supplements as recommended by your health care provider. What happens during an annual well check? The services and screenings done by your health care provider during your annual well check will depend on your age, overall health, lifestyle risk factors, and family history of disease. Counseling  Your health care provider may ask you questions about your: Alcohol use. Tobacco use. Drug use. Emotional well-being. Home and relationship well-being. Sexual activity. Eating habits. History of falls. Memory and ability to understand (cognition). Work and work Statistician. Reproductive health. Screening  You may have the following tests or measurements: Height, weight, and BMI. Blood pressure. Lipid and cholesterol levels. These may be checked every 5 years, or more frequently if you are over 46 years old. Skin check. Lung cancer screening. You may have this screening every year starting at age 68 if you have a 30-pack-year history of smoking and currently smoke or have quit within the past 15 years. Fecal occult blood test (FOBT) of the stool. You may have this test every year starting at age 6. Flexible sigmoidoscopy or colonoscopy. You may have a sigmoidoscopy every 5 years or a colonoscopy every 10 years starting at age 70. Hepatitis C blood test. Hepatitis B blood test. Sexually transmitted disease (STD) testing. Diabetes screening. This is done by checking  your blood sugar (glucose) after you have not eaten for a while (fasting). You may have this done every 1-3 years. Bone density scan. This is done to screen for osteoporosis. You may have this done starting at age 56. Mammogram. This may be done every 1-2 years. Talk to your health care provider about how often you should have regular mammograms. Talk with your health care provider about your test results, treatment options, and if necessary, the need for more  tests. Vaccines  Your health care provider may recommend certain vaccines, such as: Influenza vaccine. This is recommended every year. Tetanus, diphtheria, and acellular pertussis (Tdap, Td) vaccine. You may need a Td booster every 10 years. Zoster vaccine. You may need this after age 78. Pneumococcal 13-valent conjugate (PCV13) vaccine. One dose is recommended after age 107. Pneumococcal polysaccharide (PPSV23) vaccine. One dose is recommended after age 63. Talk to your health care provider about which screenings and vaccines you need and how often you need them. This information is not intended to replace advice given to you by your health care provider. Make sure you discuss any questions you have with your health care provider. Document Released: 11/03/2015 Document Revised: 06/26/2016 Document Reviewed: 08/08/2015 Elsevier Interactive Patient Education  2017 [place] Prevention in the Home Falls can cause injuries. They can happen to people of all ages. There are many things you can do to make your home safe and to help prevent falls. What can I do on the outside of my home? Regularly fix the edges of walkways and driveways and fix any cracks. Remove anything that might make you trip as you walk through a door, such as a raised step or threshold. Trim any bushes or trees on the path to your home. Use bright outdoor lighting. Clear any walking paths of anything that might make someone trip, such as rocks or tools. Regularly check to see if handrails are loose or broken. Make sure that both sides of any steps have handrails. Any raised decks and porches should have guardrails on the edges. Have any leaves, snow, or ice cleared regularly. Use sand or salt on walking paths during winter. Clean up any spills in your garage right away. This includes oil or grease spills. What can I do in the bathroom? Use night lights. Install grab bars by the toilet and in the tub and shower.  Do not use towel bars as grab bars. Use non-skid mats or decals in the tub or shower. If you need to sit down in the shower, use a plastic, non-slip stool. Keep the floor dry. Clean up any water that spills on the floor as soon as it happens. Remove soap buildup in the tub or shower regularly. Attach bath mats securely with double-sided non-slip rug tape. Do not have throw rugs and other things on the floor that can make you trip. What can I do in the bedroom? Use night lights. Make sure that you have a light by your bed that is easy to reach. Do not use any sheets or blankets that are too big for your bed. They should not hang down onto the floor. Have a firm chair that has side arms. You can use this for support while you get dressed. Do not have throw rugs and other things on the floor that can make you trip. What can I do in the kitchen? Clean up any spills right away. Avoid walking on wet floors. Keep items that you use a lot  in easy-to-reach places. If you need to reach something above you, use a strong step stool that has a grab bar. Keep electrical cords out of the way. Do not use floor polish or wax that makes floors slippery. If you must use wax, use non-skid floor wax. Do not have throw rugs and other things on the floor that can make you trip. What can I do with my stairs? Do not leave any items on the stairs. Make sure that there are handrails on both sides of the stairs and use them. Fix handrails that are broken or loose. Make sure that handrails are as long as the stairways. Check any carpeting to make sure that it is firmly attached to the stairs. Fix any carpet that is loose or worn. Avoid having throw rugs at the top or bottom of the stairs. If you do have throw rugs, attach them to the floor with carpet tape. Make sure that you have a light switch at the top of the stairs and the bottom of the stairs. If you do not have them, ask someone to add them for you. What else  can I do to help prevent falls? Wear shoes that: Do not have high heels. Have rubber bottoms. Are comfortable and fit you well. Are closed at the toe. Do not wear sandals. If you use a stepladder: Make sure that it is fully opened. Do not climb a closed stepladder. Make sure that both sides of the stepladder are locked into place. Ask someone to hold it for you, if possible. Clearly mark and make sure that you can see: Any grab bars or handrails. First and last steps. Where the edge of each step is. Use tools that help you move around (mobility aids) if they are needed. These include: Canes. Walkers. Scooters. Crutches. Turn on the lights when you go into a dark area. Replace any light bulbs as soon as they burn out. Set up your furniture so you have a clear path. Avoid moving your furniture around. If any of your floors are uneven, fix them. If there are any pets around you, be aware of where they are. Review your medicines with your doctor. Some medicines can make you feel dizzy. This can increase your chance of falling. Ask your doctor what other things that you can do to help prevent falls. This information is not intended to replace advice given to you by your health care provider. Make sure you discuss any questions you have with your health care provider. Document Released: 08/03/2009 Document Revised: 03/14/2016 Document Reviewed: 11/11/2014 Elsevier Interactive Patient Education  2017 [employer].

## 2023-01-14 NOTE — Progress Notes (Addendum)
I connected with  Kendra Schultz on 01/14/23 by a audio enabled telemedicine application and verified that I am speaking with the correct person using two identifiers.  Patient Location: Home  Provider Location: Office/Clinic  I discussed the limitations of evaluation and management by telemedicine. The patient expressed understanding and agreed to proceed.   Subjective:   Kendra Schultz is a 77 y.o. female who presents for Medicare Annual (Subsequent) preventive examination.  Review of Systems     Cardiac Risk Factors include: advanced age (>23men, >22 women);dyslipidemia;hypertension     Objective:    Today's Vitals   01/14/23 0929  Weight: 150 lb (68 kg)   Body mass index is 28.34 kg/m.     01/14/2023    9:34 AM 01/11/2022    9:28 AM 11/10/2020   11:54 AM 06/01/2019    6:02 AM 05/05/2017   12:00 PM 11/04/2016    2:04 PM 07/10/2016   10:22 AM  Advanced Directives  Does Patient Have a Medical Advance Directive? No No No No No No No  Would patient like information on creating a medical advance directive? No - Patient declined No - Patient declined No - Patient declined No - Patient declined   No - patient declined information    Current Medications (verified) Outpatient Encounter Medications as of 01/14/2023  Medication Sig   albuterol (VENTOLIN HFA) 108 (90 Base) MCG/ACT inhaler INHALE 2 PUFFS BY MOUTH INTO THE LUNGS EVERY 6 HOURS AS NEEDED FOR WHEEZING   atorvastatin (LIPITOR) 20 MG tablet TAKE 1 TABLET(20 MG) BY MOUTH DAILY   baclofen (LIORESAL) 10 MG tablet TAKE 1 TABLET(10 MG) BY MOUTH TWICE DAILY AS NEEDED FOR MUSCLE SPASMS   benazepril-hydrochlorthiazide (LOTENSIN HCT) 20-25 MG tablet TAKE 1 TABLET BY MOUTH DAILY   BREO ELLIPTA 100-25 MCG/ACT AEPB 1 puff daily.   Calcium-Phosphorus-Vitamin D (CITRACAL +D3 PO) Take 2 tablets by mouth daily.   Cholecalciferol (VITAMIN D3) 50 MCG (2000 UT) TABS Take 2,000 Units by mouth daily.   gabapentin (NEURONTIN) 100 MG capsule  Start 100 mg TID, may increase by 100 mg daily as tolerated to goal of 300 mg TID.   ibuprofen (ADVIL,MOTRIN) 200 MG tablet Take 400 mg by mouth every 6 (six) hours as needed. For pain   omeprazole (PRILOSEC) 20 MG capsule TAKE 1 CAPSULE(20 MG) BY MOUTH DAILY   predniSONE (DELTASONE) 20 MG tablet Take 2 tablets (40 mg total) by mouth daily.   Facility-Administered Encounter Medications as of 01/14/2023  Medication   0.9 %  sodium chloride infusion    Allergies (verified) Cephalexin   History: Past Medical History:  Diagnosis Date   Anemia    Arthritis    Cataract    surgery to remove   GERD (gastroesophageal reflux disease)    Hyperlipidemia    Hypertension    Osteoporosis    Past Surgical History:  Procedure Laterality Date   APPENDECTOMY     BREAST LUMPECTOMY  1997   right   CATARACT EXTRACTION, BILATERAL     ESOPHAGOGASTRODUODENOSCOPY     HYSTEROSCOPY WITH D & C N/A 06/01/2019   Procedure: DILATATION AND CURETTAGE /HYSTEROSCOPY;  Surgeon: Woodroe Mode, MD;  Location: [facility];  Service: Gynecology;  Laterality: N/A;   MASTECTOMY  1995   left   MASTECTOMY W/ SENTINEL NODE BIOPSY  09/17/2011   Procedure: MASTECTOMY WITH SENTINEL LYMPH NODE BIOPSY;  Surgeon: Joyice Faster. Cornett, MD;  Location: [facility];  Service: General;  Laterality: Right;  nuclear medicine injection 30  minutes prior for surgery    OVARIAN CYST REMOVAL     TONSILLECTOMY     Family History  Problem Relation Age of Onset   Heart disease Father    Cancer Father        lymphoma   Heart disease Brother    Breast cancer Sister    Breast cancer Sister    Colon cancer Neg Hx    Colon polyps Neg Hx    Esophageal cancer Neg Hx    Stomach cancer Neg Hx    Rectal cancer Neg Hx    Social History   Socioeconomic History   Marital status: Divorced    Spouse name: Not on file   Number of children: 2   Years of education: Not on file   Highest education level: Not on file  Occupational History   Occupation:  retired  Tobacco Use   Smoking status: Former    Packs/day: 1    Types: Cigarettes    Quit date: 11/22/2011    Years since quitting: 11.1   Smokeless tobacco: Never  Vaping Use   Vaping Use: Never used  Substance and Sexual Activity   Alcohol use: Not Currently    Comment: occasional wine   Drug use: No   Sexual activity: Not Currently    Birth control/protection: Post-menopausal  Other Topics Concern   Not on file  Social History Narrative   Used to work at [employer]   Two sons -- live with her   Divorced/widowed   Social Determinants of Health   Financial Resource Strain: Low Risk  (01/14/2023)   Overall Financial Resource Strain (CARDIA)    Difficulty of Paying Living Expenses: Not hard at all  Food Insecurity: No Food Insecurity (01/14/2023)   Hunger Vital Sign    Worried About Running Out of Food in the Last Year: Never true    [place]pot in the Last Year: Never true  Transportation Needs: No Transportation Needs (01/14/2023)   PRAPARE - Hydrologist (Medical): No    Lack of Transportation (Non-Medical): No  Physical Activity: Inactive (01/14/2023)   Exercise Vital Sign    Days of Exercise per Week: 0 days    Minutes of Exercise per Session: 0 min  Stress: No Stress Concern Present (01/14/2023)   [place]    Feeling of Stress : Not at all  Social Connections: Socially Isolated (01/14/2023)   Social Connection and Isolation Panel [NHANES]    Frequency of Communication with Friends and Family: More than three times a week    Frequency of Social Gatherings with Friends and Family: More than three times a week    Attends Religious Services: Never    Marine scientist or Organizations: No    Attends Music therapist: Never    Marital Status: Divorced    Tobacco Counseling Counseling given: Not Answered   Clinical Intake:  Pre-visit preparation  completed: Yes  Pain : No/denies pain     BMI - recorded: 28.34 Nutritional Status: BMI 25 -29 Overweight Nutritional Risks: None Diabetes: No  How often do you need to have someone help you when you read instructions, pamphlets, or other written materials from your doctor or pharmacy?: 1 - Never  Diabetic?no  Interpreter Needed?: No  Information entered by :: Charlott Rakes, LPN   Activities of Daily Living    01/14/2023    9:35 AM  In your present state of health, do you have any difficulty performing the following activities:  Hearing? 0  Vision? 0  Difficulty concentrating or making decisions? 0  Walking or climbing stairs? 0  Dressing or bathing? 0  Doing errands, shopping? 0  Preparing Food and eating ? N  Using the Toilet? N  In the past six months, have you accidently leaked urine? N  Do you have problems with loss of bowel control? N  Managing your Medications? N  Managing your Finances? N  Housekeeping or managing your Housekeeping? N    Patient Care Team: Inda Coke, [place] as PCP - General (Physician Assistant)  Indicate any recent Medical Services you may have received from other than [facility] providers in the past year (date may be approximate).     Assessment:   This is a routine wellness examination for [facility].  Hearing/Vision screen Hearing Screening - Comments:: Pt denies any hearing issues  Vision Screening - Comments:: [facility]   Dietary issues and exercise activities discussed: Current Exercise Habits: The patient does not participate in regular exercise at present   Goals Addressed             This Visit's Progress    Patient Stated       None at this time        Depression Screen    01/14/2023    9:32 AM 01/11/2022    9:27 AM 06/15/2021    1:47 PM 11/10/2020   11:52 AM 02/25/2020   10:29 AM  PHQ 2/9 Scores  PHQ - 2 Score 0 0 0 0 0    Fall Risk    01/14/2023    9:34 AM 01/11/2022     9:28 AM 11/10/2020   11:55 AM 02/25/2020   10:29 AM  Fall Risk   Falls in the past year? 1 0 0 0  Number falls in past yr: 1 0 0 0  Injury with Fall? 1 0 0   Comment knees     Risk for fall due to : Impaired vision Impaired vision    Follow up Falls prevention discussed Falls prevention discussed Falls prevention discussed Falls evaluation completed;Education provided    FALL RISK PREVENTION PERTAINING TO THE HOME:  Any stairs in or around the home? Yes  If so, are there any without handrails? No  Home free of loose throw rugs in walkways, pet beds, electrical cords, etc? Yes  Adequate lighting in your home to reduce risk of falls? Yes   ASSISTIVE DEVICES UTILIZED TO PREVENT FALLS:  Life alert? No  Use of a cane, walker or w/c? No  Grab bars in the bathroom? Yes  Shower chair or bench in shower? No  Elevated toilet seat or a handicapped toilet? No   TIMED UP AND GO:  Was the test performed? No .   Cognitive Function:pt declined         11/10/2020   11:56 AM  6CIT Screen  What Year? 0 points  What month? 0 points  Count back from 20 0 points  Months in reverse 4 points  Repeat phrase 0 points    Immunizations Immunization History  Administered Date(s) Administered   Pneumococcal Conjugate-13 09/01/2020    TDAP status: Due, Education has been provided regarding the importance of this vaccine. Advised may receive this vaccine at local pharmacy or Health Dept. Aware to provide a copy of the vaccination record if obtained from local  pharmacy or Health Dept. Verbalized acceptance and understanding.  Flu Vaccine status: Declined, Education has been provided regarding the importance of this vaccine but patient still declined. Advised may receive this vaccine at local pharmacy or Health Dept. Aware to provide a copy of the vaccination record if obtained from local pharmacy or Health Dept. Verbalized acceptance and understanding.  Pneumococcal vaccine status: Due, Education  has been provided regarding the importance of this vaccine. Advised may receive this vaccine at local pharmacy or Health Dept. Aware to provide a copy of the vaccination record if obtained from local pharmacy or Health Dept. Verbalized acceptance and understanding.  Covid-19 vaccine status: Declined, Education has been provided regarding the importance of this vaccine but patient still declined. Advised may receive this vaccine at local pharmacy or Health Dept.or vaccine clinic. Aware to provide a copy of the vaccination record if obtained from local pharmacy or Health Dept. Verbalized acceptance and understanding.  Qualifies for Shingles Vaccine? Yes   Zostavax completed No   Shingrix Completed?: No.    Education has been provided regarding the importance of this vaccine. Patient has been advised to call insurance company to determine out of pocket expense if they have not yet received this vaccine. Advised may also receive vaccine at local pharmacy or Health Dept. Verbalized acceptance and understanding.  Screening Tests Health Maintenance  Topic Date Due   DTaP/Tdap/Td (1 - Tdap) Never done   Zoster Vaccines- Shingrix (1 of 2) Never done   Pneumonia Vaccine 41+ Years old (2 of 2 - PPSV23 or PCV20) 09/01/2021   Medicare Annual Wellness (AWV)  01/14/2024   DEXA SCAN  Completed   Hepatitis C Screening  Completed   HPV VACCINES  Aged Out   INFLUENZA VACCINE  Discontinued   COLONOSCOPY (Pts 45-83yrs Insurance coverage will need to be confirmed)  Discontinued   COVID-19 Vaccine  Discontinued    Health Maintenance  Health Maintenance Due  Topic Date Due   DTaP/Tdap/Td (1 - Tdap) Never done   Zoster Vaccines- Shingrix (1 of 2) Never done   Pneumonia Vaccine 86+ Years old (2 of 2 - PPSV23 or PCV20) 09/01/2021    Colorectal cancer screening: No longer required.   Mammogram status: Completed 08/27/11. Repeat every year  Bone Density status: Completed 09/12/20. Results reflect: Bone density  results: OSTEOPENIA. Repeat every 2 years.  Additional Screening:  Hepatitis C Screening:  Completed 01/24/17  Vision Screening: Recommended annual ophthalmology exams for early detection of glaucoma and other disorders of the eye. Is the patient up to date with their annual eye exam?  Yes  Who is the provider or what is the name of the office in which the patient attends [facility] If pt is not established with a provider, would they like to be referred to a provider to establish care? No .   Dental Screening: Recommended annual dental exams for proper oral hygiene  Community Resource Referral / Chronic Care Management: CRR required this visit?  No   CCM required this visit?  No      Plan:     I have personally reviewed and noted the following in the patient's chart:   Medical and social history Use of alcohol, tobacco or illicit drugs  Current medications and supplements including opioid prescriptions. Patient is not currently taking opioid prescriptions. Functional ability and status Nutritional status Physical activity Advanced directives List of other physicians Hospitalizations, surgeries, and ER visits in previous 12 months Vitals Screenings to include  cognitive, depression, and falls Referrals and appointments  In addition, I have reviewed and discussed with patient certain preventive protocols, quality metrics, and best practice recommendations. A written personalized care plan for preventive services as well as general preventive health recommendations were provided to patient.     Willette Brace, LPN   [phone]  Nurse Notes: Pt declined to do cognition testing at this time pt was able and knowledgeable to questions asked

## 2023-03-07 ENCOUNTER — Other Ambulatory Visit: Payer: Self-pay | Admitting: Physician Assistant

## 2023-04-11 ENCOUNTER — Other Ambulatory Visit: Payer: Self-pay | Admitting: Physician Assistant

## 2023-06-13 ENCOUNTER — Other Ambulatory Visit: Payer: Self-pay | Admitting: Physician Assistant

## 2023-07-30 ENCOUNTER — Other Ambulatory Visit: Payer: Self-pay | Admitting: Physician Assistant

## 2023-09-02 ENCOUNTER — Other Ambulatory Visit: Payer: Self-pay | Admitting: Physician Assistant

## 2023-09-17 ENCOUNTER — Other Ambulatory Visit: Payer: Self-pay | Admitting: Physician Assistant

## 2023-10-08 ENCOUNTER — Other Ambulatory Visit: Payer: Self-pay | Admitting: Physician Assistant

## 2023-11-06 ENCOUNTER — Other Ambulatory Visit: Payer: Self-pay | Admitting: Physician Assistant

## 2023-11-10 ENCOUNTER — Encounter: Payer: Self-pay | Admitting: Internal Medicine

## 2023-11-12 ENCOUNTER — Ambulatory Visit (INDEPENDENT_AMBULATORY_CARE_PROVIDER_SITE_OTHER): Payer: 59 | Admitting: Physician Assistant

## 2023-11-12 ENCOUNTER — Encounter: Payer: Self-pay | Admitting: Physician Assistant

## 2023-11-12 VITALS — BP 130/80 | HR 71 | Temp 98.1°F | Ht 61.0 in | Wt 151.0 lb

## 2023-11-12 DIAGNOSIS — M1990 Unspecified osteoarthritis, unspecified site: Secondary | ICD-10-CM | POA: Diagnosis not present

## 2023-11-12 DIAGNOSIS — B0229 Other postherpetic nervous system involvement: Secondary | ICD-10-CM | POA: Diagnosis not present

## 2023-11-12 DIAGNOSIS — R040 Epistaxis: Secondary | ICD-10-CM

## 2023-11-12 DIAGNOSIS — E2839 Other primary ovarian failure: Secondary | ICD-10-CM

## 2023-11-12 DIAGNOSIS — Z0000 Encounter for general adult medical examination without abnormal findings: Secondary | ICD-10-CM | POA: Diagnosis not present

## 2023-11-12 DIAGNOSIS — E663 Overweight: Secondary | ICD-10-CM | POA: Diagnosis not present

## 2023-11-12 DIAGNOSIS — R053 Chronic cough: Secondary | ICD-10-CM | POA: Diagnosis not present

## 2023-11-12 DIAGNOSIS — M8580 Other specified disorders of bone density and structure, unspecified site: Secondary | ICD-10-CM | POA: Diagnosis not present

## 2023-11-12 DIAGNOSIS — E785 Hyperlipidemia, unspecified: Secondary | ICD-10-CM | POA: Diagnosis not present

## 2023-11-12 DIAGNOSIS — I10 Essential (primary) hypertension: Secondary | ICD-10-CM

## 2023-11-12 LAB — LIPID PANEL
Cholesterol: 145 mg/dL (ref 0–200)
HDL: 42.8 mg/dL (ref >39.00)
LDL Cholesterol: 72 mg/dL (ref 0–99)
NonHDL: 101.92
Total CHOL/HDL Ratio: 3
Triglycerides: 150 mg/dL — ABNORMAL HIGH (ref 0.0–149.0)
VLDL: 30 mg/dL (ref 0.0–40.0)

## 2023-11-12 LAB — CBC WITH DIFFERENTIAL/PLATELET
Basophils Absolute: 0 10*3/uL (ref 0.0–0.1)
Basophils Relative: 0.6 % (ref 0.0–3.0)
Eosinophils Absolute: 0.1 10*3/uL (ref 0.0–0.7)
Eosinophils Relative: 2 % (ref 0.0–5.0)
HCT: 41.2 % (ref 36.0–46.0)
Hemoglobin: 13.6 g/dL (ref 12.0–15.0)
Lymphocytes Relative: 26.6 % (ref 12.0–46.0)
Lymphs Abs: 1.6 10*3/uL (ref 0.7–4.0)
MCHC: 33 g/dL (ref 30.0–36.0)
MCV: 85.1 fL (ref 78.0–100.0)
Monocytes Absolute: 0.5 10*3/uL (ref 0.1–1.0)
Monocytes Relative: 8.3 % (ref 3.0–12.0)
Neutro Abs: 3.9 10*3/uL (ref 1.4–7.7)
Neutrophils Relative %: 62.5 % (ref 43.0–77.0)
Platelets: 269 10*3/uL (ref 150.0–400.0)
RBC: 4.84 Mil/uL (ref 3.87–5.11)
RDW: 14 % (ref 11.5–15.5)
WBC: 6.2 10*3/uL (ref 4.0–10.5)

## 2023-11-12 LAB — COMPREHENSIVE METABOLIC PANEL
ALT: 14 U/L (ref 0–35)
AST: 16 U/L (ref 0–37)
Albumin: 4.3 g/dL (ref 3.5–5.2)
Alkaline Phosphatase: 67 U/L (ref 39–117)
BUN: 23 mg/dL (ref 6–23)
CO2: 27 meq/L (ref 19–32)
Calcium: 9.7 mg/dL (ref 8.4–10.5)
Chloride: 106 meq/L (ref 96–112)
Creatinine, Ser: 0.79 mg/dL (ref 0.40–1.20)
GFR: 72.19 mL/min (ref >60.00)
Glucose, Bld: 86 mg/dL (ref 70–99)
Potassium: 3.9 meq/L (ref 3.5–5.1)
Sodium: 141 meq/L (ref 135–145)
Total Bilirubin: 0.5 mg/dL (ref 0.2–1.2)
Total Protein: 7 g/dL (ref 6.0–8.3)

## 2023-11-12 MED ORDER — BREO ELLIPTA 100-25 MCG/ACT IN AEPB: 1.0000 | INHALATION_SPRAY | Freq: Every day | RESPIRATORY_TRACT | 2 refills | Status: AC

## 2023-11-12 MED ORDER — BACLOFEN 10 MG PO TABS: 10.0000 mg | ORAL_TABLET | Freq: Two times a day (BID) | ORAL | 2 refills | Status: AC | PRN

## 2023-11-12 MED ORDER — BENAZEPRIL-HYDROCHLOROTHIAZIDE 20-25 MG PO TABS: 1.0000 | ORAL_TABLET | Freq: Every day | ORAL | 3 refills | Status: DC

## 2023-11-12 MED ORDER — ALBUTEROL SULFATE HFA 108 (90 BASE) MCG/ACT IN AERS: INHALATION_SPRAY | RESPIRATORY_TRACT | 5 refills | Status: AC

## 2023-11-12 MED ORDER — GABAPENTIN 100 MG PO CAPS: 100.0000 mg | ORAL_CAPSULE | Freq: Every day | ORAL | 1 refills | Status: AC | PRN

## 2023-11-12 MED ORDER — OMEPRAZOLE 20 MG PO CPDR: 20.0000 mg | DELAYED_RELEASE_CAPSULE | Freq: Every day | ORAL | 3 refills | Status: AC

## 2023-11-12 MED ORDER — ATORVASTATIN CALCIUM 20 MG PO TABS: 20.0000 mg | ORAL_TABLET | Freq: Every day | ORAL | 3 refills | Status: AC

## 2023-11-12 NOTE — Progress Notes (Signed)
Subjective:    Kendra Schultz is a 78 y.o. female and is here for a comprehensive physical exam.  HPI  There are no preventive care reminders to display for this patient.  Acute Concerns: Nose Bleeds  She complains of experiencing several episodes of nose bleeds.  She typically has around 3 episodes a week. Certain days, she can have multiple episodes a day. Episodes typically last for about 5 minutes.  She denies having a dry nose or taking any allergy medications or nasal spray Will place referral to ENT.   Chronic Issues: HTN  Reports compliance and good tolerance of Lotensin HCT 20-25 mg once daily.   Doesn't typically checks her BP at home. No complains or concerns at this time.  Patient denies chest pain, SOB, blurred vision, dizziness, unusual headaches, lower leg swelling. Patient is compliant with medication. Denies excessive caffeine intake, stimulant usage, excessive alcohol intake, or increase in salt  GERD  Continues with Omeprazole 20 mg once daily. Tolerates this well. She states that Pepcid tends to relief her symptoms more than omeprazole does.   No complains or concerns at this time.   HLD  Currently managed by Lipitor 20 mg once daily. Tolerates this well. No complains or concerns at this time.   Hx of Shingles  She continues to experience some discomfort in her ears.   She typically takes gabapentin 100 mg as needed which is 3-4x a week. No other complains at this time.  Back Pain  She reports taking Baclofen 10 mg 3-4x a week to relief her pain. Tolerates this well and tends to relief her pain.   Health Maintenance: Immunizations -- N/A Colonoscopy -- last done on 02-28-21 Mammogram -- last done on 08-27-11 PAP -- N/A Bone Density -- last done on 09-12-20. Normal. Diet -- typical balanced diet Exercise -- no exercise   Sleep habits -- no complains Mood -- stable   UTD with dentist? - yes UTD with eye doctor? - yes  Weight history: Wt  Readings from Last 10 Encounters:  11/12/23 151 lb (68.5 kg)  01/14/23 150 lb (68 kg)  02/12/22 150 lb 6.1 oz (68.2 kg)  11/23/21 152 lb 8 oz (69.2 kg)  11/16/21 147 lb (66.7 kg)  06/15/21 155 lb 9.6 oz (70.6 kg)  03/02/21 153 lb 6.4 oz (69.6 kg)  02/28/21 155 lb (70.3 kg)  02/14/21 155 lb (70.3 kg)  02/09/21 155 lb (70.3 kg)   Body mass index is 28.53 kg/m. No LMP recorded (lmp unknown). Patient is postmenopausal.  Alcohol use:  reports that she does not currently use alcohol.  Tobacco use:  Tobacco Use: Medium Risk (11/12/2023)   Patient History    Smoking Tobacco Use: Former    Smokeless Tobacco Use: Never    Passive Exposure: Not on file   Eligible for lung cancer screening? NO     11/12/2023   11:19 AM  Depression screen PHQ 2/9  Decreased Interest 0  Down, Depressed, Hopeless 0  PHQ - 2 Score 0     Other providers/specialists: Patient Care Team: Jarold Motto, [place] as PCP - General (Physician Assistant)    PMHx, SurgHx, SocialHx, Medications, and Allergies were reviewed in the Visit Navigator and updated as appropriate.   Past Medical History:  Diagnosis Date   Anemia    Arthritis    Cataract    surgery to remove   GERD (gastroesophageal reflux disease)    Hyperlipidemia    Hypertension    Osteoporosis  Past Surgical History:  Procedure Laterality Date   APPENDECTOMY     BREAST LUMPECTOMY  1997   right   CATARACT EXTRACTION, BILATERAL     ESOPHAGOGASTRODUODENOSCOPY     HYSTEROSCOPY WITH D & C N/A 06/01/2019   Procedure: DILATATION AND CURETTAGE /HYSTEROSCOPY;  Surgeon: Adam Phenix, MD;  [facility] OR;  Service: Gynecology;  Laterality: N/A;   MASTECTOMY  1995   left   MASTECTOMY W/ SENTINEL NODE BIOPSY  09/17/2011   Procedure: MASTECTOMY WITH SENTINEL LYMPH NODE BIOPSY;  Surgeon: Clovis Pu. Cornett, MD;  Location: [facility] OR;  Service: General;  Laterality: Right;  nuclear medicine injection 30 minutes prior for surgery    OVARIAN CYST  REMOVAL     TONSILLECTOMY       Family History  Problem Relation Age of Onset   Heart disease Father    Cancer Father        lymphoma   Heart disease Brother    Breast cancer Sister    Breast cancer Sister    Colon cancer Neg Hx    Colon polyps Neg Hx    Esophageal cancer Neg Hx    Stomach cancer Neg Hx    Rectal cancer Neg Hx     Social History   Tobacco Use   Smoking status: Former    Current packs/day: 0.00    Types: Cigarettes    Quit date: 11/22/2011    Years since quitting: 11.9   Smokeless tobacco: Never  Vaping Use   Vaping status: Never Used  Substance Use Topics   Alcohol use: Not Currently    Comment: occasional wine   Drug use: No    Review of Systems:   Review of Systems  Constitutional:  Negative for chills, fever, malaise/fatigue and weight loss.  HENT:  Negative for hearing loss, sinus pain and sore throat.   Respiratory:  Negative for cough and hemoptysis.   Cardiovascular:  Negative for chest pain, palpitations, leg swelling and PND.  Gastrointestinal:  Negative for abdominal pain, constipation, diarrhea, heartburn, nausea and vomiting.  Genitourinary:  Negative for dysuria, frequency and urgency.  Musculoskeletal:  Negative for back pain, myalgias and neck pain.  Skin:  Negative for itching and rash.  Neurological:  Negative for dizziness, tingling, seizures and headaches.  Endo/Heme/Allergies:  Negative for polydipsia.  Psychiatric/Behavioral:  Negative for depression. The patient is not nervous/anxious.     Objective:   BP 130/80 (BP Location: Left Arm, Patient Position: Sitting, Cuff Size: Large)   Pulse 71   Temp 98.1 F (36.7 C) (Temporal)   Ht 5\' 1"  (1.549 m)   Wt 151 lb (68.5 kg)   LMP  (LMP Unknown) Comment: LMP 1998  SpO2 98%   BMI 28.53 kg/m  Body mass index is 28.53 kg/m.   General Appearance:    Alert, cooperative, no distress, appears stated age  Head:    Normocephalic, without obvious abnormality, atraumatic  Eyes:     PERRL, conjunctiva/corneas clear, EOM's intact, fundi    benign, both eyes  Ears:    Normal TM's and external ear canals, both ears  Nose:   Nares normal, septum midline, mucosa normal, no drainage    or sinus tenderness  Throat:   Lips, mucosa, and tongue normal; teeth and gums normal  Neck:   Supple, symmetrical, trachea midline, no adenopathy;    thyroid:  no enlargement/tenderness/nodules; no carotid   bruit or JVD  Back:     Symmetric, no curvature, ROM normal,  no CVA tenderness  Lungs:     Clear to auscultation bilaterally, respirations unlabored  Chest Wall:    No tenderness or deformity   Heart:    Regular rate and rhythm, S1 and S2 normal, no murmur, rub or gallop  Breast Exam:    Deferred   Abdomen:     Soft, non-tender, bowel sounds active all four quadrants,    no masses, no organomegaly  Genitalia:    Deferred   Extremities:   Extremities normal, atraumatic, no cyanosis or edema  Pulses:   2+ and symmetric all extremities  Skin:   Skin color, texture, turgor normal, no rashes or lesions  Lymph nodes:   Cervical, supraclavicular, and axillary nodes normal  Neurologic:   CNII-XII intact, normal strength, sensation and reflexes    throughout    Assessment/Plan:   Routine physical examination Today patient counseled on age appropriate routine health concerns for screening and prevention, each reviewed and up to date or declined. Immunizations reviewed and up to date or declined. Labs ordered and reviewed. Risk factors for depression reviewed and negative. Hearing function and visual acuity are intact. ADLs screened and addressed as needed. Functional ability and level of safety reviewed and appropriate. Education, counseling and referrals performed based on assessed risks today. Patient provided with a copy of personalized plan for preventive services.  Epistaxis No red flags Recommend nasal saline gel or spray If persists, will refer to ent  Other postherpetic nervous  system involvement Well controlled Continue as needed 100 mg gabapentin for pain Follow-up yearly, sooner if concerns  Benign essential hypertension Normotensive Continue Lotensin HCT 20-25 mg once daily.   Follow-up  yearly, sooner if concerns  Overweight Continue efforts at healthy lifestyle  Estrogen deficiency; Osteopenia, unspecified location Update DEXA -- order placed  Dyslipidemia Update lipid panel and adjust atorvastatin 20 mg daily accordingly  Arthritis Overall controlled Continue baclofen 10 mg twice daily as needed as needed  Chronic cough Continue breo ellipta 1 puff daily   Jarold Motto, PA-C [place] [facility] [place]   I,Safa M Kadhim,acting as a Neurosurgeon for [employer], PA.,have documented all relevant documentation on the behalf of Jarold Motto, PA,as directed by  Jarold Motto, PA while in the presence of Jarold Motto, [place].   I, Jarold Motto, [place], have reviewed all documentation for this visit. The documentation on 11/12/23 for the exam, diagnosis, procedures, and orders are all accurate and complete.

## 2023-11-12 NOTE — Patient Instructions (Signed)
It was great to see you!  Please schedule your bone density test with the front desk when you leave today  Try either Saline Nasal Spray or Saline Gel for your frequent nosebleeds -- if this does not help - please let me know and we will refer to ENT        Take care,  Jarold Motto PA-C

## 2023-11-20 ENCOUNTER — Other Ambulatory Visit: Payer: 59

## 2023-11-20 ENCOUNTER — Ambulatory Visit (INDEPENDENT_AMBULATORY_CARE_PROVIDER_SITE_OTHER)
Admission: RE | Admit: 2023-11-20 | Discharge: 2023-11-20 | Disposition: A | Discharge status: Home / Self Care | Payer: 59 | Source: Ambulatory Visit | Attending: Physician Assistant | Admitting: Physician Assistant

## 2023-11-20 DIAGNOSIS — E2839 Other primary ovarian failure: Secondary | ICD-10-CM | POA: Diagnosis not present

## 2023-11-20 DIAGNOSIS — M8580 Other specified disorders of bone density and structure, unspecified site: Secondary | ICD-10-CM | POA: Diagnosis not present

## 2023-11-24 ENCOUNTER — Other Ambulatory Visit (HOSPITAL_COMMUNITY): Payer: Self-pay

## 2023-11-24 ENCOUNTER — Telehealth: Payer: Self-pay

## 2023-11-24 ENCOUNTER — Telehealth: Payer: Self-pay | Admitting: *Deleted

## 2023-11-24 ENCOUNTER — Other Ambulatory Visit: Payer: Self-pay | Admitting: *Deleted

## 2023-11-24 DIAGNOSIS — M810 Age-related osteoporosis without current pathological fracture: Secondary | ICD-10-CM

## 2023-11-24 MED ORDER — DENOSUMAB 60 MG/ML ~~LOC~~ SOSY: 60.0000 mg | PREFILLED_SYRINGE | Freq: Once | SUBCUTANEOUS | Status: DC

## 2023-11-24 NOTE — Telephone Encounter (Signed)
Copied from CRM [id number]. Topic: Clinical - Medication Question >> Nov 24, 2023 12:24 PM Adaysia C wrote: Reason for CRM: Patient returned call from Nurse Lupita Leash regarding her PA for Prolia medication. Please follow up with patient [phone]

## 2023-11-24 NOTE — Telephone Encounter (Signed)
Spoke to pt told her I did not call her again, it must of been on voicemail that I was calling but then you picked up. Told her will be in touch once we hear back from insurance about Prolia being approved or not. Pt verbalized understanding

## 2023-11-24 NOTE — Telephone Encounter (Signed)
 Prolia VOB initiated via [email or web address]

## 2023-11-24 NOTE — Progress Notes (Unsigned)
Please work on [employer] for [employer], per [employer].

## 2023-11-25 NOTE — Progress Notes (Signed)
 Noted

## 2023-11-27 ENCOUNTER — Other Ambulatory Visit (HOSPITAL_COMMUNITY): Payer: Self-pay

## 2023-11-27 NOTE — Telephone Encounter (Signed)
 Kendra Schultz

## 2023-11-27 NOTE — Telephone Encounter (Signed)
 Prior Authorization form/request asks a question that requires your assistance. Please see the question below and advise accordingly. The PA will not be submitted until the necessary information is received.                       Ran test claim for PROLIA . Currently a quantity of 1 ML is a 180 day supply and the co-pay is $0.   This test claim was processed through [facility]- copay amounts may vary at other pharmacies due to pharmacy/plan contracts, or as the patient moves through the different stages of their insurance plan.

## 2023-12-01 ENCOUNTER — Other Ambulatory Visit: Payer: Self-pay | Admitting: *Deleted

## 2023-12-01 DIAGNOSIS — M810 Age-related osteoporosis without current pathological fracture: Secondary | ICD-10-CM

## 2023-12-01 NOTE — Telephone Encounter (Signed)
 Spoke to pt told her per Kendra Schultz, Please let patient know that her Prolia  was not approved because her insurance requires her to have failed another medication, such as an IV medication, to help with her bone density.  At this point, I refer to endocrinology so they can manage this as I am not comfortable managing these type of treatments from our office.  Pt verbalized understanding and agreed to referral to Endo. Told her I will place referral and someone will be in touch to schedule an appt. Pt verbalized understanding.

## 2024-01-28 ENCOUNTER — Ambulatory Visit (INDEPENDENT_AMBULATORY_CARE_PROVIDER_SITE_OTHER): Payer: 59

## 2024-01-28 VITALS — BP 130/80 | Ht 61.0 in | Wt 148.0 lb

## 2024-01-28 DIAGNOSIS — Z0000 Encounter for general adult medical examination without abnormal findings: Secondary | ICD-10-CM | POA: Diagnosis not present

## 2024-01-28 NOTE — Patient Instructions (Signed)
 Kendra Schultz , Thank you for taking time to come for your Medicare Wellness Visit. I appreciate your ongoing commitment to your health goals. Please review the following plan we discussed and let me know if I can assist you in the future.   Referrals/Orders/Follow-Ups/Clinician Recommendations: follow up in 1 year.  This is a list of the screening recommended for you and due dates:  Health Maintenance  Topic Date Due   Zoster (Shingles) Vaccine (2 of 2) 02/10/2024*   DTaP/Tdap/Td vaccine (1 - Tdap) 11/11/2024*   Flu Shot  05/21/2024   Medicare Annual Wellness Visit  01/27/2025   DEXA scan (bone density measurement)  11/19/2026   Pneumonia Vaccine  Completed   Hepatitis C Screening  Completed   HPV Vaccine  Aged Out   Colon Cancer Screening  Discontinued   COVID-19 Vaccine  Discontinued  *Topic was postponed. The date shown is not the original due date.    Advanced directives: (Declined) Advance directive discussed with you today. Even though you declined this today, please call our office should you change your mind, and we can give you the proper paperwork for you to fill out.  Next Medicare Annual Wellness Visit scheduled for next year: Yes

## 2024-01-28 NOTE — Progress Notes (Signed)
 Because this visit was a virtual/telehealth visit,  certain criteria was not obtained, such a blood pressure, CBG if applicable, and timed get up and go. Any medications not marked as "taking" were not mentioned during the medication reconciliation part of the visit. Any vitals not documented were not able to be obtained due to this being a telehealth visit or patient was unable to self-report a recent blood pressure reading due to a lack of equipment at home via telehealth. Vitals that have been documented are verbally provided by the patient.   Subjective:   Kendra Schultz is a 78 y.o. who presents for a Medicare Wellness preventive visit.  Visit Complete: Virtual I connected with  Kendra Schultz on 01/28/24 by a audio enabled telemedicine application and verified that I am speaking with the correct person using two identifiers.  Patient Location: Home  Provider Location: Home Office  I discussed the limitations of evaluation and management by telemedicine. The patient expressed understanding and agreed to proceed.  Vital Signs: Because this visit was a virtual/telehealth visit, some criteria may be missing or patient reported. Any vitals not documented were not able to be obtained and vitals that have been documented are patient reported.  VideoDeclined- This patient declined Librarian, academic. Therefore the visit was completed with audio only.  Persons Participating in Visit: Patient.  AWV Questionnaire: No: Patient Medicare AWV questionnaire was not completed prior to this visit.  Cardiac Risk Factors include: advanced age (>7men, >31 women);hypertension;dyslipidemia     Objective:    Today's Vitals   01/28/24 1319  BP: 130/80  Weight: 148 lb (67.1 kg)  Height: 5\' 1"  (1.549 m)   Body mass index is 27.96 kg/m.     01/28/2024    1:19 PM 01/14/2023    9:34 AM 01/11/2022    9:28 AM 11/10/2020   11:54 AM 06/01/2019    6:02 AM 05/05/2017   12:00 PM  11/04/2016    2:04 PM  Advanced Directives  Does Patient Have a Medical Advance Directive? No No No No No No No  Would patient like information on creating a medical advance directive? No - Patient declined No - Patient declined No - Patient declined No - Patient declined No - Patient declined      Current Medications (verified) Outpatient Encounter Medications as of 01/28/2024  Medication Sig   albuterol (VENTOLIN HFA) 108 (90 Base) MCG/ACT inhaler INHALE 2 PUFFS BY MOUTH INTO THE LUNGS EVERY 6 HOURS AS NEEDED FOR WHEEZING   atorvastatin (LIPITOR) 20 MG tablet Take 1 tablet (20 mg total) by mouth daily.   baclofen (LIORESAL) 10 MG tablet Take 1 tablet (10 mg total) by mouth 2 (two) times daily as needed for muscle spasms.   benazepril-hydrochlorthiazide (LOTENSIN HCT) 20-25 MG tablet Take 1 tablet by mouth daily.   BREO ELLIPTA 100-25 MCG/ACT AEPB Inhale 1 puff into the lungs daily.   Calcium-Phosphorus-Vitamin D (CITRACAL +D3 PO) Take 2 tablets by mouth daily.   Cholecalciferol (VITAMIN D3) 50 MCG (2000 UT) TABS Take 2,000 Units by mouth daily.   gabapentin (NEURONTIN) 100 MG capsule Take 1 capsule (100 mg total) by mouth daily as needed. Take 1 capsule daily as needed for pain.   ibuprofen (ADVIL,MOTRIN) 200 MG tablet Take 400 mg by mouth every 6 (six) hours as needed. For pain   omeprazole (PRILOSEC) 20 MG capsule Take 1 capsule (20 mg total) by mouth daily.   Facility-Administered Encounter Medications as of 01/28/2024  Medication  0.9 %  sodium chloride infusion   denosumab (PROLIA) injection 60 mg    Allergies (verified) Cephalexin   History: Past Medical History:  Diagnosis Date   Anemia    Arthritis    Cataract    surgery to remove   GERD (gastroesophageal reflux disease)    Hyperlipidemia    Hypertension    Osteoporosis    Past Surgical History:  Procedure Laterality Date   APPENDECTOMY     BREAST LUMPECTOMY  1997   right   CATARACT EXTRACTION, BILATERAL      ESOPHAGOGASTRODUODENOSCOPY     HYSTEROSCOPY WITH D & C N/A 06/01/2019   Procedure: DILATATION AND CURETTAGE /HYSTEROSCOPY;  Surgeon: Adam Phenix, MD;  Location: [facility] OR;  Service: Gynecology;  Laterality: N/A;   MASTECTOMY  1995   left   MASTECTOMY W/ SENTINEL NODE BIOPSY  09/17/2011   Procedure: MASTECTOMY WITH SENTINEL LYMPH NODE BIOPSY;  Surgeon: Clovis Pu. Cornett, MD;  Location: [facility] OR;  Service: General;  Laterality: Right;  nuclear medicine injection 30 minutes prior for surgery    OVARIAN CYST REMOVAL     TONSILLECTOMY     Family History  Problem Relation Age of Onset   Heart disease Father    Cancer Father        lymphoma   Heart disease Brother    Breast cancer Sister    Breast cancer Sister    Colon cancer Neg Hx    Colon polyps Neg Hx    Esophageal cancer Neg Hx    Stomach cancer Neg Hx    Rectal cancer Neg Hx    Social History   Socioeconomic History   Marital status: Divorced    Spouse name: Not on file   Number of children: 2   Years of education: Not on file   Highest education level: Not on file  Occupational History   Occupation: retired  Tobacco Use   Smoking status: Former    Current packs/day: 0.00    Types: Cigarettes    Quit date: 11/22/2011    Years since quitting: 12.1   Smokeless tobacco: Never  Vaping Use   Vaping status: Never Used  Substance and Sexual Activity   Alcohol use: Not Currently    Comment: occasional wine   Drug use: No   Sexual activity: Not Currently    Birth control/protection: Post-menopausal  Other Topics Concern   Not on file  Social History Narrative   Used to work at [employer]   Two sons -- live with her   Divorced/widowed   Social Drivers of Corporate investment banker Strain: Low Risk  (01/28/2024)   Overall Financial Resource Strain (CARDIA)    Difficulty of Paying Living Expenses: Not hard at all  Food Insecurity: No Food Insecurity (01/28/2024)   Hunger Vital Sign    Worried About Running Out of Food in  the Last Year: Never true    Ran Out of Food in the Last Year: Never true  Transportation Needs: No Transportation Needs (01/28/2024)   PRAPARE - Administrator, Civil Service (Medical): No    Lack of Transportation (Non-Medical): No  Physical Activity: Patient Declined (01/28/2024)   Exercise Vital Sign    Days of Exercise per Week: Patient declined    Minutes of Exercise per Session: Patient declined  Stress: No Stress Concern Present (01/28/2024)   [employer] of Occupational Health - Occupational Stress Questionnaire    Feeling of Stress : Not at  all  Social Connections: Socially Isolated (01/28/2024)   Social Connection and Isolation Panel [NHANES]    Frequency of Communication with Friends and Family: More than three times a week    Frequency of Social Gatherings with Friends and Family: More than three times a week    Attends Religious Services: Never    Database administrator or Organizations: No    Attends Engineer, structural: Never    Marital Status: Divorced    Tobacco Counseling Counseling given: Not Answered    Clinical Intake:  Pre-visit preparation completed: Yes  Pain : No/denies pain     BMI - recorded: 27.96 Nutritional Status: BMI 25 -29 Overweight Nutritional Risks: None Diabetes: No  No results found for: "HGBA1C"   How often do you need to have someone help you when you read instructions, pamphlets, or other written materials from your doctor or pharmacy?: 1 - Never  Interpreter Needed?: No  Information entered by :: Estell Harpin   Activities of Daily Living     01/28/2024    1:23 PM  In your present state of health, do you have any difficulty performing the following activities:  Hearing? 0  Vision? 0  Difficulty concentrating or making decisions? 0  Walking or climbing stairs? 0  Dressing or bathing? 0  Doing errands, shopping? 0  Preparing Food and eating ? N  Using the Toilet? N  In the past six months,  have you accidently leaked urine? Y  Do you have problems with loss of bowel control? N  Managing your Medications? N  Managing your Finances? N  Housekeeping or managing your Housekeeping? N    Patient Care Team: Jarold Motto, [place] as PCP - General (Physician Assistant)  Indicate any recent Medical Services you may have received from other than [facility] providers in the past year (date may be approximate).     Assessment:   This is a routine wellness examination for [facility].  Hearing/Vision screen Hearing Screening - Comments:: No hearing issues Vision Screening - Comments:: Patient wears glasses   Goals Addressed             This Visit's Progress    Patient Stated   On track    Stay healthy       Depression Screen     01/28/2024    1:24 PM 11/12/2023   11:19 AM 01/14/2023    9:32 AM 01/11/2022    9:27 AM 06/15/2021    1:47 PM 11/10/2020   11:52 AM 02/25/2020   10:29 AM  PHQ 2/9 Scores  PHQ - 2 Score 0 0 0 0 0 0 0  PHQ- 9 Score 0          Fall Risk     01/28/2024    1:22 PM 11/12/2023   11:19 AM 01/14/2023    9:34 AM 01/11/2022    9:28 AM 11/10/2020   11:55 AM  Fall Risk   Falls in the past year? 1 1 1  0 0  Number falls in past yr: 0 1 1 0 0  Injury with Fall? 1 0 1 0 0  Comment   knees    Risk for fall due to : History of fall(s) Impaired balance/gait Impaired vision Impaired vision   Follow up Falls evaluation completed Falls evaluation completed Falls prevention discussed Falls prevention discussed Falls prevention discussed    MEDICARE RISK AT HOME:  Medicare Risk at Home Any stairs in or around the home?: Yes If so,  are there any without handrails?: No Home free of loose throw rugs in walkways, pet beds, electrical cords, etc?: Yes Adequate lighting in your home to reduce risk of falls?: Yes Life alert?: No Use of a cane, walker or w/c?: No Grab bars in the bathroom?: Yes Shower chair or bench in shower?: No Elevated toilet seat or a handicapped  toilet?: No  TIMED UP AND GO:  Was the test performed?  No  Cognitive Function: 6CIT completed        01/28/2024    1:21 PM 11/10/2020   11:56 AM  6CIT Screen  What Year? 0 points 0 points  What month? 0 points 0 points  What time? 0 points   Count back from 20 4 points 0 points  Months in reverse 4 points 4 points  Repeat phrase 8 points 0 points  Total Score 16 points     Immunizations Immunization History  Administered Date(s) Administered   PNEUMOCOCCAL CONJUGATE-20 04/05/2023   Pneumococcal Conjugate-13 09/01/2020   Zoster Recombinant(Shingrix) 04/05/2023    Screening Tests Health Maintenance  Topic Date Due   Zoster Vaccines- Shingrix (2 of 2) 02/10/2024 (Originally 05/31/2023)   DTaP/Tdap/Td (1 - Tdap) 11/11/2024 (Originally 07/09/1965)   INFLUENZA VACCINE  05/21/2024   Medicare Annual Wellness (AWV)  01/27/2025   DEXA SCAN  11/19/2026   Pneumonia Vaccine 81+ Years old  Completed   Hepatitis C Screening  Completed   HPV VACCINES  Aged Out   Colonoscopy  Discontinued   COVID-19 Vaccine  Discontinued    Health Maintenance  There are no preventive care reminders to display for this patient. Health Maintenance Items Addressed:   Additional Screening:  Vision Screening: Recommended annual ophthalmology exams for early detection of glaucoma and other disorders of the eye.  Dental Screening: Recommended annual dental exams for proper oral hygiene  Community Resource Referral / Chronic Care Management: CRR required this visit?  No   CCM required this visit?  No     Plan:     I have personally reviewed and noted the following in the patient's chart:   Medical and social history Use of alcohol, tobacco or illicit drugs  Current medications and supplements including opioid prescriptions. Patient is not currently taking opioid prescriptions. Functional ability and status Nutritional status Physical activity Advanced directives List of other  physicians Hospitalizations, surgeries, and ER visits in previous 12 months Vitals Screenings to include cognitive, depression, and falls Referrals and appointments  In addition, I have reviewed and discussed with patient certain preventive protocols, quality metrics, and best practice recommendations. A written personalized care plan for preventive services as well as general preventive health recommendations were provided to patient.     Rudi Heap, [place]   01/28/2024   After Visit Summary: (MyChart) Due to this being a telephonic visit, the after visit summary with patients personalized plan was offered to patient via MyChart   Notes: Nothing significant to report at this time.

## 2024-04-01 DIAGNOSIS — H40013 Open angle with borderline findings, low risk, bilateral: Secondary | ICD-10-CM | POA: Diagnosis not present

## 2024-04-01 DIAGNOSIS — H26493 Other secondary cataract, bilateral: Secondary | ICD-10-CM | POA: Diagnosis not present

## 2024-04-19 ENCOUNTER — Ambulatory Visit (INDEPENDENT_AMBULATORY_CARE_PROVIDER_SITE_OTHER): Admitting: "Endocrinology

## 2024-04-19 ENCOUNTER — Encounter: Payer: Self-pay | Admitting: "Endocrinology

## 2024-04-19 VITALS — BP 122/80 | HR 70 | Ht 61.0 in | Wt 146.0 lb

## 2024-04-19 DIAGNOSIS — M810 Age-related osteoporosis without current pathological fracture: Secondary | ICD-10-CM | POA: Diagnosis not present

## 2024-04-19 NOTE — Progress Notes (Signed)
 [facility] Note Kendra Birmingham, MD    Referring Provider: Job Lukes, PA Primary Care Provider: Job Lukes, PA No chief complaint on file.   Assessment & Plan  Diagnoses and all orders for this visit:  Age-related osteoporosis without current pathological fracture -     Comprehensive metabolic panel with GFR -     VITAMIN D  25 Hydroxy (Vit-D Deficiency, Fractures)    Osteoporosis Likely secondary cause from age  + post treatment for breast cancer (10/17/2011 - Anti-estrogen oral therapy Arimidex  1mg  dialy, switched to Aromasin  in 02/2015 due to arthritis, and switched to Tamoxifen  in 06/2015). BMD results suggest: Lumbar spine L1-L4 (L3) -2.8 . Last prolia  in 02/2017, started since 02/2014 given after she couldn't tolerate fosamax  and had breat cancer. In 2025 per notes,  Prolia  was not approved because her insurance requires her to have failed another medication, such as an IV medication, to help with her bone density .Patient was previously on Fosamax  but had difficulty tolerating it, took if before 2015, unsure of length of time. Recommend to use calcium  600 mg twice daily and vitamin D  2000 units OTC supplements.   Educated on risks and side effects of relcast including but not limited to atypical femoral fractures and osteonecrosis of the jaw. Will avoid fosamax  given GERD treated by medication and history of poor tolerance to fosamax  in past.  Advised fall precautions, adequate dairy in diet and exercises (aerobic, balancing and weight bearing) as tolerated.  Labs today, if WNL will proceed with Reclast. Explained S/E in detail include mild-severe flu-like symptoms after. Patients understands and agreed.    Return in about 3 months (around 07/20/2024) for labs today.  I have reviewed current medications, nurse's notes, allergies, vital signs, past medical and surgical history, family medical history, and social history for this encounter. Counseled  patient on symptoms, examination findings, lab findings, imaging results, treatment decisions and monitoring and prognosis. The patient understood the recommendations and agrees with the treatment plan. All questions regarding treatment plan were fully answered.   Kendra Birmingham, MD   04/19/24    History of Present Illness Kendra Schultz is a 78 y.o. year old female who presents to our clinic with osteoporosis diagnosed in 2015?  Last prolia  in 02/2017, started since 02/2014 given after she couldn't tolerate fosamax  and had breat cancer. In 2025 per notes,  Prolia  was not approved because her insurance requires her to have failed another medication, such as an IV medication, to help with her bone density .Patient was previously on Fosamax  but had difficulty tolerating it, took if before 2015, unsure of length of time.   Risk Factors screening:  History of low trauma fractures: No Family history of osteoporosis: No Hip fracture in first-degree relatives: Yes, mother  Smoking history: Yes Excessive alcohol intake >2 drinks/day: No Excessive caffeine intake >2 drinks/day: No Glucocorticoid use >5mg  prednisone /day for >3 months: No Rheumatoid arthritis history: No Premature/Surgical Menopause: No   Anti-epileptic drugs No  Celiac disease/signs of malabsorption No  Gastric bypass/gastrectomy No  PPI use yes, on pepcid    TZD use No  Gonadotropin/androgen suppressing drugs No  Aromatase Inhibitors No  Thyroid hormone suppressive therapy No  Physical Exam  BP 122/80   Pulse 70   Ht 5' 1 (1.549 m)   Wt 146 lb (66.2 kg)   LMP  (LMP Unknown) Comment: LMP 1998  SpO2 95%   BMI 27.59 kg/m  Constitutional: well developed, well nourished Head: normocephalic, atraumatic Eyes:  sclera anicteric, no redness Neck: supple Lungs: normal respiratory effort Neurology: alert and oriented Skin: dry, no appreciable rashes Musculoskeletal: no appreciable defects Psychiatric: normal mood and  affect  Allergies Allergies  Allergen Reactions   Cephalexin Itching and Nausea And Vomiting    Current Medications Patient's Medications  New Prescriptions   No medications on file  Previous Medications   ALBUTEROL  (VENTOLIN  HFA) 108 (90 BASE) MCG/ACT INHALER    INHALE 2 PUFFS BY MOUTH INTO THE LUNGS EVERY 6 HOURS AS NEEDED FOR WHEEZING   ATORVASTATIN  (LIPITOR) 20 MG TABLET    Take 1 tablet (20 mg total) by mouth daily.   BACLOFEN  (LIORESAL ) 10 MG TABLET    Take 1 tablet (10 mg total) by mouth 2 (two) times daily as needed for muscle spasms.   BENAZEPRIL -HYDROCHLORTHIAZIDE (LOTENSIN  HCT) 20-25 MG TABLET    Take 1 tablet by mouth daily.   BREO ELLIPTA  100-25 MCG/ACT AEPB    Inhale 1 puff into the lungs daily.   CALCIUM -PHOSPHORUS-VITAMIN D  (CITRACAL +D3 PO)    Take 2 tablets by mouth daily.   CHOLECALCIFEROL (VITAMIN D3) 50 MCG (2000 UT) TABS    Take 2,000 Units by mouth daily.   GABAPENTIN  (NEURONTIN ) 100 MG CAPSULE    Take 1 capsule (100 mg total) by mouth daily as needed. Take 1 capsule daily as needed for pain.   IBUPROFEN (ADVIL,MOTRIN) 200 MG TABLET    Take 400 mg by mouth every 6 (six) hours as needed. For pain   OMEPRAZOLE  (PRILOSEC) 20 MG CAPSULE    Take 1 capsule (20 mg total) by mouth daily.  Modified Medications   No medications on file  Discontinued Medications   No medications on file     Past Medical History Past Medical History:  Diagnosis Date   Anemia    Arthritis    Cataract    surgery to remove   GERD (gastroesophageal reflux disease)    Hyperlipidemia    Hypertension    Osteoporosis     Past Surgical History Past Surgical History:  Procedure Laterality Date   APPENDECTOMY     BREAST LUMPECTOMY  1997   right   CATARACT EXTRACTION, BILATERAL     ESOPHAGOGASTRODUODENOSCOPY     HYSTEROSCOPY WITH D & C N/A 06/01/2019   Procedure: DILATATION AND CURETTAGE /HYSTEROSCOPY;  Surgeon: Eveline Lynwood MATSU, MD;  Location: [facility] OR;  Service: Gynecology;  Laterality:  N/A;   MASTECTOMY  1995   left   MASTECTOMY W/ SENTINEL NODE BIOPSY  09/17/2011   Procedure: MASTECTOMY WITH SENTINEL LYMPH NODE BIOPSY;  Surgeon: Debby LABOR. Cornett, MD;  Location: [facility] OR;  Service: General;  Laterality: Right;  nuclear medicine injection 30 minutes prior for surgery    OVARIAN CYST REMOVAL     TONSILLECTOMY      Family History family history includes Breast cancer in her sister and sister; Cancer in her father; Heart disease in her brother and father.  Social History Social History   Socioeconomic History   Marital status: Divorced    Spouse name: Not on file   Number of children: 2   Years of education: Not on file   Highest education level: Not on file  Occupational History   Occupation: retired  Tobacco Use   Smoking status: Former    Current packs/day: 0.00    Types: Cigarettes    Quit date: 11/22/2011    Years since quitting: 12.4   Smokeless tobacco: Never  Vaping Use   Vaping status:  Never Used  Substance and Sexual Activity   Alcohol use: Not Currently    Comment: occasional wine   Drug use: No   Sexual activity: Not Currently    Birth control/protection: Post-menopausal  Other Topics Concern   Not on file  Social History Narrative   Used to work at [employer]   Two sons -- live with her   Divorced/widowed   Social Drivers of Corporate investment banker Strain: Low Risk  (01/28/2024)   Overall Financial Resource Strain (CARDIA)    Difficulty of Paying Living Expenses: Not hard at all  Food Insecurity: No Food Insecurity (01/28/2024)   Hunger Vital Sign    Worried About Running Out of Food in the Last Year: Never true    Ran Out of Food in the Last Year: Never true  Transportation Needs: No Transportation Needs (01/28/2024)   PRAPARE - Administrator, Civil Service (Medical): No    Lack of Transportation (Non-Medical): No  Physical Activity: Patient Declined (01/28/2024)   Exercise Vital Sign    Days of Exercise per Week: Patient  declined    Minutes of Exercise per Session: Patient declined  Stress: No Stress Concern Present (01/28/2024)   [employer] of Occupational Health - Occupational Stress Questionnaire    Feeling of Stress : Not at all  Social Connections: Socially Isolated (01/28/2024)   Social Connection and Isolation Panel    Frequency of Communication with Friends and Family: More than three times a week    Frequency of Social Gatherings with Friends and Family: More than three times a week    Attends Religious Services: Never    Database administrator or Organizations: No    Attends Banker Meetings: Never    Marital Status: Divorced  Catering manager Violence: Not At Risk (01/28/2024)   Humiliation, Afraid, Rape, and Kick questionnaire    Fear of Current or Ex-Partner: No    Emotionally Abused: No    Physically Abused: No    Sexually Abused: No    Laboratory Investigations No components found for: CMP No components found for: BMP Lab Results  Component Value Date   GFR 72.19 11/12/2023   Lab Results  Component Value Date   CREATININE 0.79 11/12/2023   No results found for: CBC No components found for: LFT No components found for: VITD No results found for: PTH  No results found for: TSH  No components found for: RENAL FUNCTION No components found for: MAGNESIUM   Parts of this note may have been dictated using voice recognition software. There may be variances in spelling and vocabulary which are unintentional. Not all errors are proofread. Please notify the dino if any discrepancies are noted or if the meaning of any statement is not clear.

## 2024-04-20 LAB — COMPREHENSIVE METABOLIC PANEL WITH GFR
AG Ratio: 1.6 (calc) (ref 1.0–2.5)
ALT: 15 U/L (ref 6–29)
AST: 16 U/L (ref 10–35)
Albumin: 4.5 g/dL (ref 3.6–5.1)
Alkaline phosphatase (APISO): 76 U/L (ref 37–153)
BUN: 22 mg/dL (ref 7–25)
CO2: 28 mmol/L (ref 20–32)
Calcium: 10.1 mg/dL (ref 8.6–10.4)
Chloride: 104 mmol/L (ref 98–110)
Creat: 0.92 mg/dL (ref 0.60–1.00)
Globulin: 2.9 g/dL (ref 1.9–3.7)
Glucose, Bld: 104 mg/dL (ref 65–139)
Potassium: 4.3 mmol/L (ref 3.5–5.3)
Sodium: 142 mmol/L (ref 135–146)
Total Bilirubin: 0.5 mg/dL (ref 0.2–1.2)
Total Protein: 7.4 g/dL (ref 6.1–8.1)
eGFR: 64 mL/min/{1.73_m2} (ref >=60)

## 2024-04-20 LAB — VITAMIN D 25 HYDROXY (VIT D DEFICIENCY, FRACTURES): Vit D, 25-Hydroxy: 76 ng/mL (ref 30–100)

## 2024-05-03 ENCOUNTER — Ambulatory Visit: Payer: Self-pay | Admitting: "Endocrinology

## 2024-05-03 ENCOUNTER — Other Ambulatory Visit: Payer: Self-pay | Admitting: "Endocrinology

## 2024-05-03 ENCOUNTER — Telehealth: Payer: Self-pay

## 2024-05-03 DIAGNOSIS — M810 Age-related osteoporosis without current pathological fracture: Secondary | ICD-10-CM | POA: Insufficient documentation

## 2024-05-03 NOTE — Telephone Encounter (Signed)
 Dr. Motwani, patient will be scheduled as soon as possible.  Auth Submission: NO AUTH NEEDED Site of care: Site of care: [facility] Payer: [employer] dual complete medicare Medication & CPT/J Code(s) submitted: Reclast (Zolendronic acid) [id number] Diagnosis Code:  Route of submission (phone, fax, portal):  Phone # Fax # Auth type: Buy/Bill PB Units/visits requested: 5mg  x 1 dose Reference number:  Approval from: 05/03/24 to 09/03/24

## 2024-06-16 DIAGNOSIS — H26493 Other secondary cataract, bilateral: Secondary | ICD-10-CM | POA: Diagnosis not present

## 2024-07-20 ENCOUNTER — Ambulatory Visit: Admitting: "Endocrinology

## 2024-07-23 ENCOUNTER — Ambulatory Visit: Admitting: "Endocrinology

## 2024-08-25 ENCOUNTER — Encounter: Payer: Self-pay | Admitting: "Endocrinology

## 2024-08-25 ENCOUNTER — Ambulatory Visit (INDEPENDENT_AMBULATORY_CARE_PROVIDER_SITE_OTHER): Admitting: "Endocrinology

## 2024-08-25 VITALS — BP 120/80 | HR 77 | Ht 61.0 in | Wt 147.0 lb

## 2024-08-25 DIAGNOSIS — M810 Age-related osteoporosis without current pathological fracture: Secondary | ICD-10-CM | POA: Diagnosis not present

## 2024-08-25 MED ORDER — ALENDRONATE SODIUM 70 MG PO TABS: 70.0000 mg | ORAL_TABLET | ORAL | 3 refills | Status: AC

## 2024-08-25 NOTE — Progress Notes (Signed)
 [facility] Note Kendra Birmingham, MD    Referring Provider: Job Lukes, PA Primary Care Provider: Job Lukes, PA No chief complaint on file.   Assessment & Plan  Diagnoses and all orders for this visit:  Age-related osteoporosis without current pathological fracture  Other orders -     alendronate  (FOSAMAX ) 70 MG tablet; Take 1 tablet (70 mg total) by mouth every 7 (seven) days. Take with a full glass of water on an empty stomach.     Osteoporosis Likely secondary cause from age  + post treatment for breast cancer (10/17/2011 - Anti-estrogen oral therapy Arimidex  1mg  daily, switched to Aromasin  in 02/2015 due to arthritis, and switched to Tamoxifen  in 06/2015). BMD results suggest: Lumbar spine L1-L4 (L3) -2.8 . Last prolia  in 02/2017, started since 02/2014 given after she couldn't tolerate fosamax  and had breast cancer.   In 2025 per notes,  Prolia  was not approved because her insurance requires her to have failed another medication, such as an IV medication, to help with her bone density . Patient was previously on Fosamax  but had difficulty tolerating it, took if before 2015, unsure of length of time. Recommend to use calcium  600 mg twice daily and vitamin D  2000 units OTC supplements.    08/25/24: Patient does not need recall her issues with Fosamax  and would like to start it again. She is not in favor of any injectables or infusion.  Reclast was ordered for her but she refused the infusion department.  Will follow-up on the patient in 2 weeks through the staff to make sure she is not having any issues and she will see me in 3 months.  Educated on risks and side effects of relcast including but not limited to atypical femoral fractures and osteonecrosis of the jaw.   Advised fall precautions, adequate dairy in diet and exercises (aerobic, balancing and weight bearing) as tolerated.  Return in about 3 months (around 11/25/2024).  I have reviewed current  medications, nurse's notes, allergies, vital signs, past medical and surgical history, family medical history, and social history for this encounter. Counseled patient on symptoms, examination findings, lab findings, imaging results, treatment decisions and monitoring and prognosis. The patient understood the recommendations and agrees with the treatment plan. All questions regarding treatment plan were fully answered.   Kendra Birmingham, MD   08/25/24    History of Present Illness Kendra Schultz is a 78 y.o. year old female who presents to our clinic with osteoporosis diagnosed in 2015?  Last prolia  in 02/2017, started since 02/2014 given after she couldn't tolerate fosamax  and had breat cancer. In 2025 per notes,  Prolia  was not approved because her insurance requires her to have failed another medication, such as an IV medication, to help with her bone density .Patient was previously on Fosamax  but had difficulty tolerating it, took if before 2015, unsure of length of time.   No falls/fractures/bone pains Takes calcium  600 mg 2 pills in morning  Takes vitamin D  2000 units OTC No active dental issues, sees dentist Taken fosamax  and prolia  in past   Risk Factors screening:  History of low trauma fractures: No Family history of osteoporosis: No Hip fracture in first-degree relatives: Yes, mother  Smoking history: Yes Excessive alcohol intake >2 drinks/day: No Excessive caffeine intake >2 drinks/day: No Glucocorticoid use >5mg  prednisone /day for >3 months: No Rheumatoid arthritis history: No Premature/Surgical Menopause: No   Anti-epileptic drugs No  Celiac disease/signs of malabsorption No  Gastric bypass/gastrectomy No  PPI use yes, on pepcid    TZD use No  Gonadotropin/androgen suppressing drugs No  Aromatase Inhibitors No  Thyroid hormone suppressive therapy No  Physical Exam  BP 120/80   Pulse 77   Ht 5' 1 (1.549 m)   Wt 147 lb (66.7 kg)   LMP  (LMP Unknown) Comment:  LMP 1998  SpO2 96%   BMI 27.78 kg/m  Constitutional: well developed, well nourished Head: normocephalic, atraumatic Eyes: sclera anicteric, no redness Neck: supple Lungs: normal respiratory effort Neurology: alert and oriented Skin: dry, no appreciable rashes Musculoskeletal: no appreciable defects Psychiatric: normal mood and affect  Allergies Allergies  Allergen Reactions   Cephalexin Itching and Nausea And Vomiting    Current Medications Patient's Medications  New Prescriptions   ALENDRONATE  (FOSAMAX ) 70 MG TABLET    Take 1 tablet (70 mg total) by mouth every 7 (seven) days. Take with a full glass of water on an empty stomach.  Previous Medications   ALBUTEROL  (VENTOLIN  HFA) 108 (90 BASE) MCG/ACT INHALER    INHALE 2 PUFFS BY MOUTH INTO THE LUNGS EVERY 6 HOURS AS NEEDED FOR WHEEZING   ATORVASTATIN  (LIPITOR) 20 MG TABLET    Take 1 tablet (20 mg total) by mouth daily.   BACLOFEN  (LIORESAL ) 10 MG TABLET    Take 1 tablet (10 mg total) by mouth 2 (two) times daily as needed for muscle spasms.   BENAZEPRIL -HYDROCHLORTHIAZIDE (LOTENSIN  HCT) 20-25 MG TABLET    Take 1 tablet by mouth daily.   BREO ELLIPTA  100-25 MCG/ACT AEPB    Inhale 1 puff into the lungs daily.   CALCIUM -PHOSPHORUS-VITAMIN D  (CITRACAL +D3 PO)    Take 2 tablets by mouth daily.   CHOLECALCIFEROL (VITAMIN D3) 50 MCG (2000 UT) TABS    Take 2,000 Units by mouth daily.   GABAPENTIN  (NEURONTIN ) 100 MG CAPSULE    Take 1 capsule (100 mg total) by mouth daily as needed. Take 1 capsule daily as needed for pain.   IBUPROFEN (ADVIL,MOTRIN) 200 MG TABLET    Take 400 mg by mouth every 6 (six) hours as needed. For pain   OMEPRAZOLE  (PRILOSEC) 20 MG CAPSULE    Take 1 capsule (20 mg total) by mouth daily.  Modified Medications   No medications on file  Discontinued Medications   No medications on file     Past Medical History Past Medical History:  Diagnosis Date   Anemia    Arthritis    Cataract    surgery to remove   GERD  (gastroesophageal reflux disease)    Hyperlipidemia    Hypertension    Osteoporosis     Past Surgical History Past Surgical History:  Procedure Laterality Date   APPENDECTOMY     BREAST LUMPECTOMY  1997   right   CATARACT EXTRACTION, BILATERAL     ESOPHAGOGASTRODUODENOSCOPY     HYSTEROSCOPY WITH D & C N/A 06/01/2019   Procedure: DILATATION AND CURETTAGE /HYSTEROSCOPY;  Surgeon: Eveline Lynwood MATSU, MD;  Location: [facility] OR;  Service: Gynecology;  Laterality: N/A;   MASTECTOMY  1995   left   MASTECTOMY W/ SENTINEL NODE BIOPSY  09/17/2011   Procedure: MASTECTOMY WITH SENTINEL LYMPH NODE BIOPSY;  Surgeon: Debby LABOR. Cornett, MD;  Location: [facility] OR;  Service: General;  Laterality: Right;  nuclear medicine injection 30 minutes prior for surgery    OVARIAN CYST REMOVAL     TONSILLECTOMY      Family History family history includes Breast cancer in her sister and sister; Cancer in  her father; Heart disease in her brother and father.  Social History Social History   Socioeconomic History   Marital status: Divorced    Spouse name: Not on file   Number of children: 2   Years of education: Not on file   Highest education level: Not on file  Occupational History   Occupation: retired  Tobacco Use   Smoking status: Former    Current packs/day: 0.00    Types: Cigarettes    Quit date: 11/22/2011    Years since quitting: 12.7   Smokeless tobacco: Never  Vaping Use   Vaping status: Never Used  Substance and Sexual Activity   Alcohol use: Not Currently    Comment: occasional wine   Drug use: No   Sexual activity: Not Currently    Birth control/protection: Post-menopausal  Other Topics Concern   Not on file  Social History Narrative   Used to work at [employer]   Two sons -- live with her   Divorced/widowed   Social Drivers of Corporate Investment Banker Strain: Low Risk  (01/28/2024)   Overall Financial Resource Strain (CARDIA)    Difficulty of Paying Living Expenses: Not hard at all   Food Insecurity: No Food Insecurity (01/28/2024)   Hunger Vital Sign    Worried About Running Out of Food in the Last Year: Never true    Ran Out of Food in the Last Year: Never true  Transportation Needs: No Transportation Needs (01/28/2024)   PRAPARE - Administrator, Civil Service (Medical): No    Lack of Transportation (Non-Medical): No  Physical Activity: Patient Declined (01/28/2024)   Exercise Vital Sign    Days of Exercise per Week: Patient declined    Minutes of Exercise per Session: Patient declined  Stress: No Stress Concern Present (01/28/2024)   [employer] of Occupational Health - Occupational Stress Questionnaire    Feeling of Stress : Not at all  Social Connections: Socially Isolated (01/28/2024)   Social Connection and Isolation Panel    Frequency of Communication with Friends and Family: More than three times a week    Frequency of Social Gatherings with Friends and Family: More than three times a week    Attends Religious Services: Never    Database Administrator or Organizations: No    Attends Banker Meetings: Never    Marital Status: Divorced  Catering Manager Violence: Not At Risk (01/28/2024)   Humiliation, Afraid, Rape, and Kick questionnaire    Fear of Current or Ex-Partner: No    Emotionally Abused: No    Physically Abused: No    Sexually Abused: No    Laboratory Investigations No components found for: CMP No components found for: BMP Lab Results  Component Value Date   GFR 72.19 11/12/2023   Lab Results  Component Value Date   CREATININE 0.92 04/19/2024   No results found for: CBC No components found for: LFT No components found for: VITD No results found for: PTH  No results found for: TSH  No components found for: RENAL FUNCTION No components found for: MAGNESIUM   Parts of this note may have been dictated using voice recognition software. There may be variances in spelling and vocabulary which are  unintentional. Not all errors are proofread. Please notify the dino if any discrepancies are noted or if the meaning of any statement is not clear.

## 2024-10-08 ENCOUNTER — Other Ambulatory Visit: Payer: Self-pay

## 2024-10-08 ENCOUNTER — Encounter (HOSPITAL_COMMUNITY): Payer: Self-pay

## 2024-10-08 ENCOUNTER — Emergency Department (HOSPITAL_COMMUNITY)

## 2024-10-08 ENCOUNTER — Ambulatory Visit: Payer: Self-pay

## 2024-10-08 ENCOUNTER — Emergency Department (HOSPITAL_COMMUNITY)
Admission: EM | Admit: 2024-10-08 | Discharge: 2024-10-08 | Disposition: A | Discharge status: Home / Self Care | Source: Ambulatory Visit | Attending: Emergency Medicine | Admitting: Emergency Medicine

## 2024-10-08 DIAGNOSIS — Z79899 Other long term (current) drug therapy: Secondary | ICD-10-CM | POA: Insufficient documentation

## 2024-10-08 DIAGNOSIS — M25511 Pain in right shoulder: Secondary | ICD-10-CM | POA: Diagnosis present

## 2024-10-08 DIAGNOSIS — M79601 Pain in right arm: Secondary | ICD-10-CM | POA: Insufficient documentation

## 2024-10-08 NOTE — ED Triage Notes (Signed)
 Patient has right shoulder pain that moves into her right armpit. No falls. Has been hurting for 2 weeks.

## 2024-10-08 NOTE — ED Provider Notes (Signed)
 " [place] [facility] AT [facility] Provider Note   CSN: [id number] Arrival date & time: 10/08/24  1515     Patient presents with: Shoulder Pain   Kendra Schultz is a 78 y.o. female.   78 year old female presenting with right shoulder/right upper extremity pain.  Patient notes symptoms been ongoing for 3 weeks, denies any known trauma/inciting event but reports that the pain just began all of a sudden but is exacerbated by specific movements.  Denies chest pain or shortness of breath.  Has been using ibuprofen which has helped alleviate her symptoms.  History of osteoporosis.   Shoulder Pain      Prior to Admission medications  Medication Sig Start Date End Date Taking? Authorizing Provider  albuterol  (VENTOLIN  HFA) 108 (90 Base) MCG/ACT inhaler INHALE 2 PUFFS BY MOUTH INTO THE LUNGS EVERY 6 HOURS AS NEEDED FOR WHEEZING 11/12/23   Job Lukes, PA  alendronate  (FOSAMAX ) 70 MG tablet Take 1 tablet (70 mg total) by mouth every 7 (seven) days. Take with a full glass of water on an empty stomach. 08/25/24   Komal, Motwani, MD  atorvastatin  (LIPITOR) 20 MG tablet Take 1 tablet (20 mg total) by mouth daily. 11/12/23   Job Lukes, PA  baclofen  (LIORESAL ) 10 MG tablet Take 1 tablet (10 mg total) by mouth 2 (two) times daily as needed for muscle spasms. 11/12/23   Job Lukes, PA  benazepril -hydrochlorthiazide (LOTENSIN  HCT) 20-25 MG tablet Take 1 tablet by mouth daily. 11/12/23   Job Lukes, PA  BREO ELLIPTA  100-25 MCG/ACT AEPB Inhale 1 puff into the lungs daily. 11/12/23   Job Lukes, PA  Calcium -Phosphorus-Vitamin D  (CITRACAL +D3 PO) Take 2 tablets by mouth daily.    [provider]  Cholecalciferol (VITAMIN D3) 50 MCG (2000 UT) TABS Take 2,000 Units by mouth daily.    [provider]  gabapentin  (NEURONTIN ) 100 MG capsule Take 1 capsule (100 mg total) by mouth daily as needed. Take 1 capsule daily as needed for pain. 11/12/23    Job Lukes, PA  ibuprofen (ADVIL,MOTRIN) 200 MG tablet Take 400 mg by mouth every 6 (six) hours as needed. For pain    [provider]  omeprazole  (PRILOSEC) 20 MG capsule Take 1 capsule (20 mg total) by mouth daily. 11/12/23   Job Lukes, PA    Allergies: Cephalexin    Review of Systems  Updated Vital Signs  Vitals:   10/08/24 1524 10/08/24 1539 10/08/24 1814  BP: (!) 151/92  (!) 140/80  Pulse: (!) 103  65  Resp: 19  15  Temp: 98.4 F (36.9 C)  98.4 F (36.9 C)  TempSrc: Oral    SpO2: 93%  96%  Weight:  67 kg   Height:  5' (1.524 m)      Physical Exam Vitals and nursing note reviewed.  HENT:     Head: Normocephalic.  Eyes:     Extraocular Movements: Extraocular movements intact.  Cardiovascular:     Rate and Rhythm: Normal rate.  Pulmonary:     Effort: Pulmonary effort is normal.  Musculoskeletal:     Cervical back: Normal range of motion.     Comments: Moves all extremities spontaneously without difficulty RUE: Full passive range of motion.  Active range of motion is limited, including overhead reach/abduction/internal rotation.  No bony deformity or swelling of the joints.  Skin:    General: Skin is warm and dry.     Findings: No bruising, erythema, lesion or rash.  Neurological:  Mental Status: She is alert and oriented to person, place, and time.     (all labs ordered are listed, but only abnormal results are displayed) Labs Reviewed - No data to display  EKG: None  Radiology: DG Shoulder Right Result Date: 10/08/2024 CLINICAL DATA:  Right shoulder pain for 3 weeks. EXAM: DG SHOULDER 2+V*R* COMPARISON:  None Available. FINDINGS: There is no evidence of fracture or dislocation. Moderate severity degenerative changes are seen involving the right acromioclavicular joint. Soft tissues are unremarkable. IMPRESSION: Negative. Electronically Signed   By: Suzen Dials M.D.   On: 10/08/2024 16:49     Procedures   Medications  Ordered in the ED - No data to display                                  Medical Decision Making This patient presents to the ED for concern of shoulder pain, this involves an extensive number of treatment options, and is a complaint that carries with it a high risk of complications and morbidity.  The differential diagnosis includes fracture, dislocation, ligamentous versus tendinous injury, osteoarthritis, ACS   Co morbidities that complicate the patient evaluation  Osteoporosis   Additional history obtained:  Additional history obtained from record review External records from outside source obtained and reviewed including prior endocrinology note   Imaging Studies ordered:  I ordered imaging studies including R shoulder XR  I independently visualized and interpreted imaging which showed Negative.  I agree with the radiologist interpretation   Problem List / ED Course / Critical interventions / Medication management I have reviewed the patients home medicines and have made adjustments as needed   Social Determinants of Health:  Tobacco use   Test / Admission - Considered:  Physical exam is notable as above, patient's active range of motion is limited specifically when demonstrating overhead reach/internal rotation/abduction.  X-ray is negative for acute fracture/dislocation but does show degenerative changes of the acromioclavicular joint.  There is no appreciable joint swelling/erythema/bruising/deformity nor other any rashes/lesions.   Patient may be demonstrating signs/symptoms consistent with a rotator cuff injury, I recommend that she follow-up with an orthopedic specialist in regard to her symptoms.  Patient was tachycardic at time of initial presentation but this resolved at time of my evaluation, I do not suspect that her symptoms are attributed to ACS today given that she does not have any chest pain or shortness of breath and given the fact that her symptoms been  ongoing for 3 weeks and are exacerbated by movement and improved with use of NSAIDs.  I recommend that she continue ibuprofen/Tylenol  as needed until she is able to follow-up with an orthopedic specialist.  She voiced understanding is in agreement with this plan, return precautions discussed, she is appropriate for discharge at this time.   Staffed with Dr. Ellouise  Amount and/or Complexity of Data Reviewed Radiology: ordered.        Final diagnoses:  Right shoulder pain, unspecified chronicity    ED Discharge Orders     None          Glendia Rocky SAILOR, PA-C 10/08/24 1820    Ellouise Richerd POUR, DO 10/08/24 2042  "

## 2024-10-08 NOTE — Telephone Encounter (Signed)
 Noted pt being seen at the ED

## 2024-10-08 NOTE — Telephone Encounter (Signed)
 FYI Only or Action Required?: FYI only for provider: ED advised.  Patient was last seen in primary care on 11/12/2023 by Job Lukes, PA.  Called Nurse Triage reporting Pain.  Symptoms began a week ago.  Interventions attempted: OTC medications: advil.  Symptoms are: gradually worsening.  Triage Disposition: Go to ED Now (or PCP Triage)  Patient/caregiver understands and will follow disposition?: Yes Reason for Disposition  [1] SEVERE pain (e.g., excruciating) AND [2] not improved 2 hours after pain medicine  Answer Assessment - Initial Assessment Questions Advised ED now. Patient reports will drive, nurse advised against driving and offered to call 911. Patient declines 911 and reports I will have my sister to drive me; [facility].  Advised 911 if symptoms worsen: severe diff breathing, weakness/faint, chest pain or worsen symptoms. Patient verbalized understanding.  1. ONSET: When did the pain start?     3 weeks; got worse today with underarm pain 2. LOCATION: Where is the pain located?     Currently Right under arm, shoulder travels to wrist and elbow, upper back between shoulders 3. PAIN: How bad is the pain? (Scale 0-10; or none, mild, moderate, severe)     8/10; took Advil at 1200 4. WORK OR EXERCISE: Has there been any recent work or exercise that involved this part of the body?     no 5. CAUSE: What do you think is causing the arm pain?     unsure 6. OTHER SYMPTOMS: Do you have any other symptoms? (e.g., neck pain, swelling, rash, fever, numbness, weakness)     Diarrhea all week Denies fever, chills, n/v, tingling  Protocols used: Arm Pain-A-AH

## 2024-10-08 NOTE — Discharge Instructions (Addendum)
 The x-ray of your shoulder today does not show any evidence of a fracture or dislocation, but does show some degenerative changes at your acromioclavicular joint.  Continue Tylenol /ibuprofen as needed for pain.  I have provided you with contact information for an orthopedic specialist, please contact their office to schedule follow-up if your symptoms persist.  Return to the emergency department if your symptoms worsen.

## 2024-10-11 ENCOUNTER — Ambulatory Visit: Admitting: Physician Assistant

## 2024-10-11 ENCOUNTER — Encounter: Payer: Self-pay | Admitting: Physician Assistant

## 2024-10-11 VITALS — BP 138/78 | HR 88 | Temp 97.8°F | Ht 60.0 in | Wt 142.0 lb

## 2024-10-11 DIAGNOSIS — R4189 Other symptoms and signs involving cognitive functions and awareness: Secondary | ICD-10-CM | POA: Diagnosis not present

## 2024-10-11 DIAGNOSIS — Z1211 Encounter for screening for malignant neoplasm of colon: Secondary | ICD-10-CM | POA: Diagnosis not present

## 2024-10-11 DIAGNOSIS — M79621 Pain in right upper arm: Secondary | ICD-10-CM | POA: Diagnosis not present

## 2024-10-11 DIAGNOSIS — R194 Change in bowel habit: Secondary | ICD-10-CM | POA: Diagnosis not present

## 2024-10-11 DIAGNOSIS — M810 Age-related osteoporosis without current pathological fracture: Secondary | ICD-10-CM

## 2024-10-11 NOTE — Patient Instructions (Signed)
" °  VISIT SUMMARY: During your visit, we discussed your right shoulder and axillary pain, hand numbness, diarrhea, and concerns about memory loss. We have planned several evaluations to determine the underlying causes and ensure your overall health.  YOUR PLAN: RIGHT AXILLARY AND SHOULDER PAIN WITH PARESTHESIA: You have been experiencing pain and numbness in your right shoulder and hand, which could be related to your history of breast cancer. -We have ordered an ultrasound of your right axillary region to check for any masses or lymph node involvement. -We have also ordered blood work to investigate any underlying causes.  DIARRHEA AND POOR APPETITE: You have been having chronic diarrhea and poor appetite, and given your history of a precancerous polyp, we need to rule out colon cancer. -We encourage you to follow up with Dr. Avram for a colonoscopy ASAP.  OSTEOPOROSIS: You previously took alendronate  for osteoporosis but had to stop due to side effects. -We will consider alternative treatments if needed.  MEMORY LOSS: You have reported increasing forgetfulness, and we need to evaluate this further to rule out conditions like Alzheimer's. -We have referred you for a cognitive evaluation.                      Contains text generated by Abridge.                                 Contains text generated by Abridge.   "

## 2024-10-11 NOTE — Progress Notes (Signed)
 "  History of Present Illness:   Chief Complaint  Patient presents with   Shoulder Pain    No known injury of right shoulder. Pain for 3+weeks. Was seen ED at [facility] 12/19. Nothing was really done. Pain is around a 5 sitting. Pain varies when doing activities. Can't left things.  Does have some numbness and tingling in hand that is constant.     Discussed the use of AI scribe software for clinical note transcription with the patient, who gave verbal consent to proceed.  History of Present Illness   Kendra Schultz is a 78 year old female with a history of breast cancer who presents with right shoulder and axillary pain, hand numbness, and diarrhea.  She has had progressively worsening right shoulder and axillary pain with predominantly right-sided hand numbness for at least three weeks. The pain varies in intensity, with some days severe. She denies any injury or clear trigger.  She has daily very loose stools with poor appetite and nausea. She denies abdominal pain with eating. She takes omeprazole  daily yet still has significant gas. Her last colonoscopy was in 2022 with polyp removal, and a repeat was recommended for 2025. She is overdue for this.  She had left mastectomy in 1995 and right mastectomy with lumpectomy in 2012, with negative margins and one negative node. She previously took tamoxifen . BRCA testing was negative. She has not followed with oncology since 2018.  She took alendronate  twice in November 2025 and then stopped it because of hand and foot numbness and severe bone pain.  She is concerned about increasing forgetfulness, such as leaving items in the oven and forgetting tasks, but denies problems with driving.       Past Medical History:  Diagnosis Date   Anemia    Arthritis    Cataract    surgery to remove   GERD (gastroesophageal reflux disease)    Hyperlipidemia    Hypertension    Osteoporosis      Social History[1]  Past Surgical History:   Procedure Laterality Date   APPENDECTOMY     BREAST LUMPECTOMY  1997   right   CATARACT EXTRACTION, BILATERAL     ESOPHAGOGASTRODUODENOSCOPY     HYSTEROSCOPY WITH D & C N/A 06/01/2019   Procedure: DILATATION AND CURETTAGE /HYSTEROSCOPY;  Surgeon: Eveline Lynwood MATSU, MD;  Location: [facility] OR;  Service: Gynecology;  Laterality: N/A;   MASTECTOMY  1995   left   MASTECTOMY W/ SENTINEL NODE BIOPSY  09/17/2011   Procedure: MASTECTOMY WITH SENTINEL LYMPH NODE BIOPSY;  Surgeon: Debby LABOR. Cornett, MD;  Location: [facility] OR;  Service: General;  Laterality: Right;  nuclear medicine injection 30 minutes prior for surgery    OVARIAN CYST REMOVAL     TONSILLECTOMY      Family History  Problem Relation Age of Onset   Heart disease Father    Cancer Father        lymphoma   Heart disease Brother    Breast cancer Sister    Breast cancer Sister    Colon cancer Neg Hx    Colon polyps Neg Hx    Esophageal cancer Neg Hx    Stomach cancer Neg Hx    Rectal cancer Neg Hx     Allergies[2]  Current Medications:  Current Medications[3]   Review of Systems:   Negative unless otherwise specified per HPI.  Vitals:   Vitals:   10/11/24 1130  BP: 138/78  Pulse: 88  Temp: 97.8 F (36.6  C)  TempSrc: Temporal  SpO2: 97%  Weight: 142 lb (64.4 kg)  Height: 5' (1.524 m)     Body mass index is 27.73 kg/m.  Physical Exam:   Physical Exam Vitals and nursing note reviewed.  Constitutional:      General: She is not in acute distress.    Appearance: She is well-developed. She is not ill-appearing or toxic-appearing.  Cardiovascular:     Rate and Rhythm: Normal rate and regular rhythm.     Pulses: Normal pulses.     Heart sounds: Normal heart sounds, S1 normal and S2 normal.  Pulmonary:     Effort: Pulmonary effort is normal.     Breath sounds: Normal breath sounds.  Lymphadenopathy:     Upper Body:     Right upper body: Axillary adenopathy present.  Skin:    General: Skin is warm  and dry.  Neurological:     Mental Status: She is alert.     GCS: GCS eye subscore is 4. GCS verbal subscore is 5. GCS motor subscore is 6.  Psychiatric:        Speech: Speech normal.        Behavior: Behavior normal. Behavior is cooperative.     Assessment and Plan:   Assessment and Plan    Right axillary tenderness Differential includes possible mass or lymph node involvement due to breast cancer history. - Ordered ultrasound of the right axillary region. - Ordered blood work for underlying causes.  Bowel changes Chronic diarrhea and poor appetite with history of precancerous polyp. - Encouraged follow-up colonoscopy with Dr. Avram -- I will place referral now. Update bloodwork to rule out organic cause  Osteoporosis Previously managed with alendronate , discontinued due to adverse effects. Recommend close follow up with endo for management   Concern about memory Reports of memory loss, concern for cognitive decline. Evaluation needed to rule out Alzheimer's or other disorders. - Referred to neurology for further evaluation   Lucie Buttner, PA-C     [1] Social History Tobacco Use   Smoking status: Every Day    Current packs/day: 0.00    Average packs/day: 1.0 packs/day    Types: Cigarettes    Last attempt to quit: 11/22/2011    Years since quitting: 12.8   Smokeless tobacco: Never  Vaping Use   Vaping status: Never Used  Substance Use Topics   Alcohol use: Not Currently    Comment: occasional wine   Drug use: No  [2] Allergies Allergen Reactions   Cephalexin Itching and Nausea And Vomiting  [3]  Current Outpatient Medications:    albuterol  (VENTOLIN  HFA) 108 (90 Base) MCG/ACT inhaler, INHALE 2 PUFFS BY MOUTH INTO THE LUNGS EVERY 6 HOURS AS NEEDED FOR WHEEZING, Disp: 8.5 g, Rfl: 5   atorvastatin  (LIPITOR) 20 MG tablet, Take 1 tablet (20 mg total) by mouth daily., Disp: 90 tablet, Rfl: 3   baclofen  (LIORESAL ) 10 MG tablet, Take 1 tablet (10 mg  total) by mouth 2 (two) times daily as needed for muscle spasms., Disp: 60 tablet, Rfl: 2   beclomethasone (QVAR REDIHALER) 80 MCG/ACT inhaler, Inhalation, Disp: , Rfl:    benazepril -hydrochlorthiazide (LOTENSIN  HCT) 20-25 MG tablet, Take 1 tablet by mouth daily., Disp: 90 tablet, Rfl: 3   BREO ELLIPTA  100-25 MCG/ACT AEPB, Inhale 1 puff into the lungs daily., Disp: 30 each, Rfl: 2   budesonide (PULMICORT FLEXHALER) 180 MCG/ACT inhaler, Inhalation, Disp: , Rfl:    Calcium -Phosphorus-Vitamin D  (CITRACAL +D3 PO), Take 2 tablets by mouth  daily., Disp: , Rfl:    Cholecalciferol (VITAMIN D3) 50 MCG (2000 UT) TABS, Take 2,000 Units by mouth daily., Disp: , Rfl:    gabapentin  (NEURONTIN ) 100 MG capsule, Take 1 capsule (100 mg total) by mouth daily as needed. Take 1 capsule daily as needed for pain., Disp: 90 capsule, Rfl: 1   ibuprofen (ADVIL,MOTRIN) 200 MG tablet, Take 400 mg by mouth every 6 (six) hours as needed. For pain, Disp: , Rfl:    omeprazole  (PRILOSEC) 20 MG capsule, Take 1 capsule (20 mg total) by mouth daily., Disp: 90 capsule, Rfl: 3   alendronate  (FOSAMAX ) 70 MG tablet, Take 1 tablet (70 mg total) by mouth every 7 (seven) days. Take with a full glass of water on an empty stomach. (Patient not taking: Reported on 10/11/2024), Disp: 12 tablet, Rfl: 3  Current Facility-Administered Medications:    0.9 %  sodium chloride  infusion, 500 mL, Intravenous, Once, Avram Lupita BRAVO, MD "

## 2024-10-12 ENCOUNTER — Encounter: Payer: Self-pay | Admitting: Physician Assistant

## 2024-11-08 ENCOUNTER — Other Ambulatory Visit

## 2024-11-09 ENCOUNTER — Ambulatory Visit
Admission: RE | Admit: 2024-11-09 | Discharge: 2024-11-09 | Disposition: A | Source: Ambulatory Visit | Attending: Physician Assistant

## 2024-11-09 DIAGNOSIS — M79621 Pain in right upper arm: Secondary | ICD-10-CM

## 2024-11-11 ENCOUNTER — Other Ambulatory Visit

## 2024-11-23 ENCOUNTER — Other Ambulatory Visit: Payer: Self-pay | Admitting: Physician Assistant

## 2024-11-25 ENCOUNTER — Encounter: Payer: Self-pay | Admitting: Gastroenterology

## 2024-11-30 ENCOUNTER — Ambulatory Visit: Admitting: "Endocrinology

## 2024-12-15 ENCOUNTER — Encounter: Payer: Self-pay | Admitting: Physician Assistant

## 2025-02-02 ENCOUNTER — Ambulatory Visit
